# Patient Record
Sex: Female | Born: 1940 | Race: White | Hispanic: No | Marital: Married | Smoking: Former smoker
Health system: Southern US, Community
[De-identification: ages and names within clinical notes are randomized; demographics above are authoritative.]

## PROBLEM LIST (undated history)

## (undated) DIAGNOSIS — N390 Urinary tract infection, site not specified: Secondary | ICD-10-CM

## (undated) DIAGNOSIS — Z923 Personal history of irradiation: Secondary | ICD-10-CM

## (undated) DIAGNOSIS — B019 Varicella without complication: Secondary | ICD-10-CM

## (undated) DIAGNOSIS — K219 Gastro-esophageal reflux disease without esophagitis: Secondary | ICD-10-CM

## (undated) DIAGNOSIS — G4730 Sleep apnea, unspecified: Secondary | ICD-10-CM

## (undated) DIAGNOSIS — C801 Malignant (primary) neoplasm, unspecified: Secondary | ICD-10-CM

## (undated) DIAGNOSIS — M1990 Unspecified osteoarthritis, unspecified site: Secondary | ICD-10-CM

## (undated) DIAGNOSIS — B029 Zoster without complications: Secondary | ICD-10-CM

## (undated) HISTORY — PX: WISDOM TOOTH EXTRACTION: SHX21

## (undated) HISTORY — PX: HYSTEROSCOPY: SHX211

## (undated) HISTORY — DX: Urinary tract infection, site not specified: N39.0

## (undated) HISTORY — DX: Varicella without complication: B01.9

## (undated) HISTORY — DX: Zoster without complications: B02.9

## (undated) HISTORY — PX: COLONOSCOPY: SHX5424

## (undated) HISTORY — DX: Unspecified osteoarthritis, unspecified site: M19.90

---

## 2000-07-04 ENCOUNTER — Encounter: Admission: RE | Admit: 2000-07-04 | Discharge: 2000-07-04 | Payer: Self-pay | Admitting: Obstetrics and Gynecology

## 2000-07-04 ENCOUNTER — Encounter: Payer: Self-pay | Admitting: Obstetrics and Gynecology

## 2000-10-08 ENCOUNTER — Other Ambulatory Visit: Admission: RE | Admit: 2000-10-08 | Discharge: 2000-10-08 | Payer: Self-pay | Admitting: Obstetrics and Gynecology

## 2001-06-20 LAB — HM DEXA SCAN

## 2001-07-08 ENCOUNTER — Encounter: Admission: RE | Admit: 2001-07-08 | Discharge: 2001-07-08 | Payer: Self-pay | Admitting: Obstetrics and Gynecology

## 2001-07-08 ENCOUNTER — Encounter: Payer: Self-pay | Admitting: Obstetrics and Gynecology

## 2001-09-30 ENCOUNTER — Other Ambulatory Visit: Admission: RE | Admit: 2001-09-30 | Discharge: 2001-09-30 | Payer: Self-pay | Admitting: Internal Medicine

## 2002-07-15 ENCOUNTER — Encounter: Admission: RE | Admit: 2002-07-15 | Discharge: 2002-07-15 | Payer: Self-pay | Admitting: Gynecology

## 2002-07-15 ENCOUNTER — Encounter: Payer: Self-pay | Admitting: Gynecology

## 2002-07-21 DIAGNOSIS — B029 Zoster without complications: Secondary | ICD-10-CM

## 2002-07-21 HISTORY — DX: Zoster without complications: B02.9

## 2002-10-08 ENCOUNTER — Other Ambulatory Visit: Admission: RE | Admit: 2002-10-08 | Discharge: 2002-10-08 | Payer: Self-pay | Admitting: Gynecology

## 2003-04-07 ENCOUNTER — Ambulatory Visit (HOSPITAL_COMMUNITY): Admission: RE | Admit: 2003-04-07 | Discharge: 2003-04-07 | Payer: Self-pay | Admitting: Internal Medicine

## 2003-07-17 ENCOUNTER — Encounter: Admission: RE | Admit: 2003-07-17 | Discharge: 2003-07-17 | Payer: Self-pay | Admitting: Gynecology

## 2003-07-17 ENCOUNTER — Encounter: Payer: Self-pay | Admitting: Gynecology

## 2003-10-13 ENCOUNTER — Other Ambulatory Visit: Admission: RE | Admit: 2003-10-13 | Discharge: 2003-10-13 | Payer: Self-pay | Admitting: Gynecology

## 2004-07-19 ENCOUNTER — Ambulatory Visit (HOSPITAL_COMMUNITY): Admission: RE | Admit: 2004-07-19 | Discharge: 2004-07-19 | Payer: Self-pay | Admitting: Gynecology

## 2004-10-17 ENCOUNTER — Other Ambulatory Visit: Admission: RE | Admit: 2004-10-17 | Discharge: 2004-10-17 | Payer: Self-pay | Admitting: Gynecology

## 2005-08-01 ENCOUNTER — Ambulatory Visit (HOSPITAL_COMMUNITY): Admission: RE | Admit: 2005-08-01 | Discharge: 2005-08-01 | Payer: Self-pay | Admitting: Gynecology

## 2005-09-13 ENCOUNTER — Ambulatory Visit (HOSPITAL_COMMUNITY): Admission: RE | Admit: 2005-09-13 | Discharge: 2005-09-13 | Payer: Self-pay | Admitting: Gynecology

## 2005-10-25 ENCOUNTER — Other Ambulatory Visit: Admission: RE | Admit: 2005-10-25 | Discharge: 2005-10-25 | Payer: Self-pay | Admitting: Gynecology

## 2006-09-17 ENCOUNTER — Ambulatory Visit (HOSPITAL_COMMUNITY): Admission: RE | Admit: 2006-09-17 | Discharge: 2006-09-17 | Payer: Self-pay | Admitting: Gynecology

## 2006-10-30 ENCOUNTER — Other Ambulatory Visit: Admission: RE | Admit: 2006-10-30 | Discharge: 2006-10-30 | Payer: Self-pay | Admitting: Gynecology

## 2007-09-19 ENCOUNTER — Ambulatory Visit (HOSPITAL_COMMUNITY): Admission: RE | Admit: 2007-09-19 | Discharge: 2007-09-19 | Payer: Self-pay | Admitting: Gynecology

## 2007-11-05 ENCOUNTER — Other Ambulatory Visit: Admission: RE | Admit: 2007-11-05 | Discharge: 2007-11-05 | Payer: Self-pay | Admitting: Gynecology

## 2008-05-28 ENCOUNTER — Ambulatory Visit (HOSPITAL_BASED_OUTPATIENT_CLINIC_OR_DEPARTMENT_OTHER): Admission: RE | Admit: 2008-05-28 | Discharge: 2008-05-28 | Payer: Self-pay | Admitting: Gynecology

## 2008-05-28 ENCOUNTER — Encounter (INDEPENDENT_AMBULATORY_CARE_PROVIDER_SITE_OTHER): Payer: Self-pay | Admitting: Gynecology

## 2008-10-06 ENCOUNTER — Ambulatory Visit (HOSPITAL_COMMUNITY): Admission: RE | Admit: 2008-10-06 | Discharge: 2008-10-06 | Payer: Self-pay | Admitting: Gynecology

## 2009-10-12 ENCOUNTER — Ambulatory Visit (HOSPITAL_COMMUNITY): Admission: RE | Admit: 2009-10-12 | Discharge: 2009-10-12 | Payer: Self-pay | Admitting: Gynecology

## 2010-01-07 ENCOUNTER — Ambulatory Visit (HOSPITAL_COMMUNITY): Admission: RE | Admit: 2010-01-07 | Discharge: 2010-01-07 | Payer: Self-pay | Admitting: Orthopaedic Surgery

## 2010-10-20 ENCOUNTER — Ambulatory Visit (HOSPITAL_COMMUNITY)
Admission: RE | Admit: 2010-10-20 | Discharge: 2010-10-20 | Payer: Self-pay | Source: Home / Self Care | Admitting: Gynecology

## 2010-12-20 ENCOUNTER — Other Ambulatory Visit: Payer: Self-pay | Admitting: Gynecology

## 2011-04-04 NOTE — Op Note (Signed)
NAMERosalio [name], [name]              ACCOUNT NO.:  [id number]   MEDICAL RECORD NO.:  [id number]          PATIENT TYPE:  AMB   LOCATION:  [facility]                         FACILITY:  [facility]   PHYSICIAN:  Gretta Cool, M.D. DATE OF BIRTH:  [birth year]   DATE OF PROCEDURE:  05/28/2008  DATE OF DISCHARGE:                               OPERATIVE REPORT   PREOPERATIVE DIAGNOSIS:  Endometrial polyps, with abnormal uterine  bleeding.   POSTOPERATIVE DIAGNOSIS:  Endometrial polyps, with abnormal uterine  bleeding.   PROCEDURE:  Hysteroscopy, resection of endometrial polyps, total  endometrial resection per ablation plus VaporTrode ablation.   ANESTHESIA:  IV sedation, and paracervical block.   BRIEF HISTORY:  A 70 year old white female, postmenopausal, with  postmenopausal bleeding and endometrial thickening by ultrasound.  She  had office endometrial sampling that revealed no evidence of endometrial  cancer.  She now presents for hysteroscopy and resection of endometrial  polyps and total endometrial resection for ablation.   DESCRIPTION OF PROCEDURE:  Under excellent anesthesia as above, with the  patient prepped and draped in lithotomy position in [place] stirrups, her  bladder was drained with in-and-out catheter.  Her cervix was then  grasped with a single-tooth tenaculum and progressively dilated to  accommodate the 7 mm resectoscope.  The resectoscope was then introduced  and the cavity photographed.  There were two areas of polyp-like  formation, one on the anterior wall and one down near the cervical  opening.  Those areas were resected first and submitted.  The entire  endometrial cavity was then systematically resected.  There was evidence  of significant extension of endometrium out into the superficial  myometrium.  The resection was carried deeply, and then VaporTrode  applied to eliminate any further islands of endometrial tissue in the  superficial myometrium.  The entire  cavity was treated with the  VaporTrode, the cornual areas by touch technique.  At the end of the  procedure, the entire cavity had been treated completely.  There were no  complications.  The patient returned to the recovery room in excellent  condition.  No significant blood loss.  No complications.           ______________________________  Gretta Cool, M.D.     CWL/MEDQ  D:  05/28/2008  T:  05/28/2008  Job:  [id number]

## 2011-04-07 NOTE — Op Note (Signed)
   NAME:  Elizabeth [name]                        ACCOUNT NO.:  [id number]   MEDICAL RECORD NO.:  [id number]                   PATIENT TYPE:  AMB   LOCATION:  DAY                                  FACILITY:  [facility]   PHYSICIAN:  R. Roetta Sessions, M.[name].              DATE OF BIRTH:  [birth year]   DATE OF PROCEDURE:  04/07/2003  DATE OF DISCHARGE:                                 OPERATIVE REPORT   PROCEDURE:  Screening colonoscopy.   INDICATIONS FOR PROCEDURE:  The patient is a 70 year old lady devoid of any  lower GI tract symptoms, no prior colon imaging, no family history of  colorectal neoplasia who is referred for colorectal cancer screening.  Colonoscopy is now being done as a standard screening maneuver. This  approach has been discussed with the patient at length at the bedside. The  potential risks, benefits, and alternatives have been reviewed, questions  answered. Please see my handwritten H&P for more information.   MONITORING:  O2 saturation, blood pressure, pulse and respirations were  monitored throughout the entire procedure.   CONSCIOUS SEDATION:  Versed 3 mg IV, Demerol 75 mg IV in divided doses.   INSTRUMENT:  Olympus video chip pediatric colonoscope.   FINDINGS:  Digital rectal exam revealed no abnormalities.   ENDOSCOPIC FINDINGS:  The prep was excellent.   RECTUM:  Examination of the rectal mucosa including retroflexed view of the  anal verge revealed no abnormalities.   COLON:  The colonic mucosa was surveyed from the rectosigmoid junction  through the left transverse right colon to the area of the appendiceal  orifice, ileocecal valve and cecum. These structures were well seen and  photographed for the record. The colonic mucosa all the way to the cecum  appeared normal. From the level of the cecum and ileocecal valve, the scope  was slowly and cautiously withdrawn and all previously mentioned mucosal  surfaces were again seen and again no other abnormalities  were observed. The  patient tolerated the procedure well and was reacted in endoscopy.    IMPRESSION:  1. Normal rectum.  2. Normal colon.   RECOMMENDATIONS:  Repeat colonoscopy in 10 years.                                               Elizabeth [name], M.[name].    RMR/MEDQ  [name]:  04/07/2003  T:  04/07/2003  Job:  [id number]   cc:   Elizabeth [name], M.[name].  [address]  [place]  [place] [address]  Fax: [phone]

## 2011-10-10 ENCOUNTER — Other Ambulatory Visit: Payer: Self-pay | Admitting: Gynecology

## 2011-10-10 DIAGNOSIS — Z1231 Encounter for screening mammogram for malignant neoplasm of breast: Secondary | ICD-10-CM

## 2011-11-03 ENCOUNTER — Ambulatory Visit
Admission: RE | Admit: 2011-11-03 | Discharge: 2011-11-03 | Disposition: A | Discharge status: Home / Self Care | Payer: PRIVATE HEALTH INSURANCE | Source: Ambulatory Visit | Attending: Gynecology | Admitting: Gynecology

## 2011-11-03 DIAGNOSIS — Z1231 Encounter for screening mammogram for malignant neoplasm of breast: Secondary | ICD-10-CM

## 2012-01-09 ENCOUNTER — Other Ambulatory Visit: Payer: Self-pay | Admitting: Gynecology

## 2012-10-15 ENCOUNTER — Other Ambulatory Visit: Payer: Self-pay | Admitting: Gynecology

## 2012-10-15 DIAGNOSIS — Z1231 Encounter for screening mammogram for malignant neoplasm of breast: Secondary | ICD-10-CM

## 2012-11-20 LAB — HM MAMMOGRAPHY: HM Mammogram: NORMAL

## 2012-11-25 ENCOUNTER — Ambulatory Visit
Admission: RE | Admit: 2012-11-25 | Discharge: 2012-11-25 | Disposition: A | Discharge status: Home / Self Care | Payer: Medicare Other | Source: Ambulatory Visit | Attending: Gynecology | Admitting: Gynecology

## 2012-11-25 DIAGNOSIS — Z1231 Encounter for screening mammogram for malignant neoplasm of breast: Secondary | ICD-10-CM

## 2012-12-26 ENCOUNTER — Encounter: Payer: Self-pay | Admitting: Family Medicine

## 2012-12-26 ENCOUNTER — Ambulatory Visit (INDEPENDENT_AMBULATORY_CARE_PROVIDER_SITE_OTHER): Payer: Medicare Other | Admitting: Family Medicine

## 2012-12-26 VITALS — BP 128/80 | HR 72 | Temp 97.8°F | Ht 67.25 in | Wt 145.8 lb

## 2012-12-26 DIAGNOSIS — Z79890 Hormone replacement therapy (postmenopausal): Secondary | ICD-10-CM | POA: Insufficient documentation

## 2012-12-26 DIAGNOSIS — R739 Hyperglycemia, unspecified: Secondary | ICD-10-CM | POA: Insufficient documentation

## 2012-12-26 DIAGNOSIS — M549 Dorsalgia, unspecified: Secondary | ICD-10-CM

## 2012-12-26 DIAGNOSIS — G8929 Other chronic pain: Secondary | ICD-10-CM

## 2012-12-26 DIAGNOSIS — R7309 Other abnormal glucose: Secondary | ICD-10-CM

## 2012-12-26 NOTE — Patient Instructions (Addendum)
Schedule your complete physical for October Think of [place] as your home [place] Call with any questions or concerns Keep up the good work!  You look great! Welcome!  We're glad to have you!!!

## 2012-12-26 NOTE — Assessment & Plan Note (Signed)
New to provider.  Currently well controlled on diclofenac and w/ chiropractic and yoga.  Will follow.

## 2012-12-26 NOTE — Progress Notes (Signed)
  Subjective:    Patient ID: Elizabeth [name], female    DOB: 31-Aug-1941, 72 y.o.   MRN: [id number]  HPI New to establish.  Previous MD- Gerda Diss.  GYN- [place].  UTD on mammo and pap.  Colon cancer screening will be due later this year or next Sidney Ace, [place])  HRT- currently being prescribed by Dr Nicholas Lose, on Estrace Cream, Vivelle Dot, Prometrium.  Pt tried to wean and 'i did not have a good night sleep for 3 yrs'.  Elevated glucose- pt had 1 episode of elevated glucose on fasting labs.  Had glucose tolerance test w/ prior PCP- normal.  Will randomly check fasting sugars throughout the year, ranging 89-110.  + family hx of DM- mom.  Exercising regularly- 'at least 3 days/week'.  Back pain- chronic problem, controlled w/ Diclofenac and chiropractic.  Doing yoga twice weekly.  Pt has not had cholesterol or lab work done in 2 yrs- annual exams in Oct.   Review of Systems For ROS see HPI     Objective:   Physical Exam  Vitals reviewed. Constitutional: She is oriented to person, place, and time. She appears well-developed and well-nourished. No distress.  HENT:  Head: Normocephalic and atraumatic.  Eyes: Conjunctivae normal and EOM are normal. Pupils are equal, round, and reactive to light.  Neck: Normal range of motion. Neck supple. No thyromegaly present.  Cardiovascular: Normal rate, regular rhythm, normal heart sounds and intact distal pulses.   No murmur heard. Pulmonary/Chest: Effort normal and breath sounds normal. No respiratory distress.  Abdominal: Soft. She exhibits no distension. There is no tenderness.  Musculoskeletal: She exhibits no edema.  Lymphadenopathy:    She has no cervical adenopathy.  Neurological: She is alert and oriented to person, place, and time.  Skin: Skin is warm and dry.  Psychiatric: She has a normal mood and affect. Her behavior is normal.          Assessment & Plan:

## 2012-12-26 NOTE — Assessment & Plan Note (Signed)
New.  Pt being managed by GYN.  UTD on mammo.  Discussed potential of elevated lipids on HRT.  Will check at this year's CPE.

## 2012-12-26 NOTE — Assessment & Plan Note (Signed)
New to provider.  Pt has been monitoring CBGs randomly since labs first elevated.  According to pt's home readings, this is well controlled.  Check labs to assess.  Discussed importance of low carb diet and continued exercise.  Will follow.

## 2013-01-27 ENCOUNTER — Other Ambulatory Visit: Payer: Self-pay | Admitting: Gynecology

## 2013-04-03 ENCOUNTER — Telehealth: Payer: Self-pay

## 2013-04-03 NOTE — Telephone Encounter (Signed)
Pt called and said that she received a letter to call and schedule next colonoscopy. She said she has remarried and moved to [facility] and she will be following up there with GI. She said Thanks for all of the care here.

## 2013-04-03 NOTE — Telephone Encounter (Signed)
Noted  

## 2013-09-11 ENCOUNTER — Encounter: Payer: Medicare Other | Admitting: Family Medicine

## 2013-10-07 ENCOUNTER — Telehealth: Payer: Self-pay

## 2013-10-07 NOTE — Telephone Encounter (Addendum)
Left message for call back Non identifiable Medication and allergies: reviewed and updated  90 day supply/mail order: na [facility] pharmacy: [facility] [address]   Immunizations due: UTD    A/P:   No changes to FH or [facility]  Flu--08/2013 Shingles--07/2008 Pap--neg--01/2013 MMG--11/2012--neg Bone Density--06/2001  To Discuss with Provider: Due for CCS--referral please

## 2013-10-08 ENCOUNTER — Encounter: Payer: Self-pay | Admitting: Family Medicine

## 2013-10-08 ENCOUNTER — Ambulatory Visit (INDEPENDENT_AMBULATORY_CARE_PROVIDER_SITE_OTHER): Payer: Medicare Other | Admitting: Family Medicine

## 2013-10-08 ENCOUNTER — Other Ambulatory Visit: Payer: Self-pay | Admitting: General Practice

## 2013-10-08 VITALS — BP 126/80 | HR 70 | Temp 97.9°F | Resp 16 | Ht 67.0 in | Wt 142.4 lb

## 2013-10-08 DIAGNOSIS — Z79890 Hormone replacement therapy (postmenopausal): Secondary | ICD-10-CM

## 2013-10-08 DIAGNOSIS — Z0000 Encounter for general adult medical examination without abnormal findings: Secondary | ICD-10-CM

## 2013-10-08 DIAGNOSIS — Z23 Encounter for immunization: Secondary | ICD-10-CM

## 2013-10-08 DIAGNOSIS — Z1211 Encounter for screening for malignant neoplasm of colon: Secondary | ICD-10-CM

## 2013-10-08 DIAGNOSIS — R7309 Other abnormal glucose: Secondary | ICD-10-CM

## 2013-10-08 DIAGNOSIS — R739 Hyperglycemia, unspecified: Secondary | ICD-10-CM

## 2013-10-08 LAB — BASIC METABOLIC PANEL
BUN: 9 mg/dL (ref 6–23)
CO2: 26 mEq/L (ref 19–32)
Calcium: 9.7 mg/dL (ref 8.4–10.5)
Chloride: 103 mEq/L (ref 96–112)
Creatinine, Ser: 0.8 mg/dL (ref 0.4–1.2)
GFR: 77.42 mL/min (ref >60.00)
Glucose, Bld: 112 mg/dL — ABNORMAL HIGH (ref 70–99)
Potassium: 3.8 mEq/L (ref 3.5–5.1)
Sodium: 134 mEq/L — ABNORMAL LOW (ref 135–145)

## 2013-10-08 LAB — CBC WITH DIFFERENTIAL/PLATELET
Eosinophils Absolute: 0.1 10*3/uL (ref 0.0–0.7)
Hemoglobin: 12.8 g/dL (ref 12.0–15.0)
Lymphs Abs: 0.8 10*3/uL (ref 0.7–4.0)
MCHC: 34.3 g/dL (ref 30.0–36.0)
MCV: 95.3 fl (ref 78.0–100.0)
Monocytes Absolute: 0.4 10*3/uL (ref 0.1–1.0)
Monocytes Relative: 9 % (ref 3.0–12.0)
Neutro Abs: 3.2 10*3/uL (ref 1.4–7.7)
RBC: 3.93 Mil/uL (ref 3.87–5.11)
RDW: 12.9 % (ref 11.5–14.6)
WBC: 4.5 10*3/uL (ref 4.5–10.5)

## 2013-10-08 LAB — HEPATIC FUNCTION PANEL
ALT: 15 U/L (ref 0–35)
AST: 17 U/L (ref 0–37)
Alkaline Phosphatase: 50 U/L (ref 39–117)
Total Bilirubin: 0.6 mg/dL (ref 0.3–1.2)

## 2013-10-08 LAB — LIPID PANEL
Cholesterol: 190 mg/dL (ref 0–200)
HDL: 76.4 mg/dL (ref >39.00)
LDL Cholesterol: 97 mg/dL (ref 0–99)
Total CHOL/HDL Ratio: 2
Triglycerides: 83 mg/dL (ref 0.0–149.0)
VLDL: 16.6 mg/dL (ref 0.0–40.0)

## 2013-10-08 MED ORDER — DICLOFENAC SODIUM 75 MG PO TBEC: 75.0000 mg | DELAYED_RELEASE_TABLET | Freq: Every day | ORAL | Status: DC

## 2013-10-08 NOTE — Assessment & Plan Note (Signed)
Check labs.  Treat prn. 

## 2013-10-08 NOTE — Assessment & Plan Note (Signed)
Pt UTD on mammo.  Check labs as meds can elevate lipids and sugar.  Will continue to follow.

## 2013-10-08 NOTE — Addendum Note (Signed)
Addended by: Jackson Latino on: 10/08/2013 10:53 AM   Modules accepted: Orders

## 2013-10-08 NOTE — Progress Notes (Signed)
  Subjective:    Patient ID: Elizabeth [name], female    DOB: 03/24/1941, 71 y.o.   MRN: [id number]  HPI Pre visit review using our clinic review tool, if applicable. No additional management support is needed unless otherwise documented below in the visit note.   Here today for CPE.  Risk Factors: HRT- chronic for pt, initiated by GYN.  UTD on mammo.   Physical Activity: exercising regularly, does treadmill and elliptical 3x/week, yoga Fall Risk: low risk Depression: no current sxs Hearing: normal to conversational tones and whispered voice w/ hearing aides ADL's: independent Cognitive: normal linear thought process, memory and attention intact Home Safety: safe at home Height, Weight, BMI, Visual Acuity: see vitals, vision corrected to 20/20 w/ glasses Counseling: UTD on mammo [facility]), due for colonoscopy, no longer needs paps (had pap in March), due for DEXA. Labs Ordered: See A&P Care Plan: See A&P    Review of Systems Patient reports no vision/ hearing changes, adenopathy,fever, weight change,  persistant/recurrent hoarseness , swallowing issues, chest pain, palpitations, edema, persistant/recurrent cough, hemoptysis, dyspnea (rest/exertional/paroxysmal nocturnal), gastrointestinal bleeding (melena, rectal bleeding), abdominal pain, significant heartburn, bowel changes, GU symptoms (dysuria, hematuria, incontinence), Gyn symptoms (abnormal  bleeding, pain),  syncope, focal weakness, memory loss, numbness & tingling, skin/hair/nail changes, abnormal bruising or bleeding, anxiety, or depression.     Objective:   Physical Exam General Appearance:    Alert, cooperative, no distress, appears stated age  Head:    Normocephalic, without obvious abnormality, atraumatic  Eyes:    PERRL, conjunctiva/corneas clear, EOM's intact, fundi    benign, both eyes  Ears:    Normal TM's and external ear canals, both ears  Nose:   Nares normal, septum midline, mucosa normal, no  drainage    or sinus tenderness  Throat:   Lips, mucosa, and tongue normal; teeth and gums normal  Neck:   Supple, symmetrical, trachea midline, no adenopathy;    Thyroid: no enlargement/tenderness/nodules  Back:     Symmetric, no curvature, ROM normal, no CVA tenderness  Lungs:     Clear to auscultation bilaterally, respirations unlabored  Chest Wall:    No tenderness or deformity   Heart:    Regular rate and rhythm, S1 and S2 normal, no murmur, rub   or gallop  Breast Exam:    Deferred to mammo  Abdomen:     Soft, non-tender, bowel sounds active all four quadrants,    no masses, no organomegaly  Genitalia:    Deferred to GYN  Rectal:    Extremities:   Extremities normal, atraumatic, no cyanosis or edema  Pulses:   2+ and symmetric all extremities  Skin:   Skin color, texture, turgor normal, no rashes or lesions  Lymph nodes:   Cervical, supraclavicular, and axillary nodes normal  Neurologic:   CNII-XII intact, normal strength, sensation and reflexes    throughout          Assessment & Plan:

## 2013-10-08 NOTE — Assessment & Plan Note (Signed)
Pt's PE WNL.  UTD on mammo.  Due for DEXA and colonoscopy- referrals made.  Check labs.  EKG done- see document for interpretation.  Anticipatory guidance provided.

## 2013-10-08 NOTE — Patient Instructions (Addendum)
Follow up in 1 year or as needed We'll notify you of your lab results and make any changes if needed Keep up the good work!  You look great! We'll call you with your bone density and GI appts Call with any questions or concerns Happy Holidays!

## 2013-10-09 ENCOUNTER — Other Ambulatory Visit: Payer: Self-pay | Admitting: Family Medicine

## 2013-10-09 ENCOUNTER — Ambulatory Visit: Payer: Medicare Other

## 2013-10-09 DIAGNOSIS — R7309 Other abnormal glucose: Secondary | ICD-10-CM

## 2013-10-09 DIAGNOSIS — E2839 Other primary ovarian failure: Secondary | ICD-10-CM

## 2013-10-09 LAB — HEMOGLOBIN A1C
Hgb A1c MFr Bld: 4.1 % (ref <5.7)
Mean Plasma Glucose: 71 mg/dL (ref <117)

## 2013-10-13 ENCOUNTER — Other Ambulatory Visit: Payer: Self-pay | Admitting: Family Medicine

## 2013-10-13 DIAGNOSIS — Z1231 Encounter for screening mammogram for malignant neoplasm of breast: Secondary | ICD-10-CM

## 2013-11-06 ENCOUNTER — Encounter: Payer: Self-pay | Admitting: Internal Medicine

## 2013-11-24 ENCOUNTER — Other Ambulatory Visit: Payer: Self-pay | Admitting: Family Medicine

## 2013-11-24 ENCOUNTER — Other Ambulatory Visit: Payer: Self-pay

## 2013-11-24 DIAGNOSIS — E2839 Other primary ovarian failure: Secondary | ICD-10-CM

## 2013-11-26 ENCOUNTER — Ambulatory Visit
Admission: RE | Admit: 2013-11-26 | Discharge: 2013-11-26 | Disposition: A | Discharge status: Home / Self Care | Payer: Medicare Other | Source: Ambulatory Visit | Attending: Family Medicine | Admitting: Family Medicine

## 2013-11-26 ENCOUNTER — Encounter: Payer: Self-pay | Admitting: General Practice

## 2013-11-26 DIAGNOSIS — E2839 Other primary ovarian failure: Secondary | ICD-10-CM

## 2013-11-26 DIAGNOSIS — Z1231 Encounter for screening mammogram for malignant neoplasm of breast: Secondary | ICD-10-CM

## 2013-11-26 LAB — HM DEXA SCAN: HM DEXA SCAN: NORMAL

## 2013-12-02 ENCOUNTER — Other Ambulatory Visit: Payer: Self-pay | Admitting: Family Medicine

## 2013-12-02 DIAGNOSIS — R928 Other abnormal and inconclusive findings on diagnostic imaging of breast: Secondary | ICD-10-CM

## 2013-12-08 ENCOUNTER — Other Ambulatory Visit: Payer: Self-pay

## 2013-12-08 ENCOUNTER — Other Ambulatory Visit: Payer: Self-pay | Admitting: Family Medicine

## 2013-12-08 DIAGNOSIS — B351 Tinea unguium: Secondary | ICD-10-CM

## 2013-12-08 DIAGNOSIS — R928 Other abnormal and inconclusive findings on diagnostic imaging of breast: Secondary | ICD-10-CM

## 2013-12-09 ENCOUNTER — Ambulatory Visit
Admission: RE | Admit: 2013-12-09 | Discharge: 2013-12-09 | Disposition: A | Discharge status: Home / Self Care | Payer: Medicare Other | Source: Ambulatory Visit | Attending: Family Medicine | Admitting: Family Medicine

## 2013-12-09 DIAGNOSIS — R928 Other abnormal and inconclusive findings on diagnostic imaging of breast: Secondary | ICD-10-CM

## 2013-12-22 ENCOUNTER — Ambulatory Visit (AMBULATORY_SURGERY_CENTER): Payer: Self-pay

## 2013-12-22 VITALS — Ht 67.25 in | Wt 144.0 lb

## 2013-12-22 DIAGNOSIS — Z1211 Encounter for screening for malignant neoplasm of colon: Secondary | ICD-10-CM

## 2013-12-22 MED ORDER — MOVIPREP 100 G PO SOLR
1.0000 | Freq: Once | ORAL | Status: DC
Start: 2013-12-22 — End: 2014-01-05

## 2013-12-25 ENCOUNTER — Encounter: Payer: Self-pay | Admitting: Internal Medicine

## 2014-01-05 ENCOUNTER — Ambulatory Visit (AMBULATORY_SURGERY_CENTER): Payer: Medicare Other | Admitting: Internal Medicine

## 2014-01-05 ENCOUNTER — Encounter: Payer: Self-pay | Admitting: Internal Medicine

## 2014-01-05 VITALS — BP 122/84 | HR 66 | Temp 97.5°F | Resp 23 | Ht 67.0 in | Wt 144.0 lb

## 2014-01-05 DIAGNOSIS — Z1211 Encounter for screening for malignant neoplasm of colon: Secondary | ICD-10-CM

## 2014-01-05 MED ORDER — SODIUM CHLORIDE 0.9 % IV SOLN: 500.0000 mL | INTRAVENOUS | Status: DC

## 2014-01-05 NOTE — Progress Notes (Signed)
Procedure ends, to recovery, report given and VSS. 

## 2014-01-05 NOTE — Patient Instructions (Signed)
YOU HAD AN ENDOSCOPIC PROCEDURE TODAY AT [facility]: Refer to the procedure report that was given to you for any specific questions about what was found during the examination.  If the procedure report does not answer your questions, please call your gastroenterologist to clarify.  If you requested that your care partner not be given the details of your procedure findings, then the procedure report has been included in a sealed envelope for you to review at your convenience later.  YOU SHOULD EXPECT: Some feelings of bloating in the abdomen. Passage of more gas than usual.  Walking can help get rid of the air that was put into your GI tract during the procedure and reduce the bloating. If you had a lower endoscopy (such as a colonoscopy or flexible sigmoidoscopy) you may notice spotting of blood in your stool or on the toilet paper. If you underwent a bowel prep for your procedure, then you may not have a normal bowel movement for a few days.  DIET: Your first meal following the procedure should be a light meal and then it is ok to progress to your normal diet.  A half-sandwich or bowl of soup is an example of a good first meal.  Heavy or fried foods are harder to digest and may make you feel nauseous or bloated.  Likewise meals heavy in dairy and vegetables can cause extra gas to form and this can also increase the bloating.  Drink plenty of fluids but you should avoid alcoholic beverages for 24 hours.  ACTIVITY: Your care partner should take you home directly after the procedure.  You should plan to take it easy, moving slowly for the rest of the day.  You can resume normal activity the day after the procedure however you should NOT DRIVE or use heavy machinery for 24 hours (because of the sedation medicines used during the test).    SYMPTOMS TO REPORT IMMEDIATELY: A gastroenterologist can be reached at any hour.  During normal business hours, 8:30 AM to 5:00 PM Monday through Friday,  call [phone].  After hours and on weekends, please call the GI answering service at [phone]  Emergency number who will take a message and have the physician on call contact you.   Following lower endoscopy (colonoscopy or flexible sigmoidoscopy):  Excessive amounts of blood in the stool  Significant tenderness or worsening of abdominal pains  Swelling of the abdomen that is new, acute  Fever of 100F or higher  FOLLOW UP: If any biopsies were taken you will be contacted by phone or by letter within the next 1-3 weeks.  Call your gastroenterologist if you have not heard about the biopsies in 3 weeks.  Our staff will call the home number listed on your records the next business day following your procedure to check on you and address any questions or concerns that you may have at that time regarding the information given to you following your procedure. This is a courtesy call and so if there is no answer at the home number and we have not heard from you through the emergency physician on call, we will assume that you have returned to your regular daily activities without incident.  SIGNATURES/CONFIDENTIALITY: You and/or your care partner have signed paperwork which will be entered into your electronic medical record.  These signatures attest to the fact that that the information above on your After Visit Summary has been reviewed and is understood.  Full responsibility of the  confidentiality of this discharge information lies with you and/or your care-partner.  Handout on diverticulosis , high fiber diet repeat colon in 10 years

## 2014-01-05 NOTE — Op Note (Signed)
[place]  [address] [place], [id number]   COLONOSCOPY PROCEDURE REPORT  PATIENT: Elizabeth [name], Elizabeth [name]  MR#: [id number] BIRTHDATE: [birth year] , 73  yrs. old GENDER: Female ENDOSCOPIST: Eustace Quail, MD REFERRED TG:GYIRSWNIO Birdie Riddle, M.D. PROCEDURE DATE:  01/05/2014 PROCEDURE:   Colonoscopy, screening First Screening Colonoscopy - Avg.  risk and is 50 yrs.  old or older - No.  Prior Negative Screening - Now for repeat screening. 10 or more years since last screening  History of Adenoma - Now for follow-up colonoscopy & has been > or = to 3 yrs.  N/A  Polyps Removed Today? No.  Recommend repeat exam, <10 yrs? No. ASA CLASS:   Class I INDICATIONS:average risk screening.   Index exam elsewhere 11 years ago without polyps. MEDICATIONS: MAC sedation, administered by CRNA and propofol (Diprivan) 200mg  IV  DESCRIPTION OF PROCEDURE:   After the risks benefits and alternatives of the procedure were thoroughly explained, informed consent was obtained.  A digital rectal exam revealed no abnormalities of the rectum.   The LB [id number]  endoscope was introduced through the anus and advanced to the cecum, which was identified by both the appendix and ileocecal valve. No adverse events experienced.   The quality of the prep was excellent, using MoviPrep  The instrument was then slowly withdrawn as the colon was fully examined.      COLON FINDINGS: Moderate diverticulosis was noted The finding was in the left colon.   The colon was otherwise normal.  There was no inflammation, polyps or cancers .  Retroflexed views revealed internal hemorrhoids. The time to cecum=3 minutes 16 seconds. Withdrawal time=8 minutes 20 seconds.  The scope was withdrawn and the procedure completed.  COMPLICATIONS: There were no complications.  ENDOSCOPIC IMPRESSION: 1.   Moderate diverticulosis was noted in the left colon 2.   The colon was otherwise  normal  RECOMMENDATIONS: 1. Continue current colorectal screening recommendations for "routine risk" patients with a repeat colonoscopy in 10 years.   eSigned:  Eustace Quail, MD 01/05/2014 10:50 AM   cc: Midge Minium, MD and The Patient

## 2014-01-06 ENCOUNTER — Telehealth: Payer: Self-pay | Admitting: *Deleted

## 2014-01-06 NOTE — Telephone Encounter (Signed)
  Follow up Call-  Call back number 01/05/2014  Post procedure Call Back phone  # [phone]  Permission to leave phone message Yes     Patient questions:  Do you have a fever, pain , or abdominal swelling? no Pain Score  0 *  Have you tolerated food without any problems? yes  Have you been able to return to your normal activities? yes  Do you have any questions about your discharge instructions: Diet   no Medications  no Follow up visit  no  Do you have questions or concerns about your Care? no  Actions: * If pain score is 4 or above: No action needed, pain <4.

## 2014-01-13 ENCOUNTER — Encounter: Payer: Self-pay | Admitting: General Practice

## 2014-04-14 DIAGNOSIS — B351 Tinea unguium: Secondary | ICD-10-CM

## 2014-05-05 ENCOUNTER — Other Ambulatory Visit: Payer: Self-pay | Admitting: Family Medicine

## 2014-05-05 ENCOUNTER — Other Ambulatory Visit: Payer: Self-pay

## 2014-05-05 DIAGNOSIS — N6489 Other specified disorders of breast: Secondary | ICD-10-CM

## 2014-06-08 ENCOUNTER — Ambulatory Visit
Admission: RE | Admit: 2014-06-08 | Discharge: 2014-06-08 | Disposition: A | Discharge status: Home / Self Care | Payer: Medicare Other | Source: Ambulatory Visit | Attending: Family Medicine | Admitting: Family Medicine

## 2014-06-08 DIAGNOSIS — N6489 Other specified disorders of breast: Secondary | ICD-10-CM

## 2014-07-21 DIAGNOSIS — B351 Tinea unguium: Secondary | ICD-10-CM

## 2014-10-07 ENCOUNTER — Other Ambulatory Visit: Payer: Self-pay | Admitting: Family Medicine

## 2014-11-05 ENCOUNTER — Other Ambulatory Visit: Payer: Self-pay | Admitting: Family Medicine

## 2014-11-05 DIAGNOSIS — N6489 Other specified disorders of breast: Secondary | ICD-10-CM

## 2014-11-16 ENCOUNTER — Encounter: Payer: Medicare Other | Admitting: Family Medicine

## 2014-11-23 ENCOUNTER — Other Ambulatory Visit: Payer: Self-pay | Admitting: Family Medicine

## 2014-11-23 ENCOUNTER — Other Ambulatory Visit: Payer: Self-pay

## 2014-11-23 DIAGNOSIS — N6489 Other specified disorders of breast: Secondary | ICD-10-CM

## 2014-11-27 ENCOUNTER — Ambulatory Visit
Admission: RE | Admit: 2014-11-27 | Discharge: 2014-11-27 | Disposition: A | Discharge status: Home / Self Care | Payer: Medicare Other | Source: Ambulatory Visit | Attending: Family Medicine | Admitting: Family Medicine

## 2014-11-27 DIAGNOSIS — N6489 Other specified disorders of breast: Secondary | ICD-10-CM

## 2014-12-17 ENCOUNTER — Encounter: Payer: Self-pay | Admitting: Family Medicine

## 2014-12-17 ENCOUNTER — Ambulatory Visit (INDEPENDENT_AMBULATORY_CARE_PROVIDER_SITE_OTHER): Payer: Medicare Other | Admitting: Family Medicine

## 2014-12-17 VITALS — BP 130/78 | HR 74 | Temp 98.0°F | Resp 16 | Ht 67.0 in | Wt 150.1 lb

## 2014-12-17 DIAGNOSIS — Z0000 Encounter for general adult medical examination without abnormal findings: Secondary | ICD-10-CM

## 2014-12-17 DIAGNOSIS — Z23 Encounter for immunization: Secondary | ICD-10-CM

## 2014-12-17 NOTE — Patient Instructions (Signed)
Follow up in 1 year or as needed We'll notify you of your lab results and make any changes if needed Keep up the good work!  You look great! Call with any questions or concerns Happy Bel[employer] Birthday!!! 

## 2014-12-17 NOTE — Assessment & Plan Note (Signed)
Pt is remarkably healthy for her age.  PE WNL.  UTD on health maintenance.  Check labs.  Anticipatory guidance provided.

## 2014-12-17 NOTE — Progress Notes (Signed)
   Subjective:    Patient ID: Elizabeth [name], female    DOB: [birth year], 74 y.o.   MRN: [id number]  HPI Here today for CPE.  Risk Factors: none Physical Activity: exercising regularly Fall Risk: low risk Depression: denies current sxs Hearing: normal to conversational tones w/ hearing aides ADL's: independent Cognitive: normal linear thought process, memory and attention intact Home Safety: safe at home Height, Weight, BMI, Visual Acuity: see vitals, vision corrected to 20/20 w/ glasses Counseling: UTD on colonoscopy, mammo, DEXA.  No need for pap. Labs Ordered: See A&P Care Plan: See A&P    Review of Systems Patient reports no vision/ hearing changes, adenopathy,fever, weight change,  persistant/recurrent hoarseness , swallowing issues, chest pain, palpitations, edema, persistant/recurrent cough, hemoptysis, dyspnea (rest/exertional/paroxysmal nocturnal), gastrointestinal bleeding (melena, rectal bleeding), abdominal pain, significant heartburn, bowel changes, GU symptoms (dysuria, hematuria, incontinence), Gyn symptoms (abnormal  bleeding, pain),  syncope, focal weakness, memory loss, numbness & tingling, skin/hair/nail changes, abnormal bruising or bleeding, anxiety, or depression.     Objective:   Physical Exam General Appearance:    Alert, cooperative, no distress, appears stated age  Head:    Normocephalic, without obvious abnormality, atraumatic  Eyes:    PERRL, conjunctiva/corneas clear, EOM's intact, fundi    benign, both eyes  Ears:    Normal TM's and external ear canals, both ears  Nose:   Nares normal, septum midline, mucosa normal, no drainage    or sinus tenderness  Throat:   Lips, mucosa, and tongue normal; teeth and gums normal  Neck:   Supple, symmetrical, trachea midline, no adenopathy;    Thyroid: no enlargement/tenderness/nodules  Back:     Symmetric, no curvature, ROM normal, no CVA tenderness  Lungs:     Clear to auscultation bilaterally, respirations  unlabored  Chest Wall:    No tenderness or deformity   Heart:    Regular rate and rhythm, S1 and S2 normal, no murmur, rub   or gallop  Breast Exam:    Deferred to mammo  Abdomen:     Soft, non-tender, bowel sounds active all four quadrants,    no masses, no organomegaly  Genitalia:    Deferred  Rectal:    Extremities:   Extremities normal, atraumatic, no cyanosis or edema  Pulses:   2+ and symmetric all extremities  Skin:   Skin color, texture, turgor normal, no rashes or lesions  Lymph nodes:   Cervical, supraclavicular, and axillary nodes normal  Neurologic:   CNII-XII intact, normal strength, sensation and reflexes    throughout          Assessment & Plan:

## 2014-12-17 NOTE — Progress Notes (Signed)
Pre visit review using our clinic review tool, if applicable. No additional management support is needed unless otherwise documented below in the visit note. 

## 2014-12-18 ENCOUNTER — Encounter: Payer: Self-pay | Admitting: General Practice

## 2014-12-18 LAB — CBC WITH DIFFERENTIAL/PLATELET
BASOS PCT: 0.3 % (ref 0.0–3.0)
Basophils Absolute: 0 10*3/uL (ref 0.0–0.1)
EOS PCT: 0.8 % (ref 0.0–5.0)
Eosinophils Absolute: 0.1 10*3/uL (ref 0.0–0.7)
HCT: 36.5 % (ref 36.0–46.0)
Hemoglobin: 12.3 g/dL (ref 12.0–15.0)
Lymphocytes Relative: 16.4 % (ref 12.0–46.0)
Lymphs Abs: 1.1 10*3/uL (ref 0.7–4.0)
MCHC: 33.8 g/dL (ref 30.0–36.0)
MCV: 93.7 fl (ref 78.0–100.0)
MONOS PCT: 6.7 % (ref 3.0–12.0)
Monocytes Absolute: 0.5 10*3/uL (ref 0.1–1.0)
NEUTROS ABS: 5.1 10*3/uL (ref 1.4–7.7)
Neutrophils Relative %: 75.8 % (ref 43.0–77.0)
Platelets: 310 10*3/uL (ref 150.0–400.0)
RBC: 3.89 Mil/uL (ref 3.87–5.11)
RDW: 12.7 % (ref 11.5–15.5)
WBC: 6.8 10*3/uL (ref 4.0–10.5)

## 2014-12-18 LAB — LIPID PANEL
CHOLESTEROL: 208 mg/dL — AB (ref 0–200)
HDL: 81.5 mg/dL (ref >39.00)
LDL Cholesterol: 105 mg/dL — ABNORMAL HIGH (ref 0–99)
NonHDL: 126.5
TRIGLYCERIDES: 109 mg/dL (ref 0.0–149.0)
Total CHOL/HDL Ratio: 3
VLDL: 21.8 mg/dL (ref 0.0–40.0)

## 2014-12-18 LAB — BASIC METABOLIC PANEL
BUN: 18 mg/dL (ref 6–23)
CO2: 25 mEq/L (ref 19–32)
Calcium: 9.7 mg/dL (ref 8.4–10.5)
Chloride: 100 mEq/L (ref 96–112)
Creatinine, Ser: 0.93 mg/dL (ref 0.40–1.20)
GFR: 62.99 mL/min (ref >60.00)
Glucose, Bld: 85 mg/dL (ref 70–99)
Potassium: 4 mEq/L (ref 3.5–5.1)
Sodium: 133 mEq/L — ABNORMAL LOW (ref 135–145)

## 2014-12-18 LAB — HEPATIC FUNCTION PANEL
ALK PHOS: 59 U/L (ref 39–117)
ALT: 13 U/L (ref 0–35)
AST: 17 U/L (ref 0–37)
Albumin: 4.2 g/dL (ref 3.5–5.2)
BILIRUBIN TOTAL: 0.6 mg/dL (ref 0.2–1.2)
Bilirubin, Direct: 0.1 mg/dL (ref 0.0–0.3)
Total Protein: 7.4 g/dL (ref 6.0–8.3)

## 2014-12-18 LAB — TSH: TSH: 2.32 u[IU]/mL (ref 0.35–4.50)

## 2014-12-18 LAB — VITAMIN D 25 HYDROXY (VIT D DEFICIENCY, FRACTURES): VITD: 38.36 ng/mL (ref 30.00–100.00)

## 2014-12-24 DIAGNOSIS — B351 Tinea unguium: Secondary | ICD-10-CM

## 2015-02-04 ENCOUNTER — Encounter: Payer: Self-pay | Admitting: Podiatry

## 2015-02-04 ENCOUNTER — Ambulatory Visit (INDEPENDENT_AMBULATORY_CARE_PROVIDER_SITE_OTHER): Payer: Medicare Other | Admitting: Podiatry

## 2015-02-04 VITALS — BP 115/71 | HR 74 | Resp 16

## 2015-02-04 DIAGNOSIS — B351 Tinea unguium: Secondary | ICD-10-CM | POA: Diagnosis not present

## 2015-02-04 NOTE — Progress Notes (Signed)
   Subjective:    Patient ID: AMI MALLY, female    DOB: 26-Aug-1941, 74 y.o.   MRN: [id number]  HPI Pt presents with bilateral nail fungus, interested in laser therapy if recommended, has used formula 3 BID   Review of Systems  All other systems reviewed and are negative.      Objective:   Physical Exam        Assessment & Plan:

## 2015-02-05 NOTE — Progress Notes (Signed)
Subjective:     Patient ID: Elizabeth [name], female   DOB: Dec 30, 1940, 74 y.o.   MRN: [id number]  HPI patient has yellow discoloration of the hallux nails left over right that she has been utilizing topical but would like to pursue different approach   Review of Systems     Objective:   Physical Exam Her vascular status intact with no muscle strength loss and noted to have yellow discoloration of the distal one half nailbed left and distal one quarter nailbed right with no proximal extension noted    Assessment:     Mycotic nail infection bilateral hallux nails    Plan:     Gaited patient on problem discussed treatment solutions and at this point I recommended laser which she is scheduled for.

## 2015-02-22 ENCOUNTER — Ambulatory Visit: Payer: Medicare Other | Admitting: Podiatry

## 2015-02-23 LAB — HM PAP SMEAR: HM PAP: NORMAL

## 2015-02-24 ENCOUNTER — Telehealth: Payer: Self-pay | Admitting: *Deleted

## 2015-02-24 NOTE — Telephone Encounter (Signed)
Received results from portable home apnea sleep test from Dr. Augustina Mood, [facility]. Forwarded records to Dr. Birdie Riddle. JG//CMA

## 2015-02-25 LAB — HM PAP SMEAR: HM Pap smear: NORMAL

## 2015-02-26 ENCOUNTER — Other Ambulatory Visit: Payer: Self-pay | Admitting: Family Medicine

## 2015-02-26 NOTE — Telephone Encounter (Signed)
Med filled.  

## 2015-03-01 ENCOUNTER — Ambulatory Visit: Payer: Medicare Other | Admitting: Podiatry

## 2015-03-01 NOTE — Telephone Encounter (Signed)
Pt also stated is leaving town tomorrow and is wanting to have the letter signed by today if possible. Thank you.

## 2015-03-01 NOTE — Telephone Encounter (Signed)
Pt came in office asking for Dr. Birdie Riddle to sign letter made by Dr. Augustina Mood, pt states if Dr. Birdie Riddle ok with letter to please sign and return to her.  Pt Elizabeth [name] tel [phone].

## 2015-03-01 NOTE — Telephone Encounter (Signed)
Called and lm for pt that form is ready for pick up. Form placed up front, copy sent for scanning. Jg//CMA

## 2015-03-02 ENCOUNTER — Encounter: Payer: Self-pay | Admitting: Family Medicine

## 2015-03-05 ENCOUNTER — Telehealth: Payer: Self-pay | Admitting: *Deleted

## 2015-03-05 ENCOUNTER — Encounter: Payer: Self-pay | Admitting: General Practice

## 2015-03-05 ENCOUNTER — Telehealth: Payer: Self-pay | Admitting: Family Medicine

## 2015-03-05 NOTE — Telephone Encounter (Addendum)
Pt states she is scheduled on Wednesday for laser, and leaving for [place] on Friday and would like to know if she can polish her toenails to wear sandals.  Dr. Paulla Dolly states pt can polish toenails after the laser, but for best results remove polish as soon as possible.  I left Dr. Mellody Drown recommendation on pt's voicemail.

## 2015-03-05 NOTE — Telephone Encounter (Signed)
Caller name: Darrick Penna from [place] office Relation to pt: Call back number: [phone] Fax: [phone] Pharmacy:  Reason for call:   Insurance is requesting notes from Dr. Birdie Riddle visit regarding sleep test.

## 2015-03-05 NOTE — Telephone Encounter (Signed)
Spoke with pt who gave the OK to fax results to Dr. Corky Sing office. Note written on fax sheet to inform that we have no notes only results. We never ordered the sleep study.

## 2015-03-05 NOTE — Telephone Encounter (Signed)
Ok to send records in media from home sleep test and from August[facility] Mood [facility], IF pt gives permission to do so.

## 2015-03-05 NOTE — Telephone Encounter (Signed)
Ok to send. We have records in media, you never actually have a full OV on just this.

## 2015-03-09 NOTE — Telephone Encounter (Signed)
yes

## 2015-03-10 ENCOUNTER — Ambulatory Visit (INDEPENDENT_AMBULATORY_CARE_PROVIDER_SITE_OTHER): Payer: Medicare Other | Admitting: Podiatry

## 2015-03-10 DIAGNOSIS — B351 Tinea unguium: Secondary | ICD-10-CM

## 2015-03-10 MED ORDER — TERBINAFINE HCL 250 MG PO TABS: 250.0000 mg | ORAL_TABLET | Freq: Every day | ORAL | Status: DC

## 2015-03-10 NOTE — Progress Notes (Signed)
Subjective:     Patient ID: Elizabeth [name], female   DOB: [birth year], 74 y.o.   MRN: [id number]  HPI patient presents for laser treatment of the big toenails and the second and fourth nail the right foot   Review of Systems     Objective:   Physical Exam Neurovascular status intact with nail disease noted of these for nails that is localized in nature    Assessment:     Mycotic nail infection bilateral    Plan:     Laser accomplished approximate 2000 pulses and also placed on pulse Lamisil therapy she will take one week therapy per month for the next 4 months 250 mg Lamisil and will let us know if she cannot tolerate

## 2015-04-26 ENCOUNTER — Ambulatory Visit (INDEPENDENT_AMBULATORY_CARE_PROVIDER_SITE_OTHER): Payer: Medicare Other | Admitting: Podiatry

## 2015-04-26 DIAGNOSIS — B351 Tinea unguium: Secondary | ICD-10-CM

## 2015-04-28 NOTE — Progress Notes (Signed)
Subjective:     Patient ID: Elizabeth [name], female   DOB: 1941-07-02, 74 y.o.   MRN: [id number]  HPI patient feels her nails are improving   Review of Systems     Objective:   Physical Exam Neurovascular status intact with several nails are discolored they do appear to be improving    Assessment:     Several mycotic nails with improvement    Plan:     Laser applied tolerated well and will be seen back in 4 months

## 2015-06-05 ENCOUNTER — Other Ambulatory Visit: Payer: Self-pay | Admitting: Family Medicine

## 2015-06-07 NOTE — Telephone Encounter (Signed)
Med filled.  

## 2015-08-02 ENCOUNTER — Other Ambulatory Visit: Payer: Self-pay | Admitting: Family Medicine

## 2015-08-02 NOTE — Telephone Encounter (Signed)
Medication filled to pharmacy as requested.   

## 2015-08-23 ENCOUNTER — Ambulatory Visit: Payer: Medicare Other | Admitting: Podiatry

## 2015-08-25 ENCOUNTER — Ambulatory Visit: Payer: Medicare Other | Admitting: Podiatry

## 2015-09-15 ENCOUNTER — Encounter: Payer: Self-pay | Admitting: Podiatry

## 2015-09-15 ENCOUNTER — Ambulatory Visit (INDEPENDENT_AMBULATORY_CARE_PROVIDER_SITE_OTHER): Payer: Medicare Other | Admitting: Podiatry

## 2015-09-15 DIAGNOSIS — B351 Tinea unguium: Secondary | ICD-10-CM

## 2015-09-15 NOTE — Progress Notes (Signed)
Subjective:     Patient ID: Elizabeth [name], female   DOB: [birth year], 74 y.o.   MRN: [id number]  HPI patient presents for nail laser of fungus toenails   Review of Systems     Objective:   Physical Exam Thick toenails    Assessment:     Mycotic nail    Plan:     Laser accomplished today tolerated well by patient

## 2015-09-24 ENCOUNTER — Ambulatory Visit (INDEPENDENT_AMBULATORY_CARE_PROVIDER_SITE_OTHER): Payer: Medicare Other

## 2015-09-24 DIAGNOSIS — Z23 Encounter for immunization: Secondary | ICD-10-CM | POA: Diagnosis not present

## 2015-10-25 ENCOUNTER — Other Ambulatory Visit: Payer: Self-pay

## 2015-10-25 DIAGNOSIS — Z1231 Encounter for screening mammogram for malignant neoplasm of breast: Secondary | ICD-10-CM

## 2015-11-03 ENCOUNTER — Other Ambulatory Visit: Payer: Self-pay | Admitting: Family Medicine

## 2015-11-03 NOTE — Telephone Encounter (Signed)
Medication filled to pharmacy as requested.   

## 2015-11-22 ENCOUNTER — Encounter: Payer: Self-pay | Admitting: Family Medicine

## 2015-12-01 ENCOUNTER — Ambulatory Visit
Admission: RE | Admit: 2015-12-01 | Discharge: 2015-12-01 | Disposition: A | Discharge status: Home / Self Care | Payer: PPO | Source: Ambulatory Visit

## 2015-12-01 DIAGNOSIS — Z1231 Encounter for screening mammogram for malignant neoplasm of breast: Secondary | ICD-10-CM

## 2015-12-10 ENCOUNTER — Other Ambulatory Visit: Payer: Self-pay | Admitting: Family Medicine

## 2015-12-21 ENCOUNTER — Encounter: Payer: PPO | Admitting: Family Medicine

## 2015-12-23 ENCOUNTER — Encounter: Payer: Self-pay | Admitting: Family Medicine

## 2015-12-23 ENCOUNTER — Ambulatory Visit (INDEPENDENT_AMBULATORY_CARE_PROVIDER_SITE_OTHER): Payer: PPO | Admitting: Family Medicine

## 2015-12-23 VITALS — BP 132/86 | HR 74 | Temp 97.7°F | Ht 67.0 in | Wt 147.0 lb

## 2015-12-23 DIAGNOSIS — M545 Low back pain, unspecified: Secondary | ICD-10-CM

## 2015-12-23 DIAGNOSIS — E785 Hyperlipidemia, unspecified: Secondary | ICD-10-CM | POA: Diagnosis not present

## 2015-12-23 DIAGNOSIS — Z0000 Encounter for general adult medical examination without abnormal findings: Secondary | ICD-10-CM

## 2015-12-23 DIAGNOSIS — Z0001 Encounter for general adult medical examination with abnormal findings: Secondary | ICD-10-CM | POA: Diagnosis not present

## 2015-12-23 MED ORDER — TIZANIDINE HCL 4 MG PO CAPS
4.0000 mg | ORAL_CAPSULE | Freq: Every day | ORAL | Status: DC
Start: 2015-12-23 — End: 2016-02-20

## 2015-12-23 NOTE — Assessment & Plan Note (Signed)
Pt is controlling w/ healthy diet and regular exercise.  Not currently on meds.  Check labs.  Start meds prn.

## 2015-12-23 NOTE — Patient Instructions (Signed)
Follow up in 1 year or as needed We'll notify you of your lab results and make any changes if needed Keep up the good work on healthy diet and regular exercise- you look great!!! We'll call you with your Orthopedic appt for the back pain and spasm Start the Zanaflex nightly for pain and spasm- will cause drowsiness You are up to date on colonoscopy until 2025, mammo until 2018, and pap Call with any questions or concerns If you want to join [place] at the new [place] office, any scheduled appointments will automatically transfer and we will see you at [address] [place] [address] [place], [place], [place] [address] ([place]) [place]!!!!

## 2015-12-23 NOTE — Assessment & Plan Note (Signed)
Pt's PE WNL.  UTD on GYN- pap, mammo, DEXA.  UTD on colonoscopy.  UTD on immunizations.  Written screening schedule updated and given to pt.  Check labs.  Anticipatory guidance provided.

## 2015-12-23 NOTE — Assessment & Plan Note (Signed)
New to provider, ongoing for pt.  Pt has scoliosis and either extreme lumbar paraspinal spasm on R or other abnormality.  Debated getting imaging but ortho will want their own imaging so I deferred at this time.  Start muscle relaxer and referral to ortho placed.

## 2015-12-23 NOTE — Progress Notes (Signed)
Pre visit review using our clinic review tool, if applicable. No additional management support is needed unless otherwise documented below in the visit note. 

## 2015-12-23 NOTE — Progress Notes (Signed)
   Subjective:    Patient ID: Elizabeth [name], female    DOB: [birth year], 75 y.o.   MRN: [id number]  HPI Here today for CPE.  Risk Factors: Elevated cholesterol- pt is attempting to control w/ healthy diet and regular exercise Back pain- sxs started ~20 yrs ago, takes Voltaren prn.  Doing Yoga x5 yrs.  Pain is R sided.  Seeing chiropractor twice monthly, has massages regularly.  Has scoliosis on xray (not available for review).  Pt w/ R paraspinal spasm or other abnormality. Physical Activity: exercising regularly Fall Risk: low Depression: denies current sxs Hearing: normal to conversational tones w/ hearing aides ADL's: independent Cognitive: normal linear thought process, memory and attention intact Home Safety: safe at home Height, Weight, BMI, Visual Acuity: see vitals, vision corrected to 20/20 w/ glasses Counseling: UTD on colonoscopy (2025), pap, mammo due Jan 2018) Care team reviewed and updated w/ pt Labs Ordered: See A&P Care Plan: See A&P    Review of Systems Patient reports no vision/ hearing changes, adenopathy,fever, weight change,  persistant/recurrent hoarseness , swallowing issues, chest pain, palpitations, edema, persistant/recurrent cough, hemoptysis, dyspnea (rest/exertional/paroxysmal nocturnal), gastrointestinal bleeding (melena, rectal bleeding), abdominal pain, significant heartburn, bowel changes, GU symptoms (dysuria, hematuria, incontinence), Gyn symptoms (abnormal  bleeding, pain),  syncope, focal weakness, memory loss, numbness & tingling, skin/hair/nail changes, abnormal bruising or bleeding, anxiety, or depression.     Objective:   Physical Exam General Appearance:    Alert, cooperative, no distress, appears stated age  Head:    Normocephalic, without obvious abnormality, atraumatic  Eyes:    PERRL, conjunctiva/corneas clear, EOM's intact, fundi    benign, both eyes  Ears:    Normal TM's and external ear canals, both ears  Nose:   Nares normal,  septum midline, mucosa normal, no drainage    or sinus tenderness  Throat:   Lips, mucosa, and tongue normal; teeth and gums normal  Neck:   Supple, symmetrical, trachea midline, no adenopathy;    Thyroid: no enlargement/tenderness/nodules  Back:     + scoliosis w/ extreme R lumbar paraspinal spasm vs other abnormality  Lungs:     Clear to auscultation bilaterally, respirations unlabored  Chest Wall:    No tenderness or deformity   Heart:    Regular rate and rhythm, S1 and S2 normal, no murmur, rub   or gallop  Breast Exam:    Deferred to GYN  Abdomen:     Soft, non-tender, bowel sounds active all four quadrants,    no masses, no organomegaly  Genitalia:    Deferred to GYN  Rectal:    Extremities:   Extremities normal, atraumatic, no cyanosis or edema  Pulses:   2+ and symmetric all extremities  Skin:   Skin color, texture, turgor normal, no rashes or lesions  Lymph nodes:   Cervical, supraclavicular, and axillary nodes normal  Neurologic:   CNII-XII intact, normal strength, sensation and reflexes    throughout          Assessment & Plan:

## 2015-12-24 LAB — CBC WITH DIFFERENTIAL/PLATELET
BASOS ABS: 0 10*3/uL (ref 0.0–0.1)
Basophils Relative: 0.6 % (ref 0.0–3.0)
Eosinophils Absolute: 0 10*3/uL (ref 0.0–0.7)
Eosinophils Relative: 0.7 % (ref 0.0–5.0)
HEMATOCRIT: 39 % (ref 36.0–46.0)
Hemoglobin: 13 g/dL (ref 12.0–15.0)
LYMPHS PCT: 15.6 % (ref 12.0–46.0)
Lymphs Abs: 1 10*3/uL (ref 0.7–4.0)
MCHC: 33.5 g/dL (ref 30.0–36.0)
MCV: 95 fl (ref 78.0–100.0)
MONOS PCT: 5.5 % (ref 3.0–12.0)
Monocytes Absolute: 0.4 10*3/uL (ref 0.1–1.0)
NEUTROS ABS: 5.1 10*3/uL (ref 1.4–7.7)
Neutrophils Relative %: 77.6 % — ABNORMAL HIGH (ref 43.0–77.0)
Platelets: 328 10*3/uL (ref 150.0–400.0)
RBC: 4.1 Mil/uL (ref 3.87–5.11)
RDW: 12.7 % (ref 11.5–15.5)
WBC: 6.6 10*3/uL (ref 4.0–10.5)

## 2015-12-24 LAB — BASIC METABOLIC PANEL
BUN: 13 mg/dL (ref 6–23)
CALCIUM: 10.3 mg/dL (ref 8.4–10.5)
CO2: 27 mEq/L (ref 19–32)
CREATININE: 0.83 mg/dL (ref 0.40–1.20)
Chloride: 99 mEq/L (ref 96–112)
GFR: 71.62 mL/min (ref >60.00)
GLUCOSE: 95 mg/dL (ref 70–99)
Potassium: 4.5 mEq/L (ref 3.5–5.1)
SODIUM: 133 meq/L — AB (ref 135–145)

## 2015-12-24 LAB — LIPID PANEL
CHOLESTEROL: 210 mg/dL — AB (ref 0–200)
HDL: 76.5 mg/dL (ref >39.00)
LDL CALC: 118 mg/dL — AB (ref 0–99)
NONHDL: 133.65
Total CHOL/HDL Ratio: 3
Triglycerides: 76 mg/dL (ref 0.0–149.0)
VLDL: 15.2 mg/dL (ref 0.0–40.0)

## 2015-12-24 LAB — HEPATIC FUNCTION PANEL
ALBUMIN: 4.4 g/dL (ref 3.5–5.2)
ALT: 14 U/L (ref 0–35)
AST: 19 U/L (ref 0–37)
Alkaline Phosphatase: 60 U/L (ref 39–117)
Bilirubin, Direct: 0.1 mg/dL (ref 0.0–0.3)
TOTAL PROTEIN: 7.7 g/dL (ref 6.0–8.3)
Total Bilirubin: 0.6 mg/dL (ref 0.2–1.2)

## 2015-12-24 LAB — TSH: TSH: 2.08 u[IU]/mL (ref 0.35–4.50)

## 2016-01-06 ENCOUNTER — Other Ambulatory Visit: Payer: Self-pay | Admitting: Family Medicine

## 2016-01-06 NOTE — Telephone Encounter (Signed)
Medication filled to pharmacy as requested.   

## 2016-01-20 ENCOUNTER — Ambulatory Visit (INDEPENDENT_AMBULATORY_CARE_PROVIDER_SITE_OTHER): Payer: PPO

## 2016-01-20 DIAGNOSIS — B351 Tinea unguium: Secondary | ICD-10-CM

## 2016-01-20 NOTE — Progress Notes (Signed)
Subjective:     Patient ID: Elizabeth [name], female   DOB: 02-20-1941, 75 y.o.   MRN: [id number]  HPI patient presents for nail laser of fungus toenails   Review of Systems     Objective:   Physical Exam Thick toenails    Assessment:     Mycotic nail, 90% clearance    Plan:     Laser accomplished today tolerated well by patient, all safety precautions in place. Pt completed lamisil pulse pack, will be seen back on as needed basis

## 2016-02-20 ENCOUNTER — Other Ambulatory Visit: Payer: Self-pay | Admitting: Family Medicine

## 2016-02-21 NOTE — Telephone Encounter (Signed)
Medication filled to pharmacy as requested.   

## 2016-04-10 ENCOUNTER — Telehealth: Payer: Self-pay | Admitting: Family Medicine

## 2016-04-10 MED ORDER — TIZANIDINE HCL 4 MG PO TABS: ORAL_TABLET | ORAL | Status: DC

## 2016-04-10 NOTE — Telephone Encounter (Signed)
We can increase to 3x/day and increase the quantity to 90 but she is not able to drive while taking this medication as it can cause dizziness

## 2016-04-10 NOTE — Telephone Encounter (Signed)
Called pt and lmovm to inform of providers recommendations, asked for pt to return call to verify she wants me to fill meds for additional qty as this means that she will be unable to drive.

## 2016-04-10 NOTE — Telephone Encounter (Signed)
Spoke with patient and advised on the warning to not use the medication when she drives. Pt states that she has already tried this and did not experience any dizziness. Pt states that caffeine and benadryl do not affect her so she feels as though this will not either. Pt was advised that all we can do is give her the warning and hope that she can take precautions and be aware of this side affect.

## 2016-04-10 NOTE — Telephone Encounter (Signed)
Pt states that she is wanting to mange her pain on her own and asking Dr Birdie Riddle to increase her tizanidine from 1 x a day to 3 x day, pt uses [facility] on [address].

## 2016-06-28 ENCOUNTER — Other Ambulatory Visit: Payer: Self-pay | Admitting: General Practice

## 2016-06-28 MED ORDER — DICLOFENAC SODIUM 75 MG PO TBEC: DELAYED_RELEASE_TABLET | ORAL | 2 refills | Status: DC

## 2016-07-04 DIAGNOSIS — B351 Tinea unguium: Secondary | ICD-10-CM

## 2016-08-24 ENCOUNTER — Ambulatory Visit (INDEPENDENT_AMBULATORY_CARE_PROVIDER_SITE_OTHER): Payer: PPO

## 2016-08-24 DIAGNOSIS — Z23 Encounter for immunization: Secondary | ICD-10-CM | POA: Diagnosis not present

## 2016-09-14 ENCOUNTER — Encounter: Payer: Self-pay | Admitting: Podiatry

## 2016-09-14 ENCOUNTER — Ambulatory Visit (INDEPENDENT_AMBULATORY_CARE_PROVIDER_SITE_OTHER): Payer: PPO | Admitting: Podiatry

## 2016-09-14 ENCOUNTER — Ambulatory Visit (INDEPENDENT_AMBULATORY_CARE_PROVIDER_SITE_OTHER): Payer: PPO

## 2016-09-14 VITALS — BP 126/77 | HR 88 | Resp 16

## 2016-09-14 DIAGNOSIS — M79671 Pain in right foot: Secondary | ICD-10-CM

## 2016-09-14 DIAGNOSIS — M2041 Other hammer toe(s) (acquired), right foot: Secondary | ICD-10-CM

## 2016-09-14 DIAGNOSIS — M6740 Ganglion, unspecified site: Secondary | ICD-10-CM | POA: Diagnosis not present

## 2016-09-24 NOTE — Progress Notes (Signed)
Subjective:     Patient ID: Elizabeth [name], female   DOB: 05/26/1941, 75 y.o.   MRN: [id number]  HPI patient presents with a nodule on the dorsum of the right fourth toe that's been painful and has been moving and she's noticed it recently with no history of trauma   Review of Systems  All other systems reviewed and are negative.      Objective:   Physical Exam  Constitutional: She is oriented to person, place, and time.  Cardiovascular: Intact distal pulses.   Musculoskeletal: Normal range of motion.  Neurological: She is oriented to person, place, and time.  Skin: Skin is warm.  Nursing note and vitals reviewed.  neurovascular status found to be intact muscle strength was adequate with range of motion within normal limits. Patient was noted to have a small approximate 3 x 3 mm nodule on the dorsum of the right fourth toe that is painful when palpated with no proximal erythema noted. Patient has good digital perfusion and is well oriented 3     Assessment:     Possibility for nodule which may be ganglionic cyst or other unknown soft tissue lesion    Plan:     H&P condition reviewed and recommended trying to drainage. I explained procedure and the fact ultimately may require surgery or other type of treatment and she wants this to be done currently. I did a proximal nerve block I then went ahead and aspirated the area getting out a small amount of a congealed-type material. It appeared to be localized and did appear to reduce the size of the area and I then injected a very small amount of dexamethasone and went ahead and applied compression dressing. Reappoint if symptoms recur  X-rays indicate there is no signs of calcification or spur formation

## 2016-09-25 ENCOUNTER — Other Ambulatory Visit: Payer: Self-pay | Admitting: Physician Assistant

## 2016-09-25 DIAGNOSIS — M810 Age-related osteoporosis without current pathological fracture: Secondary | ICD-10-CM

## 2016-10-05 ENCOUNTER — Inpatient Hospital Stay: Admission: RE | Admit: 2016-10-05 | Payer: PPO | Source: Ambulatory Visit

## 2016-10-05 ENCOUNTER — Telehealth: Payer: Self-pay | Admitting: Family Medicine

## 2016-10-05 NOTE — Telephone Encounter (Signed)
Yes- ok to schedule Pneumovax

## 2016-10-05 NOTE — Telephone Encounter (Signed)
Pt asking to schedule a pneumonia shot, please advise ok to schedule.

## 2016-10-05 NOTE — Telephone Encounter (Signed)
Called pt and LMOVM to return call.  °

## 2016-10-05 NOTE — Telephone Encounter (Signed)
Please advise, it looks as though pt is due for the pneumovax

## 2016-10-06 ENCOUNTER — Ambulatory Visit (INDEPENDENT_AMBULATORY_CARE_PROVIDER_SITE_OTHER): Payer: PPO | Admitting: Emergency Medicine

## 2016-10-06 DIAGNOSIS — Z23 Encounter for immunization: Secondary | ICD-10-CM

## 2016-10-10 ENCOUNTER — Other Ambulatory Visit: Payer: PPO

## 2016-10-24 ENCOUNTER — Other Ambulatory Visit: Payer: Self-pay | Admitting: Obstetrics and Gynecology

## 2016-10-24 ENCOUNTER — Other Ambulatory Visit: Payer: Self-pay | Admitting: Physician Assistant

## 2016-10-24 DIAGNOSIS — E2839 Other primary ovarian failure: Secondary | ICD-10-CM

## 2016-10-24 DIAGNOSIS — Z1231 Encounter for screening mammogram for malignant neoplasm of breast: Secondary | ICD-10-CM

## 2016-10-26 ENCOUNTER — Ambulatory Visit
Admission: RE | Admit: 2016-10-26 | Discharge: 2016-10-26 | Disposition: A | Discharge status: Home / Self Care | Payer: PPO | Source: Ambulatory Visit | Attending: Physician Assistant | Admitting: Physician Assistant

## 2016-10-26 DIAGNOSIS — E2839 Other primary ovarian failure: Secondary | ICD-10-CM

## 2016-11-03 DIAGNOSIS — G4730 Sleep apnea, unspecified: Secondary | ICD-10-CM | POA: Insufficient documentation

## 2016-11-21 DIAGNOSIS — M5441 Lumbago with sciatica, right side: Secondary | ICD-10-CM | POA: Diagnosis not present

## 2016-11-21 DIAGNOSIS — M5136 Other intervertebral disc degeneration, lumbar region: Secondary | ICD-10-CM | POA: Diagnosis not present

## 2016-11-21 DIAGNOSIS — M47816 Spondylosis without myelopathy or radiculopathy, lumbar region: Secondary | ICD-10-CM | POA: Diagnosis not present

## 2016-11-21 DIAGNOSIS — M9903 Segmental and somatic dysfunction of lumbar region: Secondary | ICD-10-CM | POA: Diagnosis not present

## 2016-12-01 DIAGNOSIS — G4733 Obstructive sleep apnea (adult) (pediatric): Secondary | ICD-10-CM | POA: Diagnosis not present

## 2016-12-01 DIAGNOSIS — M4156 Other secondary scoliosis, lumbar region: Secondary | ICD-10-CM | POA: Diagnosis not present

## 2016-12-01 DIAGNOSIS — Z87891 Personal history of nicotine dependence: Secondary | ICD-10-CM | POA: Diagnosis not present

## 2016-12-01 DIAGNOSIS — Z981 Arthrodesis status: Secondary | ICD-10-CM | POA: Insufficient documentation

## 2016-12-01 DIAGNOSIS — M4187 Other forms of scoliosis, lumbosacral region: Secondary | ICD-10-CM | POA: Diagnosis not present

## 2016-12-01 DIAGNOSIS — M419 Scoliosis, unspecified: Secondary | ICD-10-CM | POA: Diagnosis not present

## 2016-12-01 DIAGNOSIS — I10 Essential (primary) hypertension: Secondary | ICD-10-CM | POA: Diagnosis not present

## 2016-12-01 DIAGNOSIS — D62 Acute posthemorrhagic anemia: Secondary | ICD-10-CM | POA: Diagnosis not present

## 2016-12-01 DIAGNOSIS — G8918 Other acute postprocedural pain: Secondary | ICD-10-CM | POA: Diagnosis not present

## 2016-12-01 DIAGNOSIS — G8929 Other chronic pain: Secondary | ICD-10-CM | POA: Diagnosis not present

## 2016-12-01 DIAGNOSIS — D649 Anemia, unspecified: Secondary | ICD-10-CM | POA: Diagnosis not present

## 2016-12-01 HISTORY — PX: SPINAL FUSION: SHX223

## 2016-12-06 DIAGNOSIS — M419 Scoliosis, unspecified: Secondary | ICD-10-CM | POA: Diagnosis not present

## 2016-12-12 DIAGNOSIS — Z4802 Encounter for removal of sutures: Secondary | ICD-10-CM | POA: Diagnosis not present

## 2016-12-19 ENCOUNTER — Other Ambulatory Visit: Payer: Self-pay | Admitting: Family Medicine

## 2016-12-25 ENCOUNTER — Ambulatory Visit: Payer: PPO | Admitting: Family Medicine

## 2017-01-15 DIAGNOSIS — G8929 Other chronic pain: Secondary | ICD-10-CM | POA: Diagnosis not present

## 2017-01-15 DIAGNOSIS — M549 Dorsalgia, unspecified: Secondary | ICD-10-CM | POA: Diagnosis not present

## 2017-01-15 DIAGNOSIS — Z981 Arthrodesis status: Secondary | ICD-10-CM | POA: Diagnosis not present

## 2017-01-15 DIAGNOSIS — M5441 Lumbago with sciatica, right side: Secondary | ICD-10-CM | POA: Diagnosis not present

## 2017-01-15 DIAGNOSIS — M4325 Fusion of spine, thoracolumbar region: Secondary | ICD-10-CM | POA: Diagnosis not present

## 2017-01-24 DIAGNOSIS — Z0100 Encounter for examination of eyes and vision without abnormal findings: Secondary | ICD-10-CM | POA: Diagnosis not present

## 2017-01-24 DIAGNOSIS — M5441 Lumbago with sciatica, right side: Secondary | ICD-10-CM | POA: Diagnosis not present

## 2017-01-31 DIAGNOSIS — M5441 Lumbago with sciatica, right side: Secondary | ICD-10-CM | POA: Diagnosis not present

## 2017-02-06 DIAGNOSIS — M5441 Lumbago with sciatica, right side: Secondary | ICD-10-CM | POA: Diagnosis not present

## 2017-02-08 DIAGNOSIS — M5441 Lumbago with sciatica, right side: Secondary | ICD-10-CM | POA: Diagnosis not present

## 2017-02-12 DIAGNOSIS — M5441 Lumbago with sciatica, right side: Secondary | ICD-10-CM | POA: Diagnosis not present

## 2017-02-14 DIAGNOSIS — M5441 Lumbago with sciatica, right side: Secondary | ICD-10-CM | POA: Diagnosis not present

## 2017-02-14 DIAGNOSIS — R69 Illness, unspecified: Secondary | ICD-10-CM | POA: Diagnosis not present

## 2017-02-19 DIAGNOSIS — M5441 Lumbago with sciatica, right side: Secondary | ICD-10-CM | POA: Diagnosis not present

## 2017-02-26 DIAGNOSIS — M5441 Lumbago with sciatica, right side: Secondary | ICD-10-CM | POA: Diagnosis not present

## 2017-02-27 ENCOUNTER — Ambulatory Visit
Admission: RE | Admit: 2017-02-27 | Discharge: 2017-02-27 | Disposition: A | Discharge status: Home / Self Care | Payer: Medicare HMO | Source: Ambulatory Visit | Attending: Obstetrics and Gynecology | Admitting: Obstetrics and Gynecology

## 2017-02-27 DIAGNOSIS — Z1231 Encounter for screening mammogram for malignant neoplasm of breast: Secondary | ICD-10-CM

## 2017-03-01 ENCOUNTER — Other Ambulatory Visit: Payer: Self-pay | Admitting: Obstetrics and Gynecology

## 2017-03-01 DIAGNOSIS — M5441 Lumbago with sciatica, right side: Secondary | ICD-10-CM | POA: Diagnosis not present

## 2017-03-01 DIAGNOSIS — R928 Other abnormal and inconclusive findings on diagnostic imaging of breast: Secondary | ICD-10-CM

## 2017-03-02 DIAGNOSIS — H903 Sensorineural hearing loss, bilateral: Secondary | ICD-10-CM | POA: Diagnosis not present

## 2017-03-05 DIAGNOSIS — Z981 Arthrodesis status: Secondary | ICD-10-CM | POA: Diagnosis not present

## 2017-03-05 DIAGNOSIS — Z4889 Encounter for other specified surgical aftercare: Secondary | ICD-10-CM | POA: Diagnosis not present

## 2017-03-05 DIAGNOSIS — M4325 Fusion of spine, thoracolumbar region: Secondary | ICD-10-CM | POA: Diagnosis not present

## 2017-03-06 DIAGNOSIS — M5441 Lumbago with sciatica, right side: Secondary | ICD-10-CM | POA: Diagnosis not present

## 2017-03-07 ENCOUNTER — Ambulatory Visit
Admission: RE | Admit: 2017-03-07 | Discharge: 2017-03-07 | Disposition: A | Discharge status: Home / Self Care | Payer: Medicare HMO | Source: Ambulatory Visit | Attending: Obstetrics and Gynecology | Admitting: Obstetrics and Gynecology

## 2017-03-07 ENCOUNTER — Other Ambulatory Visit: Payer: Self-pay | Admitting: Obstetrics and Gynecology

## 2017-03-07 DIAGNOSIS — R921 Mammographic calcification found on diagnostic imaging of breast: Secondary | ICD-10-CM

## 2017-03-07 DIAGNOSIS — R928 Other abnormal and inconclusive findings on diagnostic imaging of breast: Secondary | ICD-10-CM

## 2017-03-07 DIAGNOSIS — N6011 Diffuse cystic mastopathy of right breast: Secondary | ICD-10-CM | POA: Diagnosis not present

## 2017-03-07 HISTORY — PX: BREAST BIOPSY: SHX20

## 2017-03-09 ENCOUNTER — Other Ambulatory Visit: Payer: Self-pay | Admitting: Obstetrics and Gynecology

## 2017-03-09 DIAGNOSIS — M5441 Lumbago with sciatica, right side: Secondary | ICD-10-CM | POA: Diagnosis not present

## 2017-03-09 DIAGNOSIS — R921 Mammographic calcification found on diagnostic imaging of breast: Secondary | ICD-10-CM

## 2017-03-12 DIAGNOSIS — M5441 Lumbago with sciatica, right side: Secondary | ICD-10-CM | POA: Diagnosis not present

## 2017-03-14 DIAGNOSIS — Z01419 Encounter for gynecological examination (general) (routine) without abnormal findings: Secondary | ICD-10-CM | POA: Diagnosis not present

## 2017-03-14 DIAGNOSIS — Z1389 Encounter for screening for other disorder: Secondary | ICD-10-CM | POA: Diagnosis not present

## 2017-03-14 DIAGNOSIS — Z6821 Body mass index (BMI) 21.0-21.9, adult: Secondary | ICD-10-CM | POA: Diagnosis not present

## 2017-03-14 DIAGNOSIS — Z124 Encounter for screening for malignant neoplasm of cervix: Secondary | ICD-10-CM | POA: Diagnosis not present

## 2017-03-14 DIAGNOSIS — N952 Postmenopausal atrophic vaginitis: Secondary | ICD-10-CM | POA: Diagnosis not present

## 2017-03-15 ENCOUNTER — Ambulatory Visit (INDEPENDENT_AMBULATORY_CARE_PROVIDER_SITE_OTHER): Payer: Medicare HMO | Admitting: Family Medicine

## 2017-03-15 ENCOUNTER — Encounter: Payer: Self-pay | Admitting: Family Medicine

## 2017-03-15 VITALS — BP 124/76 | HR 77 | Temp 98.1°F | Resp 16 | Ht 67.0 in | Wt 139.2 lb

## 2017-03-15 DIAGNOSIS — R8290 Unspecified abnormal findings in urine: Secondary | ICD-10-CM

## 2017-03-15 DIAGNOSIS — M5441 Lumbago with sciatica, right side: Secondary | ICD-10-CM | POA: Diagnosis not present

## 2017-03-15 DIAGNOSIS — E785 Hyperlipidemia, unspecified: Secondary | ICD-10-CM

## 2017-03-15 DIAGNOSIS — Z0000 Encounter for general adult medical examination without abnormal findings: Secondary | ICD-10-CM | POA: Diagnosis not present

## 2017-03-15 LAB — LIPID PANEL
CHOL/HDL RATIO: 2
Cholesterol: 213 mg/dL — ABNORMAL HIGH (ref 0–200)
HDL: 86.7 mg/dL (ref >39.00)
LDL Cholesterol: 113 mg/dL — ABNORMAL HIGH (ref 0–99)
NonHDL: 126.06
TRIGLYCERIDES: 63 mg/dL (ref 0.0–149.0)
VLDL: 12.6 mg/dL (ref 0.0–40.0)

## 2017-03-15 LAB — POCT URINALYSIS DIPSTICK
BILIRUBIN UA: NEGATIVE
Glucose, UA: NEGATIVE
Ketones, UA: NEGATIVE
LEUKOCYTES UA: NEGATIVE
NITRITE UA: NEGATIVE
Protein, UA: NEGATIVE
RBC UA: NEGATIVE
Spec Grav, UA: <=1.005 — AB (ref 1.010–1.025)
Urobilinogen, UA: 0.2 E.U./dL
pH, UA: 6.5 (ref 5.0–8.0)

## 2017-03-15 LAB — BASIC METABOLIC PANEL
BUN: 11 mg/dL (ref 6–23)
CHLORIDE: 101 meq/L (ref 96–112)
CO2: 29 meq/L (ref 19–32)
Calcium: 10.2 mg/dL (ref 8.4–10.5)
Creatinine, Ser: 0.65 mg/dL (ref 0.40–1.20)
GFR: 94.64 mL/min (ref >60.00)
Glucose, Bld: 101 mg/dL — ABNORMAL HIGH (ref 70–99)
POTASSIUM: 4.7 meq/L (ref 3.5–5.1)
Sodium: 135 mEq/L (ref 135–145)

## 2017-03-15 LAB — CBC WITH DIFFERENTIAL/PLATELET
BASOS ABS: 0.1 10*3/uL (ref 0.0–0.1)
Basophils Relative: 1.2 % (ref 0.0–3.0)
EOS ABS: 0 10*3/uL (ref 0.0–0.7)
Eosinophils Relative: 0.7 % (ref 0.0–5.0)
HEMATOCRIT: 37.8 % (ref 36.0–46.0)
Hemoglobin: 12.7 g/dL (ref 12.0–15.0)
LYMPHS PCT: 20.5 % (ref 12.0–46.0)
Lymphs Abs: 0.9 10*3/uL (ref 0.7–4.0)
MCHC: 33.6 g/dL (ref 30.0–36.0)
MCV: 93.2 fl (ref 78.0–100.0)
MONOS PCT: 8.6 % (ref 3.0–12.0)
Monocytes Absolute: 0.4 10*3/uL (ref 0.1–1.0)
NEUTROS PCT: 69 % (ref 43.0–77.0)
Neutro Abs: 3.1 10*3/uL (ref 1.4–7.7)
Platelets: 332 10*3/uL (ref 150.0–400.0)
RBC: 4.05 Mil/uL (ref 3.87–5.11)
RDW: 14.2 % (ref 11.5–15.5)
WBC: 4.5 10*3/uL (ref 4.0–10.5)

## 2017-03-15 LAB — HEPATIC FUNCTION PANEL
ALBUMIN: 4.5 g/dL (ref 3.5–5.2)
ALT: 9 U/L (ref 0–35)
AST: 14 U/L (ref 0–37)
Alkaline Phosphatase: 67 U/L (ref 39–117)
BILIRUBIN TOTAL: 0.5 mg/dL (ref 0.2–1.2)
Bilirubin, Direct: 0.1 mg/dL (ref 0.0–0.3)
Total Protein: 7.6 g/dL (ref 6.0–8.3)

## 2017-03-15 LAB — TSH: TSH: 2.3 u[IU]/mL (ref 0.35–4.50)

## 2017-03-15 NOTE — Progress Notes (Signed)
Pre visit review using our clinic review tool, if applicable. No additional management support is needed unless otherwise documented below in the visit note. 

## 2017-03-15 NOTE — Assessment & Plan Note (Signed)
Pt's PE WNL.  UTD on colonoscopy, mammo, DEXA, pap, and immunizations.  Written screening schedule updated and given to pt.  Check labs.  Anticipatory guidance provided.

## 2017-03-15 NOTE — Patient Instructions (Addendum)
Follow up in 1 year or as needed We'll notify you of your lab results and make any changes if needed Continue to work on healthy diet and regular exercise- you look great!!! You are up to date on mammogram, colonoscopy, pap, bone density- you're amazing Call with any questions or concerns GOOD LUCK!!!  Bring a copy of your advance directives to your next office visit.  Continue doing brain stimulating activities (puzzles, reading, adult coloring books, staying active) to keep memory sharp.   Health Maintenance, Female Adopting a healthy lifestyle and getting preventive care can go a long way to promote health and wellness. Talk with your health care provider about what schedule of regular examinations is right for you. This is a good chance for you to check in with your provider about disease prevention and staying healthy. In between checkups, there are plenty of things you can do on your own. Experts have done a lot of research about which lifestyle changes and preventive measures are most likely to keep you healthy. Ask your health care provider for more information. Weight and diet Eat a healthy diet  Be sure to include plenty of vegetables, fruits, low-fat dairy products, and lean protein.  Do not eat a lot of foods high in solid fats, added sugars, or salt.  Get regular exercise. This is one of the most important things you can do for your health.  Most adults should exercise for at least 150 minutes each week. The exercise should increase your heart rate and make you sweat (moderate-intensity exercise).  Most adults should also do strengthening exercises at least twice a week. This is in addition to the moderate-intensity exercise. Maintain a healthy weight  Body mass index (BMI) is a measurement that can be used to identify possible weight problems. It estimates body fat based on height and weight. Your health care provider can help determine your BMI and help you achieve or  maintain a healthy weight.  For females 36 years of age and older:  A BMI below 18.5 is considered underweight.  A BMI of 18.5 to 24.9 is normal.  A BMI of 25 to 29.9 is considered overweight.  A BMI of 30 and above is considered obese. Watch levels of cholesterol and blood lipids  You should start having your blood tested for lipids and cholesterol at 74 years of age, then have this test every 5 years.  You may need to have your cholesterol levels checked more often if:  Your lipid or cholesterol levels are high.  You are older than 74 years of age.  You are at high risk for heart disease. Cancer screening Lung Cancer  Lung cancer screening is recommended for adults 29-49 years old who are at high risk for lung cancer because of a history of smoking.  A yearly low-dose CT scan of the lungs is recommended for people who:  Currently smoke.  Have quit within the past 15 years.  Have at least a 30-pack-year history of smoking. A pack year is smoking an average of one pack of cigarettes a day for 1 year.  Yearly screening should continue until it has been 15 years since you quit.  Yearly screening should stop if you develop a health problem that would prevent you from having lung cancer treatment. Breast Cancer  Practice breast self-awareness. This means understanding how your breasts normally appear and feel.  It also means doing regular breast self-exams. Let your health care provider know about any changes, no  matter how small.  If you are in your 20s or 30s, you should have a clinical breast exam (CBE) by a health care provider every 1-3 years as part of a regular health exam.  If you are 86 or older, have a CBE every year. Also consider having a breast X-ray (mammogram) every year.  If you have a family history of breast cancer, talk to your health care provider about genetic screening.  If you are at high risk for breast cancer, talk to your health care provider  about having an MRI and a mammogram every year.  Breast cancer gene (BRCA) assessment is recommended for women who have family members with BRCA-related cancers. BRCA-related cancers include:  Breast.  Ovarian.  Tubal.  Peritoneal cancers.  Results of the assessment will determine the need for genetic counseling and BRCA1 and BRCA2 testing. Cervical Cancer  Your health care provider may recommend that you be screened regularly for cancer of the pelvic organs (ovaries, uterus, and vagina). This screening involves a pelvic examination, including checking for microscopic changes to the surface of your cervix (Pap test). You may be encouraged to have this screening done every 3 years, beginning at age 86.  For women ages 1-65, health care providers may recommend pelvic exams and Pap testing every 3 years, or they may recommend the Pap and pelvic exam, combined with testing for human papilloma virus (HPV), every 5 years. Some types of HPV increase your risk of cervical cancer. Testing for HPV may also be done on women of any age with unclear Pap test results.  Other health care providers may not recommend any screening for nonpregnant women who are considered low risk for pelvic cancer and who do not have symptoms. Ask your health care provider if a screening pelvic exam is right for you.  If you have had past treatment for cervical cancer or a condition that could lead to cancer, you need Pap tests and screening for cancer for at least 20 years after your treatment. If Pap tests have been discontinued, your risk factors (such as having a new sexual partner) need to be reassessed to determine if screening should resume. Some women have medical problems that increase the chance of getting cervical cancer. In these cases, your health care provider may recommend more frequent screening and Pap tests. Colorectal Cancer  This type of cancer can be detected and often prevented.  Routine colorectal  cancer screening usually begins at 74 years of age and continues through 74 years of age.  Your health care provider may recommend screening at an earlier age if you have risk factors for colon cancer.  Your health care provider may also recommend using home test kits to check for hidden blood in the stool.  A small camera at the end of a tube can be used to examine your colon directly (sigmoidoscopy or colonoscopy). This is done to check for the earliest forms of colorectal cancer.  Routine screening usually begins at age 91.  Direct examination of the colon should be repeated every 5-10 years through 74 years of age. However, you may need to be screened more often if early forms of precancerous polyps or small growths are found. Skin Cancer  Check your skin from head to toe regularly.  Tell your health care provider about any new moles or changes in moles, especially if there is a change in a mole's shape or color.  Also tell your health care provider if you have a mole  that is larger than the size of a pencil eraser.  Always use sunscreen. Apply sunscreen liberally and repeatedly throughout the day.  Protect yourself by wearing long sleeves, pants, a wide-brimmed hat, and sunglasses whenever you are outside. Heart disease, diabetes, and high blood pressure  High blood pressure causes heart disease and increases the risk of stroke. High blood pressure is more likely to develop in:  People who have blood pressure in the high end of the normal range (130-139/85-89 mm Hg).  People who are overweight or obese.  People who are African American.  If you are 66-36 years of age, have your blood pressure checked every 3-5 years. If you are 29 years of age or older, have your blood pressure checked every year. You should have your blood pressure measured twice-once when you are at a hospital or clinic, and once when you are not at a hospital or clinic. Record the average of the two  measurements. To check your blood pressure when you are not at a hospital or clinic, you can use:  An automated blood pressure machine at a pharmacy.  A home blood pressure monitor.  If you are between 65 years and 45 years old, ask your health care provider if you should take aspirin to prevent strokes.  Have regular diabetes screenings. This involves taking a blood sample to check your fasting blood sugar level.  If you are at a normal weight and have a low risk for diabetes, have this test once every three years after 74 years of age.  If you are overweight and have a high risk for diabetes, consider being tested at a younger age or more often. Preventing infection Hepatitis B  If you have a higher risk for hepatitis B, you should be screened for this virus. You are considered at high risk for hepatitis B if:  You were born in a country where hepatitis B is common. Ask your health care provider which countries are considered high risk.  Your parents were born in a high-risk country, and you have not been immunized against hepatitis B (hepatitis B vaccine).  You have HIV or AIDS.  You use needles to inject street drugs.  You live with someone who has hepatitis B.  You have had sex with someone who has hepatitis B.  You get hemodialysis treatment.  You take certain medicines for conditions, including cancer, organ transplantation, and autoimmune conditions. Hepatitis C  Blood testing is recommended for:  Everyone born from [birth year] through 1963.  Anyone with known risk factors for hepatitis C. Sexually transmitted infections (STIs)  You should be screened for sexually transmitted infections (STIs) including gonorrhea and chlamydia if:  You are sexually active and are younger than 74 years of age.  You are older than 74 years of age and your health care provider tells you that you are at risk for this type of infection.  Your sexual activity has changed since you were last  screened and you are at an increased risk for chlamydia or gonorrhea. Ask your health care provider if you are at risk.  If you do not have HIV, but are at risk, it may be recommended that you take a prescription medicine daily to prevent HIV infection. This is called pre-exposure prophylaxis (PrEP). You are considered at risk if:  You are sexually active and do not regularly use condoms or know the HIV status of your partner(s).  You take drugs by injection.  You are sexually active with  a partner who has HIV. Talk with your health care provider about whether you are at high risk of being infected with HIV. If you choose to begin PrEP, you should first be tested for HIV. You should then be tested every 3 months for as long as you are taking PrEP. Pregnancy  If you are premenopausal and you may become pregnant, ask your health care provider about preconception counseling.  If you may become pregnant, take 400 to 800 micrograms (mcg) of folic acid every day.  If you want to prevent pregnancy, talk to your health care provider about birth control (contraception). Osteoporosis and menopause  Osteoporosis is a disease in which the bones lose minerals and strength with aging. This can result in serious bone fractures. Your risk for osteoporosis can be identified using a bone density scan.  If you are 48 years of age or older, or if you are at risk for osteoporosis and fractures, ask your health care provider if you should be screened.  Ask your health care provider whether you should take a calcium or vitamin D supplement to lower your risk for osteoporosis.  Menopause may have certain physical symptoms and risks.  Hormone replacement therapy may reduce some of these symptoms and risks. Talk to your health care provider about whether hormone replacement therapy is right for you. Follow these instructions at home:  Schedule regular health, dental, and eye exams.  Stay current with your  immunizations.  Do not use any tobacco products including cigarettes, chewing tobacco, or electronic cigarettes.  If you are pregnant, do not drink alcohol.  If you are breastfeeding, limit how much and how often you drink alcohol.  Limit alcohol intake to no more than 1 drink per day for nonpregnant women. One drink equals 12 ounces of beer, 5 ounces of wine, or 1 ounces of hard liquor.  Do not use street drugs.  Do not share needles.  Ask your health care provider for help if you need support or information about quitting drugs.  Tell your health care provider if you often feel depressed.  Tell your health care provider if you have ever been abused or do not feel safe at home. This information is not intended to replace advice given to you by your health care provider. Make sure you discuss any questions you have with your health care provider. Document Released: 05/22/2011 Document Revised: 04/13/2016 Document Reviewed: 08/10/2015 Elsevier Interactive Patient Education  2017 [employer].

## 2017-03-15 NOTE — Progress Notes (Addendum)
Subjective:   Elizabeth [name] is a 76 y.o. female who presents for Medicare Annual (Subsequent) preventive examination.  Review of Systems:  No ROS.  Medicare Wellness Visit.  Cardiac Risk Factors include: advanced age (>49men, >29 women);family history of premature cardiovascular disease   Sleep patterns:Sleeps 6-7 hours. Up to void x 1 (rarely). Home Safety/Smoke Alarms:  Smoke detectors and security in place.  Living environment; residence and Firearm Safety: Lives with husband in 2 story home, rails at steps. Feels safe in home.  Seat Belt Safety/Bike Helmet: Wears seat belt.   Counseling:   Eye Exam-Last exam 04/2016, yearly by Dr. Marica Otter  Dental-Last exam 01/2017, every 4 months by Dr. Luretha Rued  Female:   Pap-02/25/2015       Mammo-03/07/2017, suspicious. Breast biopsy, repeat bx 03/2017.       Dexa scan-10/26/2016, Osteoporosis       CCS-Colonoscopy 01/05/2014, Diverticulosis. Recall 10 years.       Objective:     Vitals: BP 124/76   Pulse 77   Temp 98.1 F (36.7 C) (Oral)   Resp 16   Ht 5\' 7"  (1.702 m)   Wt 139 lb 4 oz (63.2 kg)   SpO2 98%   BMI 21.81 kg/m   Body mass index is 21.81 kg/m.   Tobacco History  Smoking Status  . Former Smoker  . Types: Cigarettes  Smokeless Tobacco  . Never Used     Counseling given: Yes   Past Medical History:  Diagnosis Date  . Arthritis   . Chicken pox   . Shingles 07-2002  . Urinary tract infection    Past Surgical History:  Procedure Laterality Date  . HYSTEROSCOPY     Dr Ubaldo Glassing  . WISDOM TOOTH EXTRACTION     age 38's   Family History  Problem Relation Age of Onset  . Diabetes Mother   . Arthritis Father   . Cancer Maternal Aunt     liver  . Heart disease Paternal Grandfather   . Colon cancer Neg Hx   . Pancreatic cancer Neg Hx   . Stomach cancer Neg Hx    History  Sexual Activity  . Sexual activity: Not on file    Outpatient Encounter Prescriptions as of 03/15/2017  Medication Sig  .  Biotin 2.5 MG CAPS Take 1 capsule by mouth daily.  . Cholecalciferol (VITAMIN D) 2000 UNITS CAPS Take 1 capsule by mouth daily.  . diazepam (VALIUM) 5 MG tablet   . estradiol (ESTRACE) 0.1 MG/GM vaginal cream Place 1 g vaginally 2 (two) times a week.  . Glucosamine Sulfate 1500 MG PACK Take 2 capsules by mouth 2 (two) times daily. Plus herbs  . Methylcellulose, Laxative, (CITRUCEL PO) Take 2 tablets by mouth daily.  . Nutritional Supplements (JUICE PLUS FIBRE PO) Take by mouth.  . Probiotic Product (ADVANCED PROBIOTIC 10) CAPS Take 1 capsule by mouth daily.  . traMADol (ULTRAM) 50 MG tablet Take by mouth.  . [DISCONTINUED] GARCINIA CAMBOGIA-CHROMIUM PO Take 500 mg by mouth 3 (three) times daily as needed.  . [DISCONTINUED] NON FORMULARY Vinegar 2tbsp daily  . [DISCONTINUED] tiZANidine (ZANAFLEX) 4 MG tablet TAKE 1 TABLET BY MOUTH 3 TIMES A DAY   No facility-administered encounter medications on file as of 03/15/2017.     Activities of Daily Living In your present state of health, do you have any difficulty performing the following activities: 03/15/2017 03/15/2017  Hearing? N N  Vision? N N  Difficulty concentrating or making decisions?  N N  Walking or climbing stairs? N N  Dressing or bathing? N N  Doing errands, shopping? N N  Preparing Food and eating ? N -  Using the Toilet? N -  In the past six months, have you accidently leaked urine? N -  Do you have problems with loss of bowel control? N -  Managing your Medications? N -  Managing your Finances? N -  Housekeeping or managing your Housekeeping? N -  Some recent data might be hidden    Patient Care Team: Midge Minium, MD as PCP - General (Family Medicine) Paula Compton, MD as Consulting Physician (Obstetrics and Gynecology) Gloris Manchester, MD (Neurosurgery) Wallene Huh, DPM as Consulting Physician (Podiatry) Marica Otter, OD (Optometry)    Assessment:    Physical assessment deferred to PCP.  Exercise  Activities and Dietary recommendations Current Exercise Habits: Home exercise routine (recent back surgery, doing PT), Type of exercise: walking, Time (Minutes): 40, Frequency (Times/Week): 5, Weekly Exercise (Minutes/Week): 200, Intensity: Mild, Exercise limited by: orthopedic condition(s)   Diet (meal preparation, eat out, water intake, caffeinated beverages, dairy products, fruits and vegetables): Drinks decaf tea, decaf coffee and water.   Breakfast: greek yogurt, berries, eggs Lunch: salads, vegetables Dinner: lean protein, vegetables.   Encouraged to continue heart healthy diet and activity.   Goals      Patient Stated   . Maintain (pt-stated)          Maintain current health status by staying active, eating healthy and begin yoga again.       Fall Risk Fall Risk  03/15/2017 03/15/2017 12/23/2015 12/17/2014 10/08/2013  Falls in the past year? No No No No No   Depression Screen PHQ 2/9 Scores 03/15/2017 03/15/2017 03/15/2017 12/23/2015  PHQ - 2 Score 0 0 0 0  PHQ- 9 Score - 0 0 -  Exception Documentation - - - Patient refusal     Cognitive Function       Ad8 score reviewed for issues:  Issues making decisions: no  Less interest in hobbies / activities: no  Repeats questions, stories (family complaining): no  Trouble using ordinary gadgets (microwave, computer, phone): no  Forgets the month or year: no  Mismanaging finances: no  Remembering appts: no  Daily problems with thinking and/or memory:no Ad8 score is=0     Immunization History  Administered Date(s) Administered  . Influenza, High Dose Seasonal PF 09/12/2013, 09/24/2015, 08/24/2016  . Influenza,inj,Quad PF,36+ Mos 08/24/2014  . Influenza-Unspecified 09/12/2013  . Pneumococcal Conjugate-13 07/22/2008, 12/17/2014  . Pneumococcal Polysaccharide-23 10/06/2016  . Tetanus 10/08/2013  . Zoster 07/22/2008   Screening Tests Health Maintenance  Topic Date Due  . INFLUENZA VACCINE  06/20/2017  . MAMMOGRAM   03/08/2019  . TETANUS/TDAP  10/09/2023  . COLONOSCOPY  01/06/2024  . DEXA SCAN  Completed  . PNA vac Low Risk Adult  Completed      Plan:     Bring a copy of your advance directives to your next office visit.  Continue doing brain stimulating activities (puzzles, reading, adult coloring books, staying active) to keep memory sharp.    During the course of the visit the patient was educated and counseled about the following appropriate screening and preventive services:   Vaccines to include Pneumoccal, Influenza, Hepatitis B, Td, Zostavax, HCV  Cardiovascular Disease  Colorectal cancer screening  Bone density screening  Diabetes screening  Glaucoma screening  Mammography/PAP  Nutrition counseling   Patient Instructions (the written plan) was given to the  patient.   Gerilyn Nestle, RN  03/15/2017  Agree w/ documentation above.  Annye Asa, MD

## 2017-03-15 NOTE — Assessment & Plan Note (Signed)
Pt has hx of elevated lipids but is controlling w/ healthy diet and regular exercise.  Check labs.  Start meds prn.

## 2017-03-15 NOTE — Progress Notes (Signed)
   Subjective:    Patient ID: Elizabeth [name], female    DOB: 08-30-1941, 76 y.o.   MRN: [id number]  HPI CPE- UTD on pap, mammo, colonoscopy, DEXA, and immunizations.  Pt has lost 8 lbs since last visit.     Review of Systems Patient reports no vision/ hearing changes, adenopathy,fever, weight change,  persistant/recurrent hoarseness , swallowing issues, chest pain, palpitations, edema, persistant/recurrent cough, hemoptysis, dyspnea (rest/exertional/paroxysmal nocturnal), gastrointestinal bleeding (melena, rectal bleeding), abdominal pain, significant heartburn, bowel changes, GU symptoms (dysuria, hematuria, incontinence), Gyn symptoms (abnormal  bleeding, pain),  syncope, focal weakness, memory loss, numbness & tingling, skin/hair/nail changes, abnormal bruising or bleeding, anxiety, or depression.     Objective:   Physical Exam General Appearance:    Alert, cooperative, no distress, appears stated age  Head:    Normocephalic, without obvious abnormality, atraumatic  Eyes:    PERRL, conjunctiva/corneas clear, EOM's intact, fundi    benign, both eyes  Ears:    Normal TM's and external ear canals, both ears  Nose:   Nares normal, septum midline, mucosa normal, no drainage    or sinus tenderness  Throat:   Lips, mucosa, and tongue normal; teeth and gums normal  Neck:   Supple, symmetrical, trachea midline, no adenopathy;    Thyroid: no enlargement/tenderness/nodules  Back:     Symmetric, no curvature, ROM normal, no CVA tenderness  Lungs:     Clear to auscultation bilaterally, respirations unlabored  Chest Wall:    No tenderness or deformity   Heart:    Regular rate and rhythm, S1 and S2 normal, no murmur, rub   or gallop  Breast Exam:    Deferred to GYN  Abdomen:     Soft, non-tender, bowel sounds active all four quadrants,    no masses, no organomegaly  Genitalia:    Deferred to GYN  Rectal:    Extremities:   Extremities normal, atraumatic, no cyanosis or edema  Pulses:   2+ and  symmetric all extremities  Skin:   Skin color, texture, turgor normal, no rashes or lesions  Lymph nodes:   Cervical, supraclavicular, and axillary nodes normal  Neurologic:   CNII-XII intact, normal strength, sensation and reflexes    throughout          Assessment & Plan:

## 2017-03-26 ENCOUNTER — Ambulatory Visit
Admission: RE | Admit: 2017-03-26 | Discharge: 2017-03-26 | Disposition: A | Discharge status: Home / Self Care | Payer: Medicare HMO | Source: Ambulatory Visit | Attending: Obstetrics and Gynecology | Admitting: Obstetrics and Gynecology

## 2017-03-26 DIAGNOSIS — R921 Mammographic calcification found on diagnostic imaging of breast: Secondary | ICD-10-CM

## 2017-03-26 DIAGNOSIS — N6011 Diffuse cystic mastopathy of right breast: Secondary | ICD-10-CM | POA: Diagnosis not present

## 2017-03-26 DIAGNOSIS — D0512 Intraductal carcinoma in situ of left breast: Secondary | ICD-10-CM | POA: Diagnosis not present

## 2017-03-26 HISTORY — PX: BREAST BIOPSY: SHX20

## 2017-03-27 DIAGNOSIS — M5441 Lumbago with sciatica, right side: Secondary | ICD-10-CM | POA: Diagnosis not present

## 2017-03-29 ENCOUNTER — Other Ambulatory Visit: Payer: Self-pay | Admitting: Obstetrics and Gynecology

## 2017-03-29 ENCOUNTER — Telehealth: Payer: Self-pay | Admitting: *Deleted

## 2017-03-29 DIAGNOSIS — R921 Mammographic calcification found on diagnostic imaging of breast: Secondary | ICD-10-CM

## 2017-03-29 NOTE — Telephone Encounter (Signed)
Mailed [facility] packet to pt. 

## 2017-03-29 NOTE — Telephone Encounter (Signed)
Confirmed [facility] for 04/04/17 at 1215 .  Instructions and contact information given.

## 2017-03-29 NOTE — Addendum Note (Signed)
Encounter addended by: Evalee Mutton NWGNFA on: 03/29/2017  2:03 PM<BR>    Actions taken: Imaging Exam ended

## 2017-04-03 ENCOUNTER — Encounter: Payer: Self-pay | Admitting: *Deleted

## 2017-04-03 ENCOUNTER — Other Ambulatory Visit: Payer: Self-pay | Admitting: *Deleted

## 2017-04-03 DIAGNOSIS — Z170 Estrogen receptor positive status [ER+]: Secondary | ICD-10-CM | POA: Insufficient documentation

## 2017-04-03 DIAGNOSIS — C50512 Malignant neoplasm of lower-outer quadrant of left female breast: Secondary | ICD-10-CM

## 2017-04-03 DIAGNOSIS — M5441 Lumbago with sciatica, right side: Secondary | ICD-10-CM | POA: Diagnosis not present

## 2017-04-04 ENCOUNTER — Other Ambulatory Visit (HOSPITAL_BASED_OUTPATIENT_CLINIC_OR_DEPARTMENT_OTHER): Payer: Medicare HMO

## 2017-04-04 ENCOUNTER — Ambulatory Visit (HOSPITAL_BASED_OUTPATIENT_CLINIC_OR_DEPARTMENT_OTHER): Payer: Medicare HMO | Admitting: Hematology and Oncology

## 2017-04-04 ENCOUNTER — Ambulatory Visit: Payer: Self-pay | Admitting: General Surgery

## 2017-04-04 ENCOUNTER — Encounter: Payer: Self-pay | Admitting: Hematology and Oncology

## 2017-04-04 ENCOUNTER — Encounter (HOSPITAL_COMMUNITY): Payer: Self-pay

## 2017-04-04 ENCOUNTER — Ambulatory Visit
Admission: RE | Admit: 2017-04-04 | Discharge: 2017-04-04 | Disposition: A | Discharge status: Home / Self Care | Payer: Medicare HMO | Source: Ambulatory Visit | Attending: Radiation Oncology | Admitting: Radiation Oncology

## 2017-04-04 ENCOUNTER — Ambulatory Visit: Payer: Medicare HMO | Admitting: Physical Therapy

## 2017-04-04 DIAGNOSIS — Z170 Estrogen receptor positive status [ER+]: Principal | ICD-10-CM

## 2017-04-04 DIAGNOSIS — D0512 Intraductal carcinoma in situ of left breast: Secondary | ICD-10-CM

## 2017-04-04 DIAGNOSIS — Z79899 Other long term (current) drug therapy: Secondary | ICD-10-CM | POA: Insufficient documentation

## 2017-04-04 DIAGNOSIS — C50512 Malignant neoplasm of lower-outer quadrant of left female breast: Secondary | ICD-10-CM

## 2017-04-04 DIAGNOSIS — Z510 Encounter for antineoplastic radiation therapy: Secondary | ICD-10-CM | POA: Insufficient documentation

## 2017-04-04 DIAGNOSIS — Z87891 Personal history of nicotine dependence: Secondary | ICD-10-CM | POA: Insufficient documentation

## 2017-04-04 LAB — COMPREHENSIVE METABOLIC PANEL
ALT: 8 U/L (ref 0–55)
AST: 14 U/L (ref 5–34)
Albumin: 4.1 g/dL (ref 3.5–5.0)
Alkaline Phosphatase: 74 U/L (ref 40–150)
Anion Gap: 7 mEq/L (ref 3–11)
BUN: 11.1 mg/dL (ref 7.0–26.0)
CHLORIDE: 102 meq/L (ref 98–109)
CO2: 27 meq/L (ref 22–29)
CREATININE: 0.8 mg/dL (ref 0.6–1.1)
Calcium: 10.1 mg/dL (ref 8.4–10.4)
EGFR: 74 mL/min/{1.73_m2} — ABNORMAL LOW (ref >90)
Glucose: 106 mg/dl (ref 70–140)
Potassium: 4.2 mEq/L (ref 3.5–5.1)
SODIUM: 136 meq/L (ref 136–145)
Total Bilirubin: 0.47 mg/dL (ref 0.20–1.20)
Total Protein: 7.9 g/dL (ref 6.4–8.3)

## 2017-04-04 LAB — CBC WITH DIFFERENTIAL/PLATELET
BASO%: 0.4 % (ref 0.0–2.0)
BASOS ABS: 0 10*3/uL (ref 0.0–0.1)
EOS%: 0.4 % (ref 0.0–7.0)
Eosinophils Absolute: 0 10*3/uL (ref 0.0–0.5)
HCT: 37.8 % (ref 34.8–46.6)
HGB: 12.6 g/dL (ref 11.6–15.9)
LYMPH#: 1 10*3/uL (ref 0.9–3.3)
LYMPH%: 17.5 % (ref 14.0–49.7)
MCH: 31.3 pg (ref 25.1–34.0)
MCHC: 33.3 g/dL (ref 31.5–36.0)
MCV: 93.8 fL (ref 79.5–101.0)
MONO#: 0.5 10*3/uL (ref 0.1–0.9)
MONO%: 8.5 % (ref 0.0–14.0)
NEUT#: 4.2 10*3/uL (ref 1.5–6.5)
NEUT%: 73.2 % (ref 38.4–76.8)
Platelets: 255 10*3/uL (ref 145–400)
RBC: 4.03 10*6/uL (ref 3.70–5.45)
RDW: 13.2 % (ref 11.2–14.5)
WBC: 5.7 10*3/uL (ref 3.9–10.3)

## 2017-04-04 NOTE — Progress Notes (Signed)
Nutrition Assessment  Reason for Assessment:  Pt seen in Breast Clinic  ASSESSMENT:   76 year old female with new diagnosis of breast cancer.  Past medical history of spinal fusion.  Patient reports good appetite.    Medications:  reviewed  Labs: reviewed  Anthropometrics:   Height: 67 inches Weight: 141 lb 1.6 oz BMI: 22.1   NUTRITION DIAGNOSIS: Food and nutrition related knowledge deficit related to new diagnosis of breast cancer as evidenced by no prior need for nutrition related information.  INTERVENTION:   Discussed and provided packet of information regarding nutritional tips for breast cancer patients.  Questions answered.  Teachback method used.  Contact information provided and patient knows to contact me with questions/concerns.    MONITORING, EVALUATION, and GOAL: Pt will consume a healthy plant based diet to maintain lean body mass throughout treatment.   Tanique Matney B. Zenia Resides, [place], [place] Registered Dietitian [phone] (pager)

## 2017-04-04 NOTE — Progress Notes (Signed)
[place] Psychosocial Distress Screening Spiritual Care  Counselor met with pt and husband in [facility] to introduce [facility] team/resources, reviewing distress screen per protocol. The patient scored a 3 on the Psychosocial Distress Thermometer which indicates Mild distress. Pt stated that her anxiety/nervousness about tx decreased as she had her questions answered by the multidisciplinary team. Pt stated that while she has been concerned about how the diagnosis and tx will affect her daily life and plans, she has not experienced loss of sleep or decreased appetite. Also assessed for distress and other psychosocial needs.    Follow up needed: No. Counselor provided support center packet, including information on support groups, counseling, and art therapy activities and pt knows to call with any concerns.   Westly Pam, MS [facility] [phone]   [facility] DISTRESS SCREENING 04/04/2017  Screening Type Initial Screening  Distress experienced in past week (1-10) 3  Family Problem type Partner;Children  Emotional problem type Nervousness/Anxiety;Adjusting to illness  Information Concerns Type Lack of info about treatment;Lack of info about complementary therapy choices;Lack of info about maintaining fitness

## 2017-04-04 NOTE — Progress Notes (Signed)
Radiation Oncology         [phone] ________________________________  Initial outpatient Consultation  Name: Elizabeth [name] MRN: [id number]  Date: 04/04/2017  DOB: 03/06/1941  CH:YIFOYD, Aundra Millet, MD  Jovita Kussmaul, MD   REFERRING PHYSICIAN: Autumn Messing III, MD  DIAGNOSIS:    ICD-9-CM ICD-10-CM   1. 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 female who had a screening MAMMOGRAM on 02/28/2017. This revealed bilateral breast calcifications. Bilateral diagnostic breast mammogram on 03/07/2017 revealed in the 6:00 position of the left breast a 3 x 2 x 2 mm group of calcifications, a 3 x 3 x 3 mm group of calcifications in the UOQ right breast, and a 4 x 2 x 1 mm group of calcifications in the UIQ of the right breast.  The patient underwent biopsies on 03/26/2017 Biopsy of the UIQ right breast revealed calcifications associated with fibrocystic changes and atrophic lobules, no atypia, and no in situ or invasive malignancy identified. Biopsy of the LOQ left breast revealed calcifications associated with high grade DCIS and atrophic lobules. (ER 95% positive and PR 95% positive) The radiologist of the diagnostic mammogram recommended the second group of indeterminate calcifications in the right breast could be biopsied on a different day.  The patient presents today, with her husband, in  multidisciplinary breast clinic to discuss treatment options for the management of her disease. The patient has scoliosis for which she had spinal fusion surgery earlier this year. The patient has to sit with a pad between her back and the chair. She has hearing loss, uses hearing aids, has occasional nose bleeds, arthritis, and occasional hot flashes.  PREVIOUS RADIATION THERAPY: No  PAST MEDICAL HISTORY:  has a past medical history of Arthritis; Chicken pox; Shingles (07-2002); and Urinary tract infection.    PAST SURGICAL HISTORY: Past Surgical History:  Procedure Laterality Date  . HYSTEROSCOPY     Dr Ubaldo Glassing  . SPINAL FUSION    . WISDOM TOOTH EXTRACTION     age 72's    FAMILY HISTORY: family history includes Arthritis in her father; Cancer in her maternal aunt; Diabetes in her mother; Heart disease in her paternal grandfather.  SOCIAL HISTORY:  reports that she has quit smoking. Her smoking use included Cigarettes. She has never used smokeless tobacco. She reports that she drinks alcohol. She reports that she does not use drugs.   She was on hormone replacement for 12 - 15 years and stopped 4 years ago. She drinks 1-2 glasses of wine 3 - 4 times a week. She quit smoking in 1982.  ALLERGIES: Patient has no known allergies.  MEDICATIONS:  Current Outpatient Prescriptions  Medication Sig Dispense Refill  . Biotin 2.5 MG CAPS Take 1 capsule by mouth daily.    . Cholecalciferol (VITAMIN D) 2000 UNITS CAPS Take 1 capsule by mouth daily.    . diazepam (VALIUM) 5 MG tablet     .  estradiol (ESTRACE) 0.1 MG/GM vaginal cream Place 1 g vaginally 2 (two) times a week.    Marland Kitchen Fexofenadine HCl (ALLEGRA PO) Take by mouth.    . fluticasone (FLONASE) 50 MCG/ACT nasal spray Place 1 spray into both nostrils daily.    . Glucosamine Sulfate 1500 MG PACK Take 2 capsules by mouth 2 (two) times daily. Plus herbs    . lidocaine (LIDODERM) 5 % Place 1 patch onto the skin as needed. Remove & Discard patch  within 12 hours or as directed by MD    . Methylcellulose, Laxative, (CITRUCEL PO) Take 2 tablets by mouth daily.    . Nutritional Supplements (JUICE PLUS FIBRE PO) Take by mouth.    . Probiotic Product (ADVANCED PROBIOTIC 10) CAPS Take 1 capsule by mouth daily.    . traMADol (ULTRAM) 50 MG tablet Take by mouth.    . Turmeric 500 MG TABS Take 1,000 mg by mouth daily.     No current facility-administered medications for this encounter.     REVIEW OF SYSTEMS: A 10+ POINT REVIEW OF SYSTEMS WAS OBTAINED including neurology, dermatology, psychiatry, cardiac, respiratory, lymph, extremities, GI, GU, Musculoskeletal, constitutional, breasts, reproductive, HEENT.  All pertinent positives are noted in the HPI.  All others are negative.    PHYSICAL EXAM:  Vitals with BMI 04/04/2017  Height 5\' 7"   Weight 141 lbs 2 oz  BMI 81.4  Systolic 481  Diastolic 72  Pulse 79  Respirations 20   General: Alert and oriented, in no acute distress HEENT: Head is normocephalic. Extraocular movements are intact. Oropharynx is clear. + hearing aids Neck: Neck is supple, no palpable cervical or supraclavicular lymphadenopathy. Heart: Regular in rate and rhythm with no murmurs, rubs, or gallops. Chest: Clear to auscultation bilaterally, with no rhonchi, wheezes, or rales. Abdomen: Soft, nontender, nondistended, with no rigidity or guarding. Extremities: No cyanosis or edema. Lymphatics: see Neck Exam Skin: No concerning lesions. Musculoskeletal: symmetric strength and muscle tone throughout. Neurologic: Cranial nerves II through XII are grossly intact. No obvious focalities. Speech is fluent. Coordination is intact. Psychiatric: Judgment and insight are intact. Affect is appropriate. Breasts: Bilateral nipple inversion. Some biopsy change around 9:00 of the left breast, but no palpable tumor. No palpable masses appreciated in the breasts or axillae.  ECOG = 0  0 - Asymptomatic (Fully active, able to carry on  all predisease activities without restriction)  1 - Symptomatic but completely ambulatory (Restricted in physically strenuous activity but ambulatory and able to carry out work of a light or sedentary nature. For example, light housework, office work)  2 - Symptomatic, <50% in bed during the day (Ambulatory and capable of all self care but unable to carry out any work activities. Up and about more than 50% of waking hours)  3 - Symptomatic, >50% in bed, but not bedbound (Capable of only limited self-care, confined to bed or chair 50% or more of waking hours)  4 - Bedbound (Completely disabled. Cannot carry on any self-care. Totally confined to bed or chair)  5 - Death   Eustace Pen MM, Creech RH, Tormey DC, et al. [phone]). "Toxicity and response criteria of the [facility]". [place] Oncol. 5 (6): 649-55   LABORATORY DATA:  Lab Results  Component Value Date   WBC 5.7 04/04/2017   HGB 12.6 04/04/2017   HCT 37.8 04/04/2017   MCV 93.8 04/04/2017   PLT 255 04/04/2017   CMP     Component Value Date/Time   NA  136 04/04/2017 1223   K 4.2 04/04/2017 1223   CL 101 03/15/2017 1041   CO2 27 04/04/2017 1223   GLUCOSE 106 04/04/2017 1223   BUN 11.1 04/04/2017 1223   CREATININE 0.8 04/04/2017 1223   CALCIUM 10.1 04/04/2017 1223   PROT 7.9 04/04/2017 1223   ALBUMIN 4.1 04/04/2017 1223   AST 14 04/04/2017 1223   ALT 8 04/04/2017 1223   ALKPHOS 74 04/04/2017 1223   BILITOT 0.47 04/04/2017 1223         RADIOGRAPHY: Mm Digital Diagnostic Bilat  Result Date: 03/07/2017 CLINICAL DATA:  Calcifications in both breasts on a recent screening mammogram. EXAM: DIGITAL DIAGNOSTIC BILATERAL MAMMOGRAM WITH CAD COMPARISON:  Previous exam(s). ACR Breast Density Category c: The breast tissue is heterogeneously dense, which may obscure small masses. FINDINGS: True lateral and spot magnification views of the left breast demonstrate a 3 x 2 x 2 mm group of multiple tiny, punctate,  rounded calcifications in the 6 o'clock position of the breast. These were slightly laterally located on the screening craniocaudal view. True lateral and spot magnification views of the right breast demonstrate day 3 x 3 x 3 mm group of small, dense, rounded and oval calcifications in the posterior aspect of the upper-outer quadrant of the breast. There is also a 4 x 2 x 1 mm group of tiny, predominantly rounded and oval calcifications in the upper inner quadrant of the right breast posteriorly. These vary in density. Mammographic images were processed with CAD. IMPRESSION: One small group of indeterminate microcalcifications in the left breast and 2 small groups of indeterminate calcifications in the right breast. RECOMMENDATION: Stereotactic guided core needle biopsy of the indeterminate group of microcalcifications in the inferior left breast and stereotactic guided core needle biopsy of one of the groups of microcalcifications in the right breast. The second group of indeterminate calcifications in the right breast can be biopsied on a different day. I have discussed the findings and recommendations with the patient. Results were also provided in writing at the conclusion of the visit. If applicable, a reminder letter will be sent to the patient regarding the next appointment. BI-RADS CATEGORY  4: Suspicious. Electronically Signed   By: Claudie Revering M.D.   On: 03/07/2017 12:19   Mm Clip Placement Left  Result Date: 03/26/2017 CLINICAL DATA:  Patient status post stereotactic guided biopsy left breast calcifications. EXAM: DIAGNOSTIC LEFT MAMMOGRAM POST STEREOTACTIC BIOPSY COMPARISON:  Previous exam(s). FINDINGS: Mammographic images were obtained following stereotactic guided biopsy of left breast calcifications. X shaped marking clip in appropriate position. IMPRESSION: Appropriate position X shaped marking clip status post stereotactic guided biopsy left breast calcifications. Final Assessment: Post  Procedure Mammograms for Marker Placement Electronically Signed   By: Lovey Newcomer M.D.   On: 03/26/2017 13:24   Mm Clip Placement Left  Result Date: 03/07/2017 CLINICAL DATA:  Confirmation clip placement after stereotactic tomosynthesis core needle biopsy of indeterminate calcifications in lower inner quadrant of the left breast. EXAM: DIAGNOSTIC LEFT MAMMOGRAM POST STEREOTACTIC BIOPSY COMPARISON:  Previous exam(s). FINDINGS: Mammographic images were obtained following stereotactic tomosynthesis guided biopsy of calcifications in the lower and slightly inner left breast at middle depth. The coil shaped tissue marker clip is appropriately positioned at site of the calcifications. Expected post biopsy images present without evidence of hematoma. IMPRESSION: Appropriate positioning of the core shaped tissue marker clip at the site of the calcifications in the left breast. Final Assessment: Post Procedure Mammograms for Marker Placement Electronically Signed   By:  Evangeline Dakin M.D.   On: 03/07/2017 17:17   Mm Clip Placement Right  Result Date: 03/26/2017 CLINICAL DATA:  Patient status post stereotactic guided biopsy right breast calcifications. EXAM: DIAGNOSTIC RIGHT MAMMOGRAM POST STEREOTACTIC BIOPSY COMPARISON:  Previous exam(s). FINDINGS: Mammographic images were obtained following stereotactic guided biopsy of right breast calcifications. X shaped marking clip in appropriate position. IMPRESSION: Appropriate position X shaped marking clip status post stereotactic guided biopsy right breast calcifications. Final Assessment: Post Procedure Mammograms for Marker Placement Electronically Signed   By: Lovey Newcomer M.D.   On: 03/26/2017 13:22   Mm Clip Placement Right  Result Date: 03/07/2017 CLINICAL DATA:  Confirmation clip placement after stereotactic tomosynthesis core needle biopsy of indeterminate calcifications in the upper outer quadrant of the right breast at posterior depth. EXAM: DIAGNOSTIC RIGHT  MAMMOGRAM POST STEREOTACTIC BIOPSY COMPARISON:  Previous exam(s). FINDINGS: Mammographic images were obtained following stereotactic tomosynthesis guided biopsy of calcifications in the upper outer right breast at posterior depth. The coil shaped tissue marker clip is appropriately positioned at the site of the calcifications. All the calcifications were removed with the biopsy sample. (On the CC view, the biopsy clip is approximately 4.5 cm posterolateral to a round benign coarse calcification, the same location that the calcifications were identified on the diagnostic mammogram performed earlier today). Expected post biopsy changes are present without evidence of hematoma. IMPRESSION: Appropriate positioning of the coil shaped tissue marker clip at the site of the biopsied calcifications in the upper outer quadrant of the right breast at posterior depth. All of the calcifications were removed with the biopsy. Final Assessment: Post Procedure Mammograms for Marker Placement Electronically Signed   By: Evangeline Dakin M.D.   On: 03/07/2017 15:27   Mm Lt Breast Bx W Loc Dev 1st Lesion Image Bx Spec Stereo Guide  Addendum Date: 03/29/2017   ADDENDUM REPORT: 03/28/2017 08:48 ADDENDUM: Pathology revealed CALCIFICATIONS ASSOCIATED WITH FIBROCYSTIC CHANGES AND ATROPHIC LOBULES of the Right breast, upper inner quadrant. This was found to be concordant by Dr. Lovey Newcomer. Pathology revealed CALCIFICATIONS ASSOCIATED WITH HIGH GRADE DUCTAL CARCINOMA IN SITU AND ATROPHIC LOBULES, ADJACENT TO FLORID BIOPSY SITE REACTION of the Left breast, lower outer quadrant. This was found to be concordant by Dr. Lovey Newcomer, with excision recommended. Pathology results were discussed with the patient by telephone. The patient reported doing well after the biopsies with tenderness at the sites. Post biopsy instructions and care were reviewed and questions were answered. The patient was encouraged to call [facility] for any additional concerns. The patient was referred to [facility] on Apr 04, 2017. Pathology results reported by Terie Purser, RN on 03/28/2017. Electronically Signed   By: Lovey Newcomer M.D.   On: 03/28/2017 08:48   Result Date: 03/29/2017 CLINICAL DATA:  Patient for second biopsy of left breast calcifications as first sample did not demonstrate calcifications within the sample and no lesional tissue was identified. EXAM: LEFT BREAST STEREOTACTIC CORE NEEDLE BIOPSY COMPARISON:  Previous exams. FINDINGS: The patient and I discussed the procedure of stereotactic-guided biopsy including benefits and alternatives. We discussed the high likelihood of a successful procedure. We discussed the risks of the procedure including infection, bleeding, tissue injury, clip migration, and inadequate sampling. Informed written consent was given. The usual time out protocol was performed immediately prior to the procedure. Using sterile technique and 1% Lidocaine as local anesthetic, under stereotactic guidance, a 9 gauge  vacuum assisted device was used to perform core needle biopsy of calcifications within the lower outer left breast using a lateral approach. Specimen radiograph was performed showing calcifications. Specimens with calcifications are identified for pathology. Lesion quadrant: Lower outer quadrant At the conclusion of the procedure, a X shaped tissue marker clip was deployed into the biopsy cavity. Follow-up 2-view mammogram was performed and dictated separately. IMPRESSION: Stereotactic-guided biopsy of left breast calcifications. No apparent complications. Electronically Signed: By: Lovey Newcomer M.D. On: 03/26/2017 13:22   Mm Lt Breast Bx W Loc Dev 1st Lesion Image Bx Spec Stereo Guide  Addendum Date: 03/09/2017   ADDENDUM REPORT: 03/09/2017 14:27 ADDENDUM: Pathology revealed fibrocystic change with microcalcifications and  vascular calcifications in the right breast. The left breast biopsy showed adipose tissue with no calcifications seen. This was found to be concordant for the right breast and discordant for the left breast in the absence of calcifications by Dr. Peggye Fothergill. Pathology results were discussed with the patient by telephone. The patient reported doing well after the biopsies with tenderness at the biopsy sites. Post biopsy instructions and care were reviewed and questions were answered. The patient was encouraged to call [facility] for any additional concerns. The patient is scheduled for a right breast stereotactic biopsy for an additional area of calcifications, and a repeat left breast stereotactic biopsy for the left breast calcifications on Mar 26, 2017. For the repeat left breast stereotactic biopsy, Dr. Purcell Nails recommends using a lateral-medial approach, as this was the only approach that allowed access to the calcifications. Pathology results reported by Susa Raring RN, BSN on 03/09/2017. Electronically Signed   By: Evangeline Dakin M.D.   On: 03/09/2017 14:27   Result Date: 03/09/2017 CLINICAL DATA:  76 year old with screening detected calcifications in both breasts. Diagnostic evaluation earlier today demonstrated a group of fine heterogeneous calcifications in the lower left breast at middle depth. EXAM: LEFT BREAST STEREOTACTIC CORE NEEDLE BIOPSY COMPARISON:  Previous exams. FINDINGS: The patient and I discussed the procedure of stereotactic-guided biopsy including benefits and alternatives. We discussed the high likelihood of a successful procedure. We discussed the risks of the procedure including infection, bleeding, tissue injury, clip migration, and inadequate sampling. Informed written consent was given. The usual time out protocol was performed immediately prior to the procedure. Initially, attempts were made to perform the biopsy from the superior approach,  mediolateral approach, and inferior approach. On the superior and mediolateral approach is, the calcifications were too close to the imaging detector to be biopsied. On the inferior approach, the calcifications were too close to the skin to be biopsied. Therefore, the calcifications were localized using a grid, a BB was placed on the calcifications, and a spot tangential tomosynthesis image was obtained. The calcifications were not in the skin. Therefore, the lateral-medial approach was used. Using sterile technique with chlorhexidine as skin antisepsis, 1% lidocaine and 1% lidocaine with epinephrine as local anesthetic, under stereotactic guidance, a 9 gauge vacuum assisted device was used to perform core needle biopsy of calcifications in the lower inner left breast at middle depth using a lateral-medial approach. The biopsy needle malfunction during the procedure, and only a single sample was obtained, despite attempts at multiple samples. Specimen radiograph was performed showing a possible faint solitary calcification within the core sample. Lesion quadrant: Lower inner quadrant at middle depth. At the conclusion of the procedure, a coil shaped tissue marker clip was deployed into the biopsy cavity. Follow-up 2-view mammogram was performed  and dictated separately. IMPRESSION: Stereotactic-guided biopsy of indeterminate calcifications in the lower inner left breast at middle depth. No apparent complications. Please see above comments. Electronically Signed: By: Evangeline Dakin M.D. On: 03/07/2017 17:09   Mm Rt Breast Bx W Loc Dev 1st Lesion Image Bx Spec Stereo Guide  Result Date: 03/26/2017 CLINICAL DATA:  Indeterminate right breast calcifications. EXAM: RIGHT BREAST STEREOTACTIC CORE NEEDLE BIOPSY COMPARISON:  Previous exams. FINDINGS: The patient and I discussed the procedure of stereotactic-guided biopsy including benefits and alternatives. We discussed the high likelihood of a successful procedure. We  discussed the risks of the procedure including infection, bleeding, tissue injury, clip migration, and inadequate sampling. Informed written consent was given. The usual time out protocol was performed immediately prior to the procedure. Using sterile technique and 1% Lidocaine as local anesthetic, under stereotactic guidance, a 9 gauge vacuum assisted device was used to perform core needle biopsy of calcifications within the upper inner right breast using a cranial approach. Specimen radiograph was performed showing calcifications. Specimens with calcifications are identified for pathology. Lesion quadrant: Upper inner quadrant At the conclusion of the procedure, a X shaped tissue marker clip was deployed into the biopsy cavity. Follow-up 2-view mammogram was performed and dictated separately. IMPRESSION: Stereotactic-guided biopsy of right breast calcifications. No apparent complications. Electronically Signed   By: Lovey Newcomer M.D.   On: 03/26/2017 13:21   Mm Rt Breast Bx W Loc Dev 1st Lesion Image Bx Spec Stereo Guide  Addendum Date: 03/09/2017   ADDENDUM REPORT: 03/09/2017 14:27 ADDENDUM: Pathology revealed fibrocystic change with microcalcifications and vascular calcifications in the right breast. The left breast biopsy showed adipose tissue with no calcifications seen. This was found to be concordant for the right breast and discordant for the left breast in the absence of calcifications by Dr. Peggye Fothergill. Pathology results were discussed with the patient by telephone. The patient reported doing well after the biopsies with tenderness at the biopsy sites. Post biopsy instructions and care were reviewed and questions were answered. The patient was encouraged to call [facility] for any additional concerns. The patient is scheduled for a right breast stereotactic biopsy for an additional area of calcifications, and a repeat left breast stereotactic biopsy for the left breast  calcifications on Mar 26, 2017. For the repeat left breast stereotactic biopsy, Dr. Purcell Nails recommends using a lateral-medial approach, as this was the only approach that allowed access to the calcifications. Pathology results reported by Susa Raring RN, BSN on 03/09/2017. Electronically Signed   By: Evangeline Dakin M.D.   On: 03/09/2017 14:27   Result Date: 03/09/2017 CLINICAL DATA:  76 year old with screening detected calcifications in both breasts. Diagnostic evaluation earlier today demonstrated separate groups of coarse heterogeneous calcifications heterogeneous calcifications involving the upper outer quadrant and fine heterogeneous calcifications in the upper inner quadrant. Biopsy of the coarse heterogeneous calcifications in the upper outer quadrant is performed. EXAM: RIGHT BREAST STEREOTACTIC CORE NEEDLE BIOPSY COMPARISON:  Previous exams. FINDINGS: The patient and I discussed the procedure of stereotactic-guided biopsy including benefits and alternatives. We discussed the high likelihood of a successful procedure. We discussed the risks of the procedure including infection, bleeding, tissue injury, clip migration, and inadequate sampling. Informed written consent was given. The usual time out protocol was performed immediately prior to the procedure. Using sterile technique with chlorhexidine as skin antisepsis, 1% lidocaine and 1% lidocaine with epinephrine as local anesthetic, under stereotactic guidance, a 9 gauge vacuum assisted device was used  to perform core needle biopsy of calcifications in the upper outer quadrant of the right breast using a mediolateral approach. Specimen radiographs were performed using the [place] device showing calcifications in at least 2 of the core samples. Specimens with calcifications are identified for pathology. Lesion quadrant: Upper outer quadrant at posterior depth. At the conclusion of the procedure, a coil shaped tissue marker clip was deployed into the biopsy  cavity. Follow-up 2-view mammogram was performed and dictated separately. IMPRESSION: Stereotactic-guided biopsy of coarse heterogeneous calcifications involving the upper outer quadrant of the right breast. No apparent complications. Electronically Signed: By: Evangeline Dakin M.D. 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  The consensus is that she would be a good candidate for breast conservation. I talked to her about the option of a mastectomy and informed her that her expected overall survival would be equivalent between mastectomy and breast conservation, based upon randomized controlled data. She is enthusiastic about breast conservation.  It was a pleasure meeting the patient today. We discussed the risks, benefits, and side effects of radiotherapy. I recommend radiotherapy to the left breast to reduce her risk of locoregional recurrence by 1/2.  We discussed that radiation would take approximately 4 weeks to complete and that I would give the patient a few weeks to heal following surgery before starting treatment planning. We spoke about acute effects including skin irritation and fatigue as well as much less common late effects including internal organ injury or irritation. We spoke about the latest technology that is used to minimize the risk of late effects for patients undergoing radiotherapy to the breast or chest wall. No guarantees of treatment were given. The patient is enthusiastic about proceeding with treatment. I look forward to participating in the patient's care.  I will await her referral back to me for postoperative follow-up and eventual CT simulation/treatment planning.  The patient has a trip June 25 - July 11 to [place] and [place]. It is unclear if the surgery would be before or after her trip. Patient to discuss w/ Dr. Marlou Starks.    __________________________________________   Eppie Gibson, MD  This document serves as a record of services personally performed by Eppie Gibson, MD. It was created on her behalf by Darcus Austin, a trained medical scribe. The creation of this record is based on the scribe's personal observations and the provider's statements to them. This document has been checked and approved by the attending provider.

## 2017-04-04 NOTE — Assessment & Plan Note (Addendum)
03/26/2017: Screening detected bilateral calcifications. 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 It is considered a precancerous lesion. DCIS is classified as a 0. It is generally detected through mammograms as calcifications. We discussed the significance of grades and its impact on prognosis. We also discussed the importance of ER and PR receptors and their implications to adjuvant treatment options. Prognosis of DCIS dependence on grade, comedo necrosis. It is anticipated that if not treated, 20-30% of DCIS can develop into invasive breast cancer.  Recommendation: 1. Breast conserving surgery 2. Followed by adjuvant radiation therapy 3. Followed by antiestrogen therapy with tamoxifen 5 years  Tamoxifen counseling: We discussed the risks and benefits of tamoxifen. These include but not limited to insomnia, hot flashes, mood changes, vaginal dryness, and weight gain. Although rare, serious side effects including endometrial cancer, risk of blood clots were also discussed. We strongly believe that the benefits far outweigh the risks. Patient understands these risks and consented to starting treatment. Planned treatment duration is 5 years.

## 2017-04-04 NOTE — Progress Notes (Addendum)
[facility] NOTE  Patient Care Team: Midge Minium, MD as PCP - General (Family Medicine) Paula Compton, MD as Consulting Physician (Obstetrics and Gynecology) Gloris Manchester, MD (Neurosurgery) Regal, Tamala Fothergill, DPM as Consulting Physician (Podiatry) Marica Otter, OD (Optometry) Jovita Kussmaul, MD as Consulting Physician (General Surgery) Nicholas Lose, MD as Consulting Physician (Hematology and Oncology) Eppie Gibson, MD as Attending Physician (Radiation Oncology)  CHIEF COMPLAINTS/PURPOSE OF CONSULTATION:  Newly diagnosed left breast DCIS  HISTORY OF PRESENTING ILLNESS:  Elizabeth [name] 76 y.o. female is here because of recent diagnosis of left breast DCIS. Patient had a screening mammogram that detected calcifications. These by 3 mm at 6:00 position. Initial biopsy was discordant but a subsequent biopsy revealed high-grade DCIS that was ER/PR positive. She was presented this morning in the multidisciplinary tumor board and she is here today to discuss the treatment plan accompanied by her husband. Previously patient was on estrogen replacement therapy with a patch for severe hot flashes and she was able to come off of it four years ago. Recently she underwent a spinal fusion surgery.  I reviewed her records extensively and collaborated the history with the patient.  SUMMARY OF ONCOLOGIC HISTORY:   Malignant neoplasm of lower-outer quadrant of left breast of female, estrogen receptor positive ([place])   03/26/2017 Initial Diagnosis    Screening detected bilateral calcifications. Left breast spanning 3 mm at 6:00 position, Left breast biopsy lower outer quadrant: Calcifications with high-grade DCIS, ER/PR positive Tis N0 stage 0      MEDICAL HISTORY:  Past Medical History:  Diagnosis Date  . Arthritis   . Chicken pox   . Shingles 07-2002  . Urinary tract infection     SURGICAL HISTORY: Past Surgical History:  Procedure Laterality Date  .  HYSTEROSCOPY     Dr Ubaldo Glassing  . SPINAL FUSION    . WISDOM TOOTH EXTRACTION     age 21's    SOCIAL HISTORY: Social History   Social History  . Marital status: Married    Spouse name: N/A  . Number of children: N/A  . Years of education: N/A   Occupational History  . Not on file.   Social History Main Topics  . Smoking status: Former Smoker    Types: Cigarettes  . Smokeless tobacco: Never Used  . Alcohol use Yes     Comment: 1 beer or wine per day  . Drug use: No  . Sexual activity: Not on file   Other Topics Concern  . Not on file   Social History Narrative  . No narrative on file    FAMILY HISTORY: Family History  Problem Relation Age of Onset  . Diabetes Mother   . Arthritis Father   . Cancer Maternal Aunt        liver  . Heart disease Paternal Grandfather   . Colon cancer Neg Hx   . Pancreatic cancer Neg Hx   . Stomach cancer Neg Hx     ALLERGIES:  has No Known Allergies.  MEDICATIONS:  Current Outpatient Prescriptions  Medication Sig Dispense Refill  . Biotin 2.5 MG CAPS Take 1 capsule by mouth daily.    . Cholecalciferol (VITAMIN D) 2000 UNITS CAPS Take 1 capsule by mouth daily.    . diazepam (VALIUM) 5 MG tablet     . estradiol (ESTRACE) 0.1 MG/GM vaginal cream Place 1 g vaginally 2 (two) times a week.    Marland Kitchen Fexofenadine HCl (ALLEGRA PO) Take by mouth.    Marland Kitchen  fluticasone (FLONASE) 50 MCG/ACT nasal spray Place 1 spray into both nostrils daily.    . Glucosamine Sulfate 1500 MG PACK Take 2 capsules by mouth 2 (two) times daily. Plus herbs    . lidocaine (LIDODERM) 5 % Place 1 patch onto the skin as needed. Remove & Discard patch within 12 hours or as directed by MD    . Methylcellulose, Laxative, (CITRUCEL PO) Take 2 tablets by mouth daily.    . Nutritional Supplements (JUICE PLUS FIBRE PO) Take by mouth.    . Probiotic Product (ADVANCED PROBIOTIC 10) CAPS Take 1 capsule by mouth daily.    . traMADol (ULTRAM) 50 MG tablet Take by mouth.    . Turmeric 500  MG TABS Take 1,000 mg by mouth daily.     No current facility-administered medications for this visit.     REVIEW OF SYSTEMS:   Constitutional: Denies fevers, chills or abnormal night sweats Eyes: Denies blurriness of vision, double vision or watery eyes Ears, nose, mouth, throat, and face: Denies mucositis or sore throat Respiratory: Denies cough, dyspnea or wheezes Cardiovascular: Denies palpitation, chest discomfort or lower extremity swelling Gastrointestinal:  Denies nausea, heartburn or change in bowel habits Skin: Denies abnormal skin rashes Lymphatics: Denies new lymphadenopathy or easy bruising Neurological:Denies numbness, tingling or new weaknesses Behavioral/Psych: Mood is stable, no new changes  Breast:  Denies any palpable lumps or discharge All other systems were reviewed with the patient and are negative.  PHYSICAL EXAMINATION: ECOG PERFORMANCE STATUS: 1 - Symptomatic but completely ambulatory  Vitals:   04/04/17 1237  BP: (!) 152/72  Pulse: 79  Resp: 20  Temp: 97.9 F (36.6 C)   Filed Weights   04/04/17 1237  Weight: 141 lb 1.6 oz (64 kg)    GENERAL:alert, no distress and comfortable SKIN: skin color, texture, turgor are normal, no rashes or significant lesions EYES: normal, conjunctiva are pink and non-injected, sclera clear OROPHARYNX:no exudate, no erythema and lips, buccal mucosa, and tongue normal  NECK: supple, thyroid normal size, non-tender, without nodularity LYMPH:  no palpable lymphadenopathy in the cervical, axillary or inguinal LUNGS: clear to auscultation and percussion with normal breathing effort HEART: regular rate & rhythm and no murmurs and no lower extremity edema ABDOMEN:abdomen soft, non-tender and normal bowel sounds Musculoskeletal:no cyanosis of digits and no clubbing  PSYCH: alert & oriented x 3 with fluent speech NEURO: no focal motor/sensory deficits BREAST: No palpable nodules in breast. No palpable axillary or  supraclavicular lymphadenopathy (exam performed in the presence of a chaperone)   LABORATORY DATA:  I have reviewed the data as listed Lab Results  Component Value Date   WBC 5.7 04/04/2017   HGB 12.6 04/04/2017   HCT 37.8 04/04/2017   MCV 93.8 04/04/2017   PLT 255 04/04/2017   Lab Results  Component Value Date   NA 136 04/04/2017   K 4.2 04/04/2017   CL 101 03/15/2017   CO2 27 04/04/2017    RADIOGRAPHIC STUDIES: I have personally reviewed the radiological reports and agreed with the findings in the report.  ASSESSMENT AND PLAN:  Malignant neoplasm of lower-outer quadrant of left breast of female, estrogen receptor positive ([place]) 03/26/2017: Screening detected bilateral calcifications. 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 It is considered a precancerous lesion. DCIS is classified as  a 0. It is generally detected through mammograms as calcifications. We discussed the significance of grades and its impact on prognosis. We also discussed the importance of ER and PR receptors and their implications to adjuvant treatment options. Prognosis of DCIS dependence on grade, comedo necrosis. It is anticipated that if not treated, 20-30% of DCIS can develop into invasive breast cancer.  Recommendation: 1. Breast conserving surgery 2. Followed by adjuvant radiation therapy 3. Followed by antiestrogen therapy with tamoxifen 5 years  Tamoxifen counseling: We discussed the risks and benefits of tamoxifen. These include but not limited to insomnia, hot flashes, mood changes, vaginal dryness, and weight gain. Although rare, serious side effects including endometrial cancer, risk of blood clots were also discussed. We strongly believe that the benefits far outweigh the risks. Patient  understands these risks and consented to starting treatment. Planned treatment duration is 5 years.  Addendum: After lengthy discussion patient determined that she would not be interested in taking antiestrogen therapy. She is very much concerned about the risk of hot flashes and its effect on quality of life. She has had an extremely rough time during menopause. I will see her back after surgery to go over the final pathology and after that she could be followed by Dr. Marlou Starks for her surveillance.   All questions were answered. The patient knows to call the clinic with any problems, questions or concerns.    Rulon Eisenmenger, MD 04/04/17

## 2017-04-05 DIAGNOSIS — M5441 Lumbago with sciatica, right side: Secondary | ICD-10-CM | POA: Diagnosis not present

## 2017-04-10 ENCOUNTER — Telehealth: Payer: Self-pay | Admitting: *Deleted

## 2017-04-10 DIAGNOSIS — M5441 Lumbago with sciatica, right side: Secondary | ICD-10-CM | POA: Diagnosis not present

## 2017-04-10 NOTE — Telephone Encounter (Signed)
Spoke to pt concerning [place] from 5.16.18. Denies question or concerns regarding dx or treatment care plan. Encourage pt to call with needs. Received verbal understanding.

## 2017-04-13 ENCOUNTER — Telehealth: Payer: Self-pay | Admitting: Family Medicine

## 2017-04-13 NOTE — Telephone Encounter (Signed)
Please advise 

## 2017-04-13 NOTE — Telephone Encounter (Signed)
Ok to take Doxy twice daily w/ food but this will make her sun sensitive if at the beach.  If no improvement, will need OV when she gets home

## 2017-04-13 NOTE — Telephone Encounter (Signed)
[facility]  Patient Name: Elizabeth [name]  DOB: 06-24-1941    Initial Comment caller states she has chest congestion an sore throat an fever of 99.4. They are out of town . She had spinal fusion in January . She is taking tramadol for pain an tylenol . She is having breast surgery in June an needs to be well for that . Her husband had this an was prescribed doxycycline .Her question today is, she wants to know if she can take her husbands doxycycline since they are out of town .    Nurse Assessment  Nurse: Yolanda Bonine, RN, Erin Date/Time (Eastern Time): 04/13/2017 10:51:51 AM  Confirm and document reason for call. If symptomatic, describe symptoms. ---caller states has chest congestion and sore throat. Productive cough with light tan sputum. 100.5 oral temp. last night. 99.0 oral this morning. Recent DX of Breast cancer to have surgery June 1st.  Does the patient have any new or worsening symptoms? ---Yes  Will a triage be completed? ---Yes  Related visit to physician within the last 2 weeks? ---No  Does the PT have any chronic conditions? (i.e. diabetes, asthma, etc.) ---Yes  List chronic conditions. ---chronic pain back Breast Cancer Left breast  Is this a behavioral health or substance abuse call? ---No     Guidelines    Guideline Title Affirmed Question Affirmed Notes  Cough - Acute Productive Cough    Final Disposition Jagual, RN, Junie Panning    Comments  fever 100.5 last night

## 2017-04-13 NOTE — Telephone Encounter (Signed)
Patient notified of PCP recommendations and is agreement and expresses an understanding.  

## 2017-04-20 DIAGNOSIS — M5441 Lumbago with sciatica, right side: Secondary | ICD-10-CM | POA: Diagnosis not present

## 2017-04-23 DIAGNOSIS — M5441 Lumbago with sciatica, right side: Secondary | ICD-10-CM | POA: Diagnosis not present

## 2017-04-24 ENCOUNTER — Other Ambulatory Visit: Payer: Self-pay | Admitting: General Surgery

## 2017-04-24 DIAGNOSIS — D0512 Intraductal carcinoma in situ of left breast: Secondary | ICD-10-CM

## 2017-04-25 ENCOUNTER — Telehealth: Payer: Self-pay | Admitting: Hematology and Oncology

## 2017-04-25 NOTE — Telephone Encounter (Signed)
sw pt to confirm 7/17 appt at 2 pm. Pt will be out of town for 6/26 appt. sched per pt request

## 2017-04-26 ENCOUNTER — Encounter: Payer: Self-pay | Admitting: *Deleted

## 2017-04-30 ENCOUNTER — Encounter (HOSPITAL_COMMUNITY)
Admission: RE | Admit: 2017-04-30 | Discharge: 2017-04-30 | Disposition: A | Discharge status: Home / Self Care | Payer: Medicare HMO | Source: Ambulatory Visit | Attending: General Surgery | Admitting: General Surgery

## 2017-04-30 ENCOUNTER — Encounter (HOSPITAL_COMMUNITY): Payer: Self-pay

## 2017-04-30 DIAGNOSIS — D0512 Intraductal carcinoma in situ of left breast: Secondary | ICD-10-CM | POA: Diagnosis present

## 2017-04-30 DIAGNOSIS — Z87891 Personal history of nicotine dependence: Secondary | ICD-10-CM | POA: Diagnosis not present

## 2017-04-30 DIAGNOSIS — M1990 Unspecified osteoarthritis, unspecified site: Secondary | ICD-10-CM | POA: Diagnosis not present

## 2017-04-30 DIAGNOSIS — Z8261 Family history of arthritis: Secondary | ICD-10-CM | POA: Diagnosis not present

## 2017-04-30 DIAGNOSIS — Z79899 Other long term (current) drug therapy: Secondary | ICD-10-CM | POA: Diagnosis not present

## 2017-04-30 DIAGNOSIS — G4730 Sleep apnea, unspecified: Secondary | ICD-10-CM | POA: Diagnosis not present

## 2017-04-30 DIAGNOSIS — Z833 Family history of diabetes mellitus: Secondary | ICD-10-CM | POA: Diagnosis not present

## 2017-04-30 HISTORY — DX: Malignant (primary) neoplasm, unspecified: C80.1

## 2017-04-30 HISTORY — DX: Sleep apnea, unspecified: G47.30

## 2017-04-30 LAB — BASIC METABOLIC PANEL
Anion gap: 7 (ref 5–15)
BUN: 8 mg/dL (ref 6–20)
CHLORIDE: 102 mmol/L (ref 101–111)
CO2: 25 mmol/L (ref 22–32)
Calcium: 9.8 mg/dL (ref 8.9–10.3)
Creatinine, Ser: 0.67 mg/dL (ref 0.44–1.00)
GFR calc Af Amer: >60 mL/min (ref >60)
GFR calc non Af Amer: >60 mL/min (ref >60)
GLUCOSE: 102 mg/dL — AB (ref 65–99)
POTASSIUM: 4.3 mmol/L (ref 3.5–5.1)
Sodium: 134 mmol/L — ABNORMAL LOW (ref 135–145)

## 2017-04-30 LAB — CBC
HCT: 34.6 % — ABNORMAL LOW (ref 36.0–46.0)
HEMOGLOBIN: 11.5 g/dL — AB (ref 12.0–15.0)
MCH: 30.9 pg (ref 26.0–34.0)
MCHC: 33.2 g/dL (ref 30.0–36.0)
MCV: 93 fL (ref 78.0–100.0)
Platelets: 375 10*3/uL (ref 150–400)
RBC: 3.72 MIL/uL — AB (ref 3.87–5.11)
RDW: 12.8 % (ref 11.5–15.5)
WBC: 5.3 10*3/uL (ref 4.0–10.5)

## 2017-04-30 NOTE — Progress Notes (Addendum)
PCP is- Dr. Annye Asa  Cardiologist is-Carolyn Gala Romney states she had to see before back surgery done at [facility]- Denies Echo- Denies Stress test-Denies Instructed to drink boost by 1030 on the day of surgery.  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  Denies any fever, or chest pain, reports a dry cough at times.

## 2017-04-30 NOTE — Pre-Procedure Instructions (Signed)
Elizabeth [name]  04/30/2017      [id number] - [place], [place] - [address] AT [address] [place] [place] [address] Phone: [phone] Fax: [phone]    Your procedure is scheduled on June 14.  Report to [facility] Admitting at [phone]   Call this number if you have problems the morning of surgery:  [phone]   Remember:  Do not eat food or drink liquids after midnight.  Take these medicines the morning of surgery with A SIP OF WATER Tylenol if needed, diazepam (Valium) if needed, fexofenadine (Allegra), tramadol (Ultram) if needed   Stop taking Herbal medications, Fish Oil, Aleve, Ibuprofen, Advil, Motrin, Aspirin, BC's, Goody's Vitamins    Do not wear jewelry, make-up or nail polish.  Do not wear lotions, powders, or perfumes, or deoderant.  Do not shave 48 hours prior to surgery.  Men may shave face and neck.  Do not bring valuables to the hospital.  [facility] is not responsible for any belongings or valuables.  Contacts, dentures or bridgework may not be worn into surgery.  Leave your suitcase in the car.  After surgery it may be brought to your room.  For patients admitted to the hospital, discharge time will be determined by your treatment team.  Patients discharged the day of surgery will not be allowed to drive home.    Special instructions:  [facility] - Preparing for Surgery  Before surgery, you can play an important role.  Because skin is not sterile, your skin needs to be as free of germs as possible.  You can reduce the number of germs on you skin by washing with CHG (chlorahexidine gluconate) soap before surgery.  CHG is an antiseptic cleaner which kills germs and bonds with the skin to continue killing germs even after washing.  Please DO NOT use if you have an allergy to CHG or antibacterial soaps.  If your skin becomes reddened/irritated stop using the CHG and inform your nurse when  you arrive at Short Stay.  Do not shave (including legs and underarms) for at least 48 hours prior to the first CHG shower.  You may shave your face.  Please follow these instructions carefully:   1.  Shower with CHG Soap the night before surgery and the   morning of Surgery.  2.  If you choose to wash your hair, wash your hair first as usual with your  normal shampoo.  3.  After you shampoo, rinse your hair and body thoroughly to remove the  Shampoo.  4.  Use CHG as you would any other liquid soap.  You can apply chg directly to the skin and wash gently with scrungie or a clean washcloth.  5.  Apply the CHG Soap to your body ONLY FROM THE NECK DOWN.  Do not use on open wounds or open sores.  Avoid contact with your eyes, ears, mouth and genitals (private parts).  Wash genitals (private parts)   with your normal soap.  6.  Wash thoroughly, paying special attention to the area where your surgery  will be performed.  7.  Thoroughly rinse your body with warm water from the neck down.  8.  DO NOT shower/wash with your normal soap after using and rinsing off  the CHG Soap.  9.  Pat yourself dry with a clean towel.            10.  Wear clean pajamas.  11.  Place clean sheets on your bed the night of your first shower and do not  sleep with pets.  Day of Surgery  Do not apply any lotions/deoderants the morning of surgery.  Please wear clean clothes to the hospital/surgery center.    Please read over the following fact sheets that you were given. Pain Booklet, Coughing and Deep Breathing and Surgical Site Infection Prevention

## 2017-05-01 ENCOUNTER — Encounter: Payer: Self-pay | Admitting: Radiation Oncology

## 2017-05-02 ENCOUNTER — Ambulatory Visit
Admission: RE | Admit: 2017-05-02 | Discharge: 2017-05-02 | Disposition: A | Discharge status: Home / Self Care | Payer: Medicare HMO | Source: Ambulatory Visit | Attending: General Surgery | Admitting: General Surgery

## 2017-05-02 DIAGNOSIS — D0512 Intraductal carcinoma in situ of left breast: Secondary | ICD-10-CM | POA: Diagnosis not present

## 2017-05-03 ENCOUNTER — Ambulatory Visit (HOSPITAL_COMMUNITY): Payer: Medicare HMO | Admitting: Certified Registered Nurse Anesthetist

## 2017-05-03 ENCOUNTER — Encounter (HOSPITAL_COMMUNITY): Payer: Self-pay | Admitting: *Deleted

## 2017-05-03 ENCOUNTER — Encounter (HOSPITAL_COMMUNITY)
Admission: RE | Disposition: A | Discharge status: Home / Self Care | Payer: Self-pay | Source: Ambulatory Visit | Attending: General Surgery

## 2017-05-03 ENCOUNTER — Ambulatory Visit
Admission: RE | Admit: 2017-05-03 | Discharge: 2017-05-03 | Disposition: A | Discharge status: Home / Self Care | Payer: Medicare HMO | Source: Ambulatory Visit | Attending: General Surgery | Admitting: General Surgery

## 2017-05-03 ENCOUNTER — Ambulatory Visit (HOSPITAL_COMMUNITY)
Admission: RE | Admit: 2017-05-03 | Discharge: 2017-05-03 | Disposition: A | Discharge status: Home / Self Care | Payer: Medicare HMO | Source: Ambulatory Visit | Attending: General Surgery | Admitting: General Surgery

## 2017-05-03 DIAGNOSIS — Z833 Family history of diabetes mellitus: Secondary | ICD-10-CM | POA: Diagnosis not present

## 2017-05-03 DIAGNOSIS — G4730 Sleep apnea, unspecified: Secondary | ICD-10-CM | POA: Diagnosis not present

## 2017-05-03 DIAGNOSIS — D0512 Intraductal carcinoma in situ of left breast: Secondary | ICD-10-CM | POA: Diagnosis not present

## 2017-05-03 DIAGNOSIS — Z79899 Other long term (current) drug therapy: Secondary | ICD-10-CM | POA: Insufficient documentation

## 2017-05-03 DIAGNOSIS — Z87891 Personal history of nicotine dependence: Secondary | ICD-10-CM | POA: Diagnosis not present

## 2017-05-03 DIAGNOSIS — Z8261 Family history of arthritis: Secondary | ICD-10-CM | POA: Diagnosis not present

## 2017-05-03 DIAGNOSIS — E785 Hyperlipidemia, unspecified: Secondary | ICD-10-CM | POA: Diagnosis not present

## 2017-05-03 DIAGNOSIS — M1990 Unspecified osteoarthritis, unspecified site: Secondary | ICD-10-CM | POA: Diagnosis not present

## 2017-05-03 DIAGNOSIS — C50512 Malignant neoplasm of lower-outer quadrant of left female breast: Secondary | ICD-10-CM | POA: Diagnosis not present

## 2017-05-03 DIAGNOSIS — M545 Low back pain: Secondary | ICD-10-CM | POA: Diagnosis not present

## 2017-05-03 HISTORY — PX: BREAST LUMPECTOMY WITH RADIOACTIVE SEED LOCALIZATION: SHX6424

## 2017-05-03 HISTORY — PX: BREAST LUMPECTOMY: SHX2

## 2017-05-03 SURGERY — BREAST LUMPECTOMY WITH RADIOACTIVE SEED LOCALIZATION
Anesthesia: General | Site: Breast | Laterality: Left

## 2017-05-03 MED ORDER — ROCURONIUM BROMIDE 10 MG/ML (PF) SYRINGE
PREFILLED_SYRINGE | INTRAVENOUS | Status: AC
  Filled 2017-05-03: qty 5

## 2017-05-03 MED ORDER — FENTANYL CITRATE (PF) 250 MCG/5ML IJ SOLN
INTRAMUSCULAR | Status: AC
  Filled 2017-05-03: qty 5

## 2017-05-03 MED ORDER — CEFAZOLIN SODIUM-DEXTROSE 2-4 GM/100ML-% IV SOLN
2.0000 g | INTRAVENOUS | Status: AC
  Administered 2017-05-03: 2 g via INTRAVENOUS

## 2017-05-03 MED ORDER — LIDOCAINE HCL (CARDIAC) 20 MG/ML IV SOLN
INTRAVENOUS | Status: DC | PRN
  Administered 2017-05-03: 100 mg via INTRAVENOUS

## 2017-05-03 MED ORDER — PROPOFOL 10 MG/ML IV BOLUS
INTRAVENOUS | Status: DC | PRN
  Administered 2017-05-03: 150 mg via INTRAVENOUS

## 2017-05-03 MED ORDER — DIPHENHYDRAMINE HCL 50 MG/ML IJ SOLN
INTRAMUSCULAR | Status: DC | PRN
  Administered 2017-05-03: 12.5 mg via INTRAVENOUS

## 2017-05-03 MED ORDER — HYDROCODONE-ACETAMINOPHEN 5-325 MG PO TABS: 1.0000 | ORAL_TABLET | ORAL | 0 refills | Status: DC | PRN

## 2017-05-03 MED ORDER — CEFAZOLIN SODIUM-DEXTROSE 2-4 GM/100ML-% IV SOLN
INTRAVENOUS | Status: AC
  Filled 2017-05-03: qty 100

## 2017-05-03 MED ORDER — CELECOXIB 200 MG PO CAPS
ORAL_CAPSULE | ORAL | Status: AC
  Filled 2017-05-03: qty 2

## 2017-05-03 MED ORDER — GABAPENTIN 300 MG PO CAPS
ORAL_CAPSULE | ORAL | Status: AC
  Filled 2017-05-03: qty 1

## 2017-05-03 MED ORDER — ONDANSETRON HCL 4 MG/2ML IJ SOLN: 4.0000 mg | Freq: Once | INTRAMUSCULAR | Status: DC | PRN

## 2017-05-03 MED ORDER — MIDAZOLAM HCL 5 MG/5ML IJ SOLN
INTRAMUSCULAR | Status: DC | PRN
  Administered 2017-05-03: 2 mg via INTRAVENOUS

## 2017-05-03 MED ORDER — DEXAMETHASONE SODIUM PHOSPHATE 10 MG/ML IJ SOLN
INTRAMUSCULAR | Status: DC | PRN
  Administered 2017-05-03: 10 mg via INTRAVENOUS

## 2017-05-03 MED ORDER — FENTANYL CITRATE (PF) 100 MCG/2ML IJ SOLN
INTRAMUSCULAR | Status: DC | PRN
  Administered 2017-05-03: 50 ug via INTRAVENOUS

## 2017-05-03 MED ORDER — BUPIVACAINE-EPINEPHRINE 0.25% -1:200000 IJ SOLN
INTRAMUSCULAR | Status: DC | PRN
  Administered 2017-05-03: 30 mL

## 2017-05-03 MED ORDER — ACETAMINOPHEN 500 MG PO TABS
ORAL_TABLET | ORAL | Status: AC
  Filled 2017-05-03: qty 2

## 2017-05-03 MED ORDER — CELECOXIB 200 MG PO CAPS
400.0000 mg | ORAL_CAPSULE | ORAL | Status: AC
  Administered 2017-05-03: 400 mg via ORAL

## 2017-05-03 MED ORDER — HYDROMORPHONE HCL 1 MG/ML IJ SOLN: 0.2500 mg | INTRAMUSCULAR | Status: DC | PRN

## 2017-05-03 MED ORDER — EPHEDRINE 5 MG/ML INJ
INTRAVENOUS | Status: AC
  Filled 2017-05-03: qty 10

## 2017-05-03 MED ORDER — CHLORHEXIDINE GLUCONATE CLOTH 2 % EX PADS
6.0000 | MEDICATED_PAD | Freq: Once | CUTANEOUS | Status: DC
Start: 2017-05-03 — End: 2017-05-03

## 2017-05-03 MED ORDER — GABAPENTIN 300 MG PO CAPS
300.0000 mg | ORAL_CAPSULE | ORAL | Status: AC
  Administered 2017-05-03: 300 mg via ORAL

## 2017-05-03 MED ORDER — MEPERIDINE HCL 25 MG/ML IJ SOLN: 6.2500 mg | INTRAMUSCULAR | Status: DC | PRN

## 2017-05-03 MED ORDER — EPHEDRINE SULFATE 50 MG/ML IJ SOLN
INTRAMUSCULAR | Status: DC | PRN
  Administered 2017-05-03 (×3): 5 mg via INTRAVENOUS

## 2017-05-03 MED ORDER — DIPHENHYDRAMINE HCL 50 MG/ML IJ SOLN
INTRAMUSCULAR | Status: AC
  Filled 2017-05-03: qty 1

## 2017-05-03 MED ORDER — ONDANSETRON HCL 4 MG/2ML IJ SOLN
INTRAMUSCULAR | Status: DC | PRN
  Administered 2017-05-03: 4 mg via INTRAVENOUS

## 2017-05-03 MED ORDER — ONDANSETRON HCL 4 MG/2ML IJ SOLN
INTRAMUSCULAR | Status: AC
  Filled 2017-05-03: qty 2

## 2017-05-03 MED ORDER — ACETAMINOPHEN 500 MG PO TABS
1000.0000 mg | ORAL_TABLET | ORAL | Status: AC
  Administered 2017-05-03: 1000 mg via ORAL

## 2017-05-03 MED ORDER — LACTATED RINGERS IV SOLN
INTRAVENOUS | Status: DC
  Administered 2017-05-03 (×2): via INTRAVENOUS

## 2017-05-03 MED ORDER — BUPIVACAINE-EPINEPHRINE (PF) 0.25% -1:200000 IJ SOLN
INTRAMUSCULAR | Status: AC
  Filled 2017-05-03: qty 30

## 2017-05-03 MED ORDER — 0.9 % SODIUM CHLORIDE (POUR BTL) OPTIME
TOPICAL | Status: DC | PRN
Start: 2017-05-03 — End: 2017-05-03
  Administered 2017-05-03: 1000 mL

## 2017-05-03 MED ORDER — SUGAMMADEX SODIUM 200 MG/2ML IV SOLN
INTRAVENOUS | Status: AC
  Filled 2017-05-03: qty 2

## 2017-05-03 MED ORDER — MIDAZOLAM HCL 2 MG/2ML IJ SOLN
INTRAMUSCULAR | Status: AC
  Filled 2017-05-03: qty 2

## 2017-05-03 MED ORDER — PROPOFOL 10 MG/ML IV BOLUS
INTRAVENOUS | Status: AC
  Filled 2017-05-03: qty 40

## 2017-05-03 MED ORDER — LIDOCAINE 2% (20 MG/ML) 5 ML SYRINGE
INTRAMUSCULAR | Status: AC
  Filled 2017-05-03: qty 5

## 2017-05-03 MED ORDER — CHLORHEXIDINE GLUCONATE CLOTH 2 % EX PADS: 6.0000 | MEDICATED_PAD | Freq: Once | CUTANEOUS | Status: DC

## 2017-05-03 MED ORDER — DEXAMETHASONE SODIUM PHOSPHATE 10 MG/ML IJ SOLN
INTRAMUSCULAR | Status: AC
  Filled 2017-05-03: qty 1

## 2017-05-03 SURGICAL SUPPLY — 38 items
ADH SKN CLS APL DERMABOND .7 (GAUZE/BANDAGES/DRESSINGS) ×1
APPLIER CLIP 9.375 MED OPEN (MISCELLANEOUS)
APR CLP MED 9.3 20 MLT OPN (MISCELLANEOUS)
BLADE SURG 15 STRL LF DISP TIS (BLADE) ×1 IMPLANT
BLADE SURG 15 STRL SS (BLADE) ×2
CANISTER SUCT 3000ML PPV (MISCELLANEOUS) ×2 IMPLANT
CHLORAPREP W/TINT 26ML (MISCELLANEOUS) ×2 IMPLANT
CLIP APPLIE 9.375 MED OPEN (MISCELLANEOUS) IMPLANT
COVER PROBE W GEL 5X96 (DRAPES) ×2 IMPLANT
COVER SURGICAL LIGHT HANDLE (MISCELLANEOUS) ×2 IMPLANT
DERMABOND ADVANCED (GAUZE/BANDAGES/DRESSINGS) ×1
DERMABOND ADVANCED .7 DNX12 (GAUZE/BANDAGES/DRESSINGS) ×1 IMPLANT
DEVICE DUBIN SPECIMEN MAMMOGRA (MISCELLANEOUS) ×2 IMPLANT
DRAPE CHEST BREAST 15X10 FENES (DRAPES) ×2 IMPLANT
DRAPE UTILITY XL STRL (DRAPES) ×2 IMPLANT
ELECT COATED BLADE 2.86 ST (ELECTRODE) ×2 IMPLANT
ELECT REM PT RETURN 9FT ADLT (ELECTROSURGICAL) ×2
ELECTRODE REM PT RTRN 9FT ADLT (ELECTROSURGICAL) ×1 IMPLANT
GLOVE BIO SURGEON STRL SZ7.5 (GLOVE) ×4 IMPLANT
GOWN STRL REUS W/ TWL LRG LVL3 (GOWN DISPOSABLE) ×2 IMPLANT
GOWN STRL REUS W/TWL LRG LVL3 (GOWN DISPOSABLE) ×4
KIT BASIN OR (CUSTOM PROCEDURE TRAY) ×2 IMPLANT
KIT MARKER MARGIN INK (KITS) ×2 IMPLANT
LIGHT WAVEGUIDE WIDE FLAT (MISCELLANEOUS) IMPLANT
NEEDLE HYPO 25X1 1.5 SAFETY (NEEDLE) ×2 IMPLANT
NS IRRIG 1000ML POUR BTL (IV SOLUTION) ×2 IMPLANT
PACK SURGICAL SETUP 50X90 (CUSTOM PROCEDURE TRAY) ×2 IMPLANT
PENCIL BUTTON HOLSTER BLD 10FT (ELECTRODE) ×2 IMPLANT
SPONGE LAP 18X18 X RAY DECT (DISPOSABLE) ×2 IMPLANT
SUT MNCRL AB 4-0 PS2 18 (SUTURE) ×4 IMPLANT
SUT SILK 2 0 SH (SUTURE) IMPLANT
SUT VIC AB 3-0 SH 18 (SUTURE) ×2 IMPLANT
SYR BULB 3OZ (MISCELLANEOUS) ×2 IMPLANT
SYR CONTROL 10ML LL (SYRINGE) ×2 IMPLANT
TOWEL OR 17X24 6PK STRL BLUE (TOWEL DISPOSABLE) ×2 IMPLANT
TOWEL OR 17X26 10 PK STRL BLUE (TOWEL DISPOSABLE) ×2 IMPLANT
TUBE CONNECTING 12X1/4 (SUCTIONS) ×2 IMPLANT
YANKAUER SUCT BULB TIP NO VENT (SUCTIONS) ×2 IMPLANT

## 2017-05-03 NOTE — Anesthesia Procedure Notes (Signed)
Procedure Name: LMA Insertion Date/Time: 05/03/2017 3:43 PM Performed by: Valda Favia Pre-anesthesia Checklist: Patient identified, Emergency Drugs available, Suction available, Patient being monitored and Timeout performed Patient Re-evaluated:Patient Re-evaluated prior to inductionOxygen Delivery Method: Circle system utilized Preoxygenation: Pre-oxygenation with 100% oxygen Intubation Type: IV induction LMA: LMA inserted LMA Size: 4.0 Number of attempts: 1 Placement Confirmation: positive ETCO2 and breath sounds checked- equal and bilateral Tube secured with: Tape Dental Injury: Teeth and Oropharynx as per pre-operative assessment

## 2017-05-03 NOTE — H&P (Signed)
Elizabeth [name]  Location: [facility] Patient #: [id number] DOB: 1940-11-22 Undefined / Language: Cleophus Molt / Race: White Female   History of Present Illness The patient is a 76 year old female who presents with breast cancer. We are asked to see the patient in consultation by Dr. Isidore Moos to evaluate her for a new left breast cancer. The patient is a 76 year old white female who presents after a screening mammogram found a 52mm area of calcification in the lower left breast. This was biopsied and came back as ductal carcinoma in situ. It was High grade and ER and PR +. She also had some calcification in the right breast which was benign. She does not smoke and uses only an estrogen cream. She has no family history of breast cancer.   Past Surgical History Breast Biopsy  Bilateral.  Diagnostic Studies History  Colonoscopy  1-5 years ago Mammogram  within last year Pap Smear  1-5 years ago  Medication History Medications Reconciled  Social History  Alcohol use  Moderate alcohol use. Caffeine use  Tea. No drug use  Tobacco use  Former smoker.  Family History Arthritis  Father. Diabetes Mellitus  Mother.  Pregnancy / Birth History  Age at menarche  80 years. Age of menopause  51-55 Contraceptive History  Intrauterine device, Oral contraceptives. Gravida  2 Maternal age  96-25 Para  2  Other Problems  Arthritis  Back Pain  Hemorrhoids  Lump In Breast  Sleep Apnea  Transfusion history     Review of Systems  General Not Present- Appetite Loss, Chills, Fatigue, Fever, Night Sweats, Weight Gain and Weight Loss. Skin Not Present- Change in Wart/Mole, Dryness, Hives, Jaundice, New Lesions, Non-Healing Wounds, Rash and Ulcer. HEENT Present- Hearing Loss, Seasonal Allergies and Wears glasses/contact lenses. Not Present- Earache, Hoarseness, Nose Bleed, Oral Ulcers, Ringing in the Ears, Sinus Pain, Sore Throat, Visual Disturbances and Yellow  Eyes. Respiratory Not Present- Bloody sputum, Chronic Cough, Difficulty Breathing, Snoring and Wheezing. Breast Present- Breast Mass. Not Present- Breast Pain, Nipple Discharge and Skin Changes. Cardiovascular Not Present- Chest Pain, Difficulty Breathing Lying Down, Leg Cramps, Palpitations, Rapid Heart Rate, Shortness of Breath and Swelling of Extremities. Gastrointestinal Not Present- Abdominal Pain, Bloating, Bloody Stool, Change in Bowel Habits, Chronic diarrhea, Constipation, Difficulty Swallowing, Excessive gas, Gets full quickly at meals, Hemorrhoids, Indigestion, Nausea, Rectal Pain and Vomiting. Female Genitourinary Not Present- Frequency, Nocturia, Painful Urination, Pelvic Pain and Urgency. Musculoskeletal Present- Back Pain. Not Present- Joint Pain, Joint Stiffness, Muscle Pain, Muscle Weakness and Swelling of Extremities. Neurological Present- Numbness and Tingling. Not Present- Decreased Memory, Fainting, Headaches, Seizures, Tremor, Trouble walking and Weakness. Psychiatric Not Present- Anxiety, Bipolar, Change in Sleep Pattern, Depression, Fearful and Frequent crying. Endocrine Not Present- Cold Intolerance, Excessive Hunger, Hair Changes, Heat Intolerance, Hot flashes and New Diabetes. Hematology Not Present- Blood Thinners, Easy Bruising, Excessive bleeding, Gland problems, HIV and Persistent Infections.   Physical Exam General Mental Status-Alert. General Appearance-Consistent with stated age. Hydration-Well hydrated. Voice-Normal.  Head and Neck Head-normocephalic, atraumatic with no lesions or palpable masses. Trachea-midline. Thyroid Gland Characteristics - normal size and consistency.  Eye Eyeball - Bilateral-Extraocular movements intact. Sclera/Conjunctiva - Bilateral-No scleral icterus.  Chest and Lung Exam Chest and lung exam reveals -quiet, even and easy respiratory effort with no use of accessory muscles and on auscultation, normal breath  sounds, no adventitious sounds and normal vocal resonance. Inspection Chest Wall - Normal. Back - normal.  Breast Note: There is no palpable mass in  either breast. There is a small mobile palpable lymph node in the left axilla. No other palpable lymphadenopathy   Cardiovascular Cardiovascular examination reveals -normal heart sounds, regular rate and rhythm with no murmurs and normal pedal pulses bilaterally.  Abdomen Inspection Inspection of the abdomen reveals - No Hernias. Skin - Scar - no surgical scars. Palpation/Percussion Palpation and Percussion of the abdomen reveal - Soft, Non Tender, No Rebound tenderness, No Rigidity (guarding) and No hepatosplenomegaly. Auscultation Auscultation of the abdomen reveals - Bowel sounds normal.  Neurologic Neurologic evaluation reveals -alert and oriented x 3 with no impairment of recent or remote memory. Mental Status-Normal.  Musculoskeletal Normal Exam - Left-Upper Extremity Strength Normal and Lower Extremity Strength Normal. Normal Exam - Right-Upper Extremity Strength Normal and Lower Extremity Strength Normal.  Lymphatic Head & Neck  General Head & Neck Lymphatics: Bilateral - Description - Normal. Axillary  General Axillary Region: Bilateral - Description - Normal. Tenderness - Non Tender. Femoral & Inguinal  Generalized Femoral & Inguinal Lymphatics: Bilateral - Description - Normal. Tenderness - Non Tender.    Assessment & Plan  DUCTAL CARCINOMA IN SITU (DCIS) OF LEFT BREAST (D05.12) Impression: The patient appears to have a small area of dcis in the lower left breast. I have talked to her in detail about the different options for treatment and at this point she favors breast conservation. She wound not need a sentinel node mapping. I have discussed with her the risks and benefits of the surgery as well as some of the technical aspects and she understands and wishes to proceed. I will plan for a left breast  radioactive seed localized lumpectomy Current Plans Pt Education - Breast cancer: discussed with patient and provided information.

## 2017-05-03 NOTE — Op Note (Signed)
05/03/2017  4:24 PM  PATIENT:  Elizabeth [name]  76 y.o. female  PRE-OPERATIVE DIAGNOSIS:  LEFT BREAST DCIS  POST-OPERATIVE DIAGNOSIS:  LEFT BREAST DCIS  PROCEDURE:  Procedure(s): LEFT BREAST LUMPECTOMY WITH RADIOACTIVE SEED LOCALIZATION (Left)  SURGEON:  Surgeon(s) and Role:    Jovita Kussmaul, MD - Primary  PHYSICIAN ASSISTANT:   ASSISTANTS: none   ANESTHESIA:   local and general  EBL:  Total I/O In: 1000 [I.V.:1000] Out: -   BLOOD ADMINISTERED:none  DRAINS: none   LOCAL MEDICATIONS USED:  MARCAINE     SPECIMEN:  Source of Specimen:  left breast tissue with additional medial and lateral margins  DISPOSITION OF SPECIMEN:  PATHOLOGY  COUNTS:  YES  TOURNIQUET:  * No tourniquets in log *  DICTATION: .Dragon Dictation   After informed consent was obtained the patient was brought to the operating room and placed in the supine position on the operating room table. After adequate induction of general anesthesia the patient's left breast was prepped with ChloraPrep, allowed to dry, and draped in usual sterile manner. An appropriate timeout was performed. Previously an I-125 seed was placed in the lower aspect of the left breast marked a small area of ductal carcinoma in situ. The neoprobe was set to I-125 in the area of radioactivity was readily identified. A vertically oriented elliptical incision was made overlying the area of radioactivity with a 15 blade knife. The incision was carried through the skin and subcutaneous tissue sharply with electrocautery. The dissection was carried down to the inframammary fold. The dissection was carried around the area of radioactivity all the way down to the chest wall. Once the specimen was removed it was oriented with the appropriate paint colors. A specimen radiograph was obtained that showed the clip and seed to be near the center of the specimen. The specimen was then sent to pathology for further evaluation. Additional medial and  lateral margins were taken sharply with the electrocautery and marked appropriately. The wound was then irrigated with copious amounts of saline. The wound was infiltrated with more quarter percent Marcaine. The breast tissue was mobilized off the chest wall circumferentially around the incision. The deep layer of the wound was then closed with layers of interrupted 3-0 Vicryl stitches. The skin was then closed with interrupted 4-0 Monocryl subcuticular stitches. Dermabond dressings were applied. The patient tolerated the procedure well. At the end of the case all needle sponge and instrument counts were correct. The patient was then awakened and taken to recovery in stable condition.  PLAN OF CARE: Discharge to home after PACU  PATIENT DISPOSITION:  PACU - hemodynamically stable.   Delay start of Pharmacological VTE agent (>24hrs) due to surgical blood loss or risk of bleeding: not applicable

## 2017-05-03 NOTE — Anesthesia Preprocedure Evaluation (Signed)
Anesthesia Evaluation  Patient identified by MRN, date of birth, ID band Patient awake    Reviewed: Allergy & Precautions, NPO status , Patient's Chart, lab work & pertinent test results  Airway Mallampati: I  TM Distance: >3 FB Neck ROM: Full    Dental   Pulmonary sleep apnea , former smoker,    Pulmonary exam normal        Cardiovascular Normal cardiovascular exam     Neuro/Psych    GI/Hepatic   Endo/Other    Renal/GU      Musculoskeletal   Abdominal   Peds  Hematology   Anesthesia Other Findings   Reproductive/Obstetrics                             Anesthesia Physical Anesthesia Plan  ASA: II  Anesthesia Plan: General   Post-op Pain Management:    Induction: Intravenous  PONV Risk Score and Plan: 3 and Ondansetron, Dexamethasone, Propofol and Midazolam  Airway Management Planned: LMA  Additional Equipment:   Intra-op Plan:   Post-operative Plan: Extubation in OR  Informed Consent: I have reviewed the patients History and Physical, chart, labs and discussed the procedure including the risks, benefits and alternatives for the proposed anesthesia with the patient or authorized representative who has indicated his/her understanding and acceptance.     Plan Discussed with: CRNA and Surgeon  Anesthesia Plan Comments:         Anesthesia Quick Evaluation

## 2017-05-03 NOTE — Transfer of Care (Signed)
Immediate Anesthesia Transfer of Care Note  Patient: Elizabeth [name]  Procedure(s) Performed: Procedure(s): LEFT BREAST LUMPECTOMY WITH RADIOACTIVE SEED LOCALIZATION (Left)  Patient Location: PACU  Anesthesia Type:General  Level of Consciousness: awake, alert  and oriented  Airway & Oxygen Therapy: Patient Spontanous Breathing and Patient connected to nasal cannula oxygen  Post-op Assessment: Report given to RN and Post -op Vital signs reviewed and stable  Post vital signs: Reviewed and stable  Last Vitals:  Vitals:   05/03/17 1255 05/03/17 1637  BP: 119/72 127/84  Pulse: 84 81  Resp: 20 15  Temp: 36.6 C 36.1 C    Last Pain:  Vitals:   05/03/17 1637  TempSrc:   PainSc: 0-No pain         Complications: No apparent anesthesia complications

## 2017-05-03 NOTE — Interval H&P Note (Signed)
History and Physical Interval Note:  05/03/2017 3:02 PM  Elizabeth [name]  has presented today for surgery, with the diagnosis of LEFT BREAST DCIS  The various methods of treatment have been discussed with the patient and family. After consideration of risks, benefits and other options for treatment, the patient has consented to  Procedure(s): LEFT BREAST LUMPECTOMY WITH RADIOACTIVE SEED LOCALIZATION (Left) as a surgical intervention .  The patient's history has been reviewed, patient examined, no change in status, stable for surgery.  I have reviewed the patient's chart and labs.  Questions were answered to the patient's satisfaction.     TOTH III,PAUL S

## 2017-05-04 ENCOUNTER — Encounter (HOSPITAL_COMMUNITY): Payer: Self-pay | Admitting: General Surgery

## 2017-05-04 NOTE — Anesthesia Postprocedure Evaluation (Signed)
Anesthesia Post Note  Patient: ALBERT DEVAUL  Procedure(s) Performed: Procedure(s) (LRB): LEFT BREAST LUMPECTOMY WITH RADIOACTIVE SEED LOCALIZATION (Left)     Patient location during evaluation: PACU Anesthesia Type: General Level of consciousness: awake and alert Pain management: pain level controlled Vital Signs Assessment: post-procedure vital signs reviewed and stable Respiratory status: spontaneous breathing, nonlabored ventilation, respiratory function stable and patient connected to nasal cannula oxygen Cardiovascular status: blood pressure returned to baseline and stable Postop Assessment: no signs of nausea or vomiting Anesthetic complications: no    Last Vitals:  Vitals:   05/03/17 1651 05/03/17 1705  BP: 134/70 136/60  Pulse: 84 78  Resp: 16 14  Temp:  36.5 C    Last Pain:  Vitals:   05/03/17 1705  TempSrc:   PainSc: 0-No pain                 Harmonii Karle DAVID

## 2017-05-08 NOTE — Progress Notes (Signed)
Location of Breast Cancer: Left Breast  Histology per Pathology Report:  03/07/17 Diagnosis 1. Breast, left, needle core biopsy - ADIPOSE TISSUE - NO CARCINOMA IDENTIFIED - SEE COMMENT 2. Breast, right, needle core biopsy, upper outer quadrant - FIBROCYSTIC CHANGE WITH MICROCALCIFICATIONS - VASCULAR CALCIFICATIONS - NO CARCINOMA IDENTIFIED  03/26/17 Diagnosis 1. Breast, right, needle core biopsy, upper inner quad - CALCIFICATIONS ASSOCIATED WITH FIBROCYSTIC CHANGES AND ATROPHIC LOBULES. - NO ATYPIA, IN SITU OR INVASIVE MALIGNANCY IDENTIFIED. - SEE COMMENT. 2. Breast, left, needle core biopsy, lower outer quad - CALCIFICATIONS ASSOCIATED WITH HIGH GRADE DUCTAL CARCINOMA IN SITU AND ATROPHIC LOBULES, ADJACENT TO FLORID BIOPSY SITE REACTION. - NO INVASIVE CARCINOMA IDENTIFIED. - ER AND PR WILL BE ORDERED OF BLOCK 2A AND SEPARATELY REPORTED. - SEE COMMENT  Receptor Status: ER(95%), PR (95%)  05/03/17 Diagnosis 1. Breast, lumpectomy - DUCTAL CARCINOMA IN SITU, INTERMEDIATE NUCLEAR GRADE - PREVIOUS BIOPSY SITE CHANGES - SEE COMMENT AND ONCOLOGY TABLE BELOW 2. Breast, excision, Left additional lateral margin - RESIDUAL DUCTAL CARCINOMA IN SITU, INTERMEDIATE NUCLEAR GRADE - CARCINOMA LESS THAN 0.1 CM FROM INKED RESECTION MARGIN 3. Breast, excision, Left additional medial margin - BENIGN BREAST TISSUE - NO CARCINOMA IDENTIFIED  Did patient present with symptoms or was this found on screening mammography?: It was found on a screening mammogram.   Past/Anticipated interventions by surgeon, if any: 05/03/17 PROCEDURE:  Procedure(s): LEFT BREAST LUMPECTOMY WITH RADIOACTIVE SEED LOCALIZATION (Left) SURGEON:  Surgeon(s) and Role:    Jovita Kussmaul, MD - Primary  She has a post op appointment with Dr. Marlou Starks 7/13  Past/Anticipated interventions by medical oncology, if any:  04/04/17 Dr. Lindi Adie in the [facility].  Recommendation: 1. Breast conserving surgery (completed  05/03/17) 2. Followed by adjuvant radiation therapy 3. Followed by antiestrogen therapy with tamoxifen 5 years  Lymphedema issues, if any:  She denies. She has good arm mobility.   Pain issues, if any:  She has back spasms from spinal surgery in January.   SAFETY ISSUES:  Prior radiation? No  Pacemaker/ICD? No  Possible current pregnancy? No  Is the patient on methotrexate? No  Current Complaints / other details:   She is leaving for [facility] 05/14/17 and returning July 10th.  She also has a trip 06/09/17- 06/16/17. She would like to start radiation after 06/16/17.   BP 129/80   Pulse 82   Temp 98 F (36.7 C)   Ht 5' 6.5" (1.689 m)   Wt 141 lb (64 kg)   SpO2 100% Comment: room air  BMI 22.42 kg/m    Wt Readings from Last 3 Encounters:  05/11/17 141 lb (64 kg)  05/03/17 139 lb (63 kg)  04/30/17 139 lb 14.4 oz (63.5 kg)      Dalonte Hardage, Stephani Police, RN 05/08/2017,3:44 PM

## 2017-05-09 DIAGNOSIS — M5441 Lumbago with sciatica, right side: Secondary | ICD-10-CM | POA: Diagnosis not present

## 2017-05-11 ENCOUNTER — Ambulatory Visit
Admission: RE | Admit: 2017-05-11 | Discharge: 2017-05-11 | Disposition: A | Discharge status: Home / Self Care | Payer: Medicare HMO | Source: Ambulatory Visit | Attending: Radiation Oncology | Admitting: Radiation Oncology

## 2017-05-11 ENCOUNTER — Encounter: Payer: Self-pay | Admitting: Radiation Oncology

## 2017-05-11 DIAGNOSIS — Z170 Estrogen receptor positive status [ER+]: Secondary | ICD-10-CM | POA: Diagnosis not present

## 2017-05-11 DIAGNOSIS — D0512 Intraductal carcinoma in situ of left breast: Secondary | ICD-10-CM | POA: Diagnosis not present

## 2017-05-11 DIAGNOSIS — Z87891 Personal history of nicotine dependence: Secondary | ICD-10-CM | POA: Diagnosis not present

## 2017-05-11 DIAGNOSIS — Z510 Encounter for antineoplastic radiation therapy: Secondary | ICD-10-CM | POA: Diagnosis not present

## 2017-05-11 DIAGNOSIS — C50512 Malignant neoplasm of lower-outer quadrant of left female breast: Secondary | ICD-10-CM | POA: Diagnosis not present

## 2017-05-11 DIAGNOSIS — Z79899 Other long term (current) drug therapy: Secondary | ICD-10-CM | POA: Diagnosis not present

## 2017-05-11 DIAGNOSIS — Z9889 Other specified postprocedural states: Secondary | ICD-10-CM | POA: Diagnosis not present

## 2017-05-11 NOTE — Progress Notes (Signed)
Radiation Oncology         [phone] ________________________________  Name: Elizabeth [name] MRN: [id number]  Date: 05/11/2017  DOB: 21-May-1941  Follow-Up Visit  Note  Outpatient  CC: Midge Minium, MD  Nicholas Lose, MD  Diagnosis:      ICD-10-CM   1. Malignant neoplasm of lower-outer quadrant of left breast of female, estrogen receptor positive ([place]) C50.512    Z17.0    Stage 0 pTis pNx Left Breast DCIS, ER/PR Positive, Grade 2  CHIEF COMPLAINT: Here to discuss management of Left breast cancer  Narrative:  The patient returns today for follow-up.  She was initially seen in consultation during the multidisciplinary breast clinic on 04/04/17.   Since consultation, she underwent left breast lumpectomy with Dr. Marlou Starks on 05/03/17. Surgical pathology confirmed DCIS, intermediate nuclear grade, with carcinoma less than 0.1 cm from inked resection margin.  When the patient saw Dr. Lindi Adie in consult in May she determined that she did not want to take antiestrogen therapy.  The patient presents to the clinic today to discuss the role radiation therapy may play in the treatment of her disease.  On review of systems, the patient denies lymphedema concerns. She reports back spasms from a spinal surgery in January. She has concerns about using estrogen cream in light of her diagnosis, though she expresses occasional vaginal discomfort without this.  She has a post operative follow up with Dr. Marlou Starks scheduled for 06/01/17.  The patient is leaving for [place] on 6/25 and will return 7/10. She also has a trip scheduled from 7/21 - 7/28. She would like to start radiation after this.            ALLERGIES:  is allergic to no known allergies.  Meds: Current Outpatient Prescriptions  Medication Sig Dispense Refill  . acetaminophen (TYLENOL) 500 MG tablet Take 1,000 mg by mouth 3 (three) times daily as needed for moderate pain.    . Biotin 2.5 MG CAPS Take 2.5 mg by mouth daily.     .  Carboxymethylcellul-Glycerin (LUBRICATING EYE DROPS OP) Apply 1 drop to eye daily as needed (dry eyes).    . Cholecalciferol (VITAMIN D) 2000 UNITS CAPS Take 2,000 Units by mouth daily.     . diazepam (VALIUM) 5 MG tablet Take 5 mg by mouth 2 (two) times daily as needed for muscle spasms.     Marland Kitchen estradiol (ESTRACE) 0.1 MG/GM vaginal cream Place 0.5 g vaginally 2 (two) times a week.     . fexofenadine (ALLEGRA) 180 MG tablet Take 180 mg by mouth daily.    . fluticasone (FLONASE) 50 MCG/ACT nasal spray Place 1 spray into both nostrils at bedtime.     Marland Kitchen GLUCOSAMINE-CHONDROITIN PO Take 3 tablets by mouth daily.    . Lidocaine 4 % PTCH Apply 2 patches topically daily as needed (pain).    . Methylcellulose, Laxative, (CITRUCEL PO) Take 2 tablets by mouth daily.    . Nutritional Supplements (JUICE PLUS FIBRE PO) Take by mouth. Juice plus fruit 4 tablets daily Juice plus veggie 4 tablets daily    . Probiotic Product (ADVANCED PROBIOTIC 10) CAPS Take 1 capsule by mouth daily.    . traMADol (ULTRAM) 50 MG tablet Take 50 mg by mouth 2 (two) times daily as needed for moderate pain.     . Turmeric 500 MG TABS Take 1,000 mg by mouth daily.    Marland Kitchen HYDROcodone-acetaminophen (NORCO/VICODIN) 5-325 MG tablet Take 1-2 tablets by mouth every 4 (four) hours  as needed for moderate pain or severe pain. (Patient not taking: Reported on 05/11/2017) 15 tablet 0   No current facility-administered medications for this encounter.    REVIEW OF SYSTEMS: As above  Physical Findings:  height is 5' 6.5" (1.689 m) and weight is 141 lb (64 kg). Her temperature is 98 F (36.7 C). Her blood pressure is 129/80 and her pulse is 82. Her oxygen saturation is 100%.  General: Alert and oriented, in no acute distress. HEENT: Head is normocephalic. Oropharynx and oral cavity are clear. Neck: Neck is supple, no palpable cervical or supraclavicular lymphadenopathy or masses. Heart: Regular in rate and rhythm with no murmurs. Chest: Clear to  auscultation bilaterally. Extremities: No edema. Lymphatics: see Neck Exam Neurologic: No obvious focalities. Speech is fluent.  Psychiatric: Judgment and insight are intact. Affect is appropriate. Breast exam reveals a vertical incision at the 6 o'clock region of the left breast which is healing very well. No palpable masses in bilateral breasts or axillae.  Lab Findings: Lab Results  Component Value Date   WBC 5.3 04/30/2017   HGB 11.5 (L) 04/30/2017   HCT 34.6 (L) 04/30/2017   MCV 93.0 04/30/2017   PLT 375 04/30/2017   Radiographic Findings: Mm Breast Surgical Specimen  Result Date: 05/03/2017 CLINICAL DATA:  Evaluate surgical specimen with radioactive seed following left lumpectomy for DCIS. EXAM: SPECIMEN RADIOGRAPH OF THE LEFT BREAST COMPARISON:  Previous exam(s). FINDINGS: Status post excision of the left breast. The radioactive seed and biopsy marker clip are present, completely intact, and were marked for pathology. IMPRESSION: Specimen radiograph of the left breast. Electronically Signed   By: Margarette Canada M.D.   On: 05/03/2017 16:06   Mm Lt Radioactive Seed Loc Mammo Guide  Result Date: 05/02/2017 CLINICAL DATA:  76 year old female with recently diagnosed left breast DCIS. EXAM: MAMMOGRAPHIC GUIDED RADIOACTIVE SEED LOCALIZATION OF THE LEFT BREAST COMPARISON:  Previous exam(s). FINDINGS: Patient presents for radioactive seed localization prior to left breast lumpectomy. I met with the patient and we discussed the procedure of seed localization including benefits and alternatives. We discussed the high likelihood of a successful procedure. We discussed the risks of the procedure including infection, bleeding, tissue injury and further surgery. We discussed the low dose of radioactivity involved in the procedure. Informed, written consent was given. The usual time-out protocol was performed immediately prior to the procedure. Using mammographic guidance, sterile technique, 1% lidocaine  and an I-125 radioactive seed, the X shaped biopsy marking clip with adjacent calcifications were localized using a lateral to medial approach. The follow-up mammogram images confirm the seed in the expected location and were marked for Dr. Marlou Starks. Follow-up survey of the patient confirms presence of the radioactive seed. Order number of I-125 seed:  [id number]. Total activity:  2.703 millicuries  Reference Date: 04/26/2017 The patient tolerated the procedure well and was released from the [facility]. She was given instructions regarding seed removal. IMPRESSION: Radioactive seed localization left breast. No apparent complications. Electronically Signed   By: Everlean Alstrom M.D.   On: 05/02/2017 15:14    Impression/Plan: We discussed adjuvant radiotherapy today. I recommend radiation the to left breast in order to reduce the risk of locoregional recurrence by 1/2. The risks, benefits and side effects of this treatment were discussed in detail.  She understands that radiotherapy is associated with skin irritation and fatigue in the acute setting. Late effects can include cosmetic changes and rare injury to internal organs. She is enthusiastic about proceeding with treatment. A  consent form has been discussed, signed, and placed in her chart.  We will plan to simulate her upon her return from her summer vacations on Monday 7/30 at 1pm, with plans to begin treatment soon thereafter.  I will confirm with Radiology the potential benefit of post operative mammogram in July before beginning radiation therapy.  I will refer the patient to PT for pelvic floor therapy in the setting of postmenopausal symptoms that affect sexual function. Instructed her to stop estrogen cream in light of her diagnosis. She may also discuss this with her gynecologist for further recommendations.  A total of 3 medically necessary complex treatment devices will be fabricated and supervised by me: 2 fields with MLCs for custom blocks  to protect heart, and lungs;  and, a Vac-lok. MORE COMPLEX DEVICES MAY BE MADE IN DOSIMETRY FOR FIELD IN FIELD BEAMS FOR DOSE HOMOGENEITY.  I have requested : 3D Simulation which is medically necessary to give adequate dose to at risk tissues while sparing lungs and heart.  I have requested a DVH of the following structures: lungs, heart, left lumpectomy cavity.    The patient will receive 40.05 Gy in 15 fractions to the left breast with 2 fields.  This will be followed by a boost.  I spent 25 minutes face to face with the patient and more than 50% of that time was spent in counseling and/or coordination of care. _____________________________________   Eppie Gibson, MD  This document serves as a record of services personally performed by Eppie Gibson, MD. It was created on her behalf by Maryla Morrow, a trained medical scribe. The creation of this record is based on the scribe's personal observations and the provider's statements to them. This document has been checked and approved by the attending provider.

## 2017-05-13 ENCOUNTER — Other Ambulatory Visit: Payer: Self-pay | Admitting: Radiation Oncology

## 2017-05-13 DIAGNOSIS — Z170 Estrogen receptor positive status [ER+]: Principal | ICD-10-CM

## 2017-05-13 DIAGNOSIS — C50512 Malignant neoplasm of lower-outer quadrant of left female breast: Secondary | ICD-10-CM

## 2017-05-15 ENCOUNTER — Ambulatory Visit: Payer: Medicare HMO | Admitting: Hematology and Oncology

## 2017-05-29 ENCOUNTER — Other Ambulatory Visit: Payer: Self-pay | Admitting: Radiation Oncology

## 2017-05-29 ENCOUNTER — Telehealth: Payer: Self-pay | Admitting: *Deleted

## 2017-05-29 DIAGNOSIS — Z170 Estrogen receptor positive status [ER+]: Principal | ICD-10-CM

## 2017-05-29 DIAGNOSIS — C50512 Malignant neoplasm of lower-outer quadrant of left female breast: Secondary | ICD-10-CM

## 2017-05-29 NOTE — Progress Notes (Signed)
Malignant neoplasm of lower-outer quadrant of left breast of female, estrogen receptor positive ([place]) - Plan: MM Digital Diagnostic Unilat L   Mammogram ordered after my discussion w/ Dr Lovey Newcomer re: this patient. This study will serve to rule out residual calcifications after lumpectomy for DCIS. I asked scheduling to arrange this on July 16, 17, or 18 given upcoming vacation for patient.  -----------------------------------  Eppie Gibson, MD

## 2017-05-29 NOTE — Telephone Encounter (Signed)
CALLED PATIENT TO INFORM OF MAMMOGRAM FOR 06-04-17 - ARRIVAL TIME - 12:40 PM @ [facility], LVM FOR A RETURN CALL

## 2017-06-04 DIAGNOSIS — M549 Dorsalgia, unspecified: Secondary | ICD-10-CM | POA: Diagnosis not present

## 2017-06-04 DIAGNOSIS — M4325 Fusion of spine, thoracolumbar region: Secondary | ICD-10-CM | POA: Diagnosis not present

## 2017-06-05 ENCOUNTER — Ambulatory Visit: Payer: Medicare HMO | Admitting: Hematology and Oncology

## 2017-06-05 DIAGNOSIS — H52223 Regular astigmatism, bilateral: Secondary | ICD-10-CM | POA: Diagnosis not present

## 2017-06-05 DIAGNOSIS — H5213 Myopia, bilateral: Secondary | ICD-10-CM | POA: Diagnosis not present

## 2017-06-06 ENCOUNTER — Ambulatory Visit
Admission: RE | Admit: 2017-06-06 | Discharge: 2017-06-06 | Disposition: A | Discharge status: Home / Self Care | Payer: Medicare HMO | Source: Ambulatory Visit | Attending: Radiation Oncology | Admitting: Radiation Oncology

## 2017-06-06 ENCOUNTER — Other Ambulatory Visit: Payer: Self-pay | Admitting: Radiation Oncology

## 2017-06-06 DIAGNOSIS — Z170 Estrogen receptor positive status [ER+]: Principal | ICD-10-CM

## 2017-06-06 DIAGNOSIS — C50512 Malignant neoplasm of lower-outer quadrant of left female breast: Secondary | ICD-10-CM

## 2017-06-06 DIAGNOSIS — R922 Inconclusive mammogram: Secondary | ICD-10-CM | POA: Diagnosis not present

## 2017-06-18 ENCOUNTER — Ambulatory Visit
Admission: RE | Admit: 2017-06-18 | Discharge: 2017-06-18 | Disposition: A | Discharge status: Home / Self Care | Payer: Medicare HMO | Source: Ambulatory Visit | Attending: Radiation Oncology | Admitting: Radiation Oncology

## 2017-06-18 DIAGNOSIS — Z170 Estrogen receptor positive status [ER+]: Principal | ICD-10-CM

## 2017-06-18 DIAGNOSIS — C50512 Malignant neoplasm of lower-outer quadrant of left female breast: Secondary | ICD-10-CM

## 2017-06-18 DIAGNOSIS — Z510 Encounter for antineoplastic radiation therapy: Secondary | ICD-10-CM | POA: Diagnosis not present

## 2017-06-18 DIAGNOSIS — Z79899 Other long term (current) drug therapy: Secondary | ICD-10-CM | POA: Diagnosis not present

## 2017-06-18 DIAGNOSIS — Z87891 Personal history of nicotine dependence: Secondary | ICD-10-CM | POA: Diagnosis not present

## 2017-06-19 NOTE — Progress Notes (Signed)
Radiation Oncology         [phone] ________________________________  Name: Elizabeth [name] MRN: [id number]  Date: 06/18/2017  DOB: 09/14/1941  SIMULATION AND TREATMENT PLANNING NOTE / Special treatment procedure Outpatient  DIAGNOSIS:     ICD-10-CM   1. Malignant neoplasm of lower-outer quadrant of left breast of female, estrogen receptor positive ([place]) C50.512    Z17.0     NARRATIVE:  The patient was brought to the [facility].  Identity was confirmed.  All relevant records and images related to the planned course of therapy were reviewed including mammography report from this month.  The patient freely provided informed written consent to proceed with treatment after reviewing the details related to the planned course of therapy. The consent form was witnessed and verified by the simulation staff.    Then, the patient was set-up in a stable reproducible supine position for radiation therapy with her ipsilateral arm over her head, and her upper body secured in a custom-made Vac-lok device.  CT images were obtained.  Surface markings were placed.  The CT images were loaded into the planning software.    Special treatment procedure:  Special treatment procedure was performed today due to the extra time and effort required by myself to plan and prepare this patient for deep inspiration breath hold technique.  I have determined cardiac sparing to be of benefit to this patient to prevent long term cardiac damage due to radiation of the heart.  Bellows were placed on the patient's abdomen. To facilitate cardiac sparing, the patient was coached by the radiation therapists on breath hold techniques and breathing practice was performed. Practice waveforms were obtained. The patient was then scanned while maintaining breath hold in the treatment position.  This image was then transferred over to the imaging specialist. The imaging specialist then created a fusion of the free  breathing and breath hold scans using the chest wall as the stable structure. I personally reviewed the fusion in axial, coronal and sagittal image planes.  Excellent cardiac sparing was obtained.  I felt the patient is an appropriate candidate for breath hold and the patient will be treated as such.  The image fusion was then reviewed with the patient to reinforce the necessity of reproducible breath hold.  TREATMENT PLANNING NOTE: Treatment planning then occurred.  The radiation prescription was entered and confirmed.     A total of 3 medically necessary complex treatment devices were fabricated and supervised by me: 2 fields with MLCs for custom blocks to protect heart, and lungs;  and, a Vac-lok. MORE COMPLEX DEVICES MAY BE MADE IN DOSIMETRY FOR FIELD IN FIELD BEAMS FOR DOSE HOMOGENEITY.  I have requested : 3D Simulation which is medically necessary to give adequate dose to at risk tissues while sparing lungs and heart.  I have requested a DVH of the following structures: lungs, heart, lumpectomy cavity  (left).    The patient will receive 40.05 Gy in 15 fractions to the left breast with 2 tangential fields.  This will be followed by a boost.  Optical Surface Tracking Plan:  Since intensity modulated radiotherapy (IMRT) and 3D conformal radiation treatment methods are predicated on accurate and precise positioning for treatment, intrafraction motion monitoring is medically necessary to ensure accurate and safe treatment delivery. The ability to quantify intrafraction motion without excessive ionizing radiation dose can only be performed with optical surface tracking. Accordingly, surface imaging offers the opportunity to obtain 3D measurements of patient position throughout IMRT  and 3D treatments without excessive radiation exposure. I am ordering optical surface tracking for this patient's upcoming course of radiotherapy.  ________________________________   Reference:  Ursula Alert, J, et al. Surface imaging-based analysis of intrafraction motion for breast radiotherapy patients.Journal of [place], n. 6, nov. 2014. ISSN [id number].  Available at: <http://www.jacmp.org/index.php/jacmp/article/view/4957>.    -----------------------------------  Eppie Gibson, MD

## 2017-06-20 DIAGNOSIS — Z87891 Personal history of nicotine dependence: Secondary | ICD-10-CM | POA: Diagnosis not present

## 2017-06-20 DIAGNOSIS — Z510 Encounter for antineoplastic radiation therapy: Secondary | ICD-10-CM | POA: Diagnosis not present

## 2017-06-20 DIAGNOSIS — Z170 Estrogen receptor positive status [ER+]: Secondary | ICD-10-CM | POA: Diagnosis not present

## 2017-06-20 DIAGNOSIS — C50512 Malignant neoplasm of lower-outer quadrant of left female breast: Secondary | ICD-10-CM | POA: Diagnosis not present

## 2017-06-20 DIAGNOSIS — Z79899 Other long term (current) drug therapy: Secondary | ICD-10-CM | POA: Diagnosis not present

## 2017-06-21 ENCOUNTER — Telehealth: Payer: Self-pay | Admitting: Hematology and Oncology

## 2017-06-21 NOTE — Telephone Encounter (Signed)
sw pt to confirm 8/30 appt at 0915 per sch msg

## 2017-06-24 ENCOUNTER — Encounter (HOSPITAL_COMMUNITY): Payer: Self-pay | Admitting: Emergency Medicine

## 2017-06-24 ENCOUNTER — Ambulatory Visit (HOSPITAL_COMMUNITY)
Admission: EM | Admit: 2017-06-24 | Discharge: 2017-06-24 | Disposition: A | Discharge status: Home / Self Care | Payer: Medicare HMO | Attending: Internal Medicine | Admitting: Internal Medicine

## 2017-06-24 DIAGNOSIS — Z1629 Resistance to other single specified antibiotic: Secondary | ICD-10-CM | POA: Diagnosis not present

## 2017-06-24 DIAGNOSIS — B9620 Unspecified Escherichia coli [E. coli] as the cause of diseases classified elsewhere: Secondary | ICD-10-CM | POA: Insufficient documentation

## 2017-06-24 DIAGNOSIS — N3090 Cystitis, unspecified without hematuria: Secondary | ICD-10-CM | POA: Diagnosis not present

## 2017-06-24 DIAGNOSIS — R300 Dysuria: Secondary | ICD-10-CM | POA: Diagnosis present

## 2017-06-24 DIAGNOSIS — Z1611 Resistance to penicillins: Secondary | ICD-10-CM | POA: Diagnosis not present

## 2017-06-24 DIAGNOSIS — R350 Frequency of micturition: Secondary | ICD-10-CM | POA: Diagnosis present

## 2017-06-24 LAB — POCT URINALYSIS DIP (DEVICE)
Bilirubin Urine: NEGATIVE
GLUCOSE, UA: NEGATIVE mg/dL
Ketones, ur: NEGATIVE mg/dL
Nitrite: NEGATIVE
Protein, ur: NEGATIVE mg/dL
UROBILINOGEN UA: 0.2 mg/dL (ref 0.0–1.0)
pH: 6 (ref 5.0–8.0)

## 2017-06-24 MED ORDER — SULFAMETHOXAZOLE-TRIMETHOPRIM 800-160 MG PO TABS: 1.0000 | ORAL_TABLET | Freq: Two times a day (BID) | ORAL | 0 refills | Status: DC

## 2017-06-24 MED ORDER — FLUCONAZOLE 150 MG PO TABS
150.0000 mg | ORAL_TABLET | Freq: Every day | ORAL | 0 refills | Status: DC
Start: 2017-06-24 — End: 2017-08-24

## 2017-06-24 NOTE — Discharge Instructions (Signed)
Take Bactrim as directed for UTI. Diflucan for yeast infection should you need it. Contact your oncologist and update her on current diagnosis. Monitor for worsening of symptoms, abdominal pain, nausea/vomiting, blood in stool to go to the ED for further evaluation.

## 2017-06-24 NOTE — ED Provider Notes (Signed)
CSN: [id number]     Arrival date & time 06/24/17  1205 History   None    Chief Complaint  Patient presents with  . Urinary Frequency  . Dysuria   (Consider location/radiation/quality/duration/timing/severity/associated sxs/prior Treatment) [age] year old female with history of arthritis, breast cancer, shingles, comes in for 1 day history of urinary urgency and frequency. She states she also noticed cloudy urine, no blood. Denies abdominal pain, nausea, vomiting. Has had a few episodes of loose stools. Denies fever, chills, night sweats. Denies weakness, dizziness, syncope. She starts radiation therapy for breast cancer tomorrow. She has back pain, from recent spinal fusion, which she takes Tylenol, Valium, tramadol.      Past Medical History:  Diagnosis Date  . Arthritis   . Cancer ([place])    pre cancer   . Chicken pox   . Shingles 07-2002  . Sleep apnea    mild uses oral devise  . Urinary tract infection    Past Surgical History:  Procedure Laterality Date  . BREAST LUMPECTOMY WITH RADIOACTIVE SEED LOCALIZATION Left 05/03/2017   Procedure: LEFT BREAST LUMPECTOMY WITH RADIOACTIVE SEED LOCALIZATION;  Surgeon: Jovita Kussmaul, MD;  Location: [facility];  Service: General;  Laterality: Left;  . COLONOSCOPY    . HYSTEROSCOPY     Dr Ubaldo Glassing  . SPINAL FUSION  12/01/2016   T10 to Pelvis  . WISDOM TOOTH EXTRACTION     age 30's   Family History  Problem Relation Age of Onset  . Diabetes Mother   . Arthritis Father   . Cancer Maternal Aunt        liver  . Heart disease Paternal Grandfather   . Colon cancer Neg Hx   . Pancreatic cancer Neg Hx   . Stomach cancer Neg Hx    Social History  Substance Use Topics  . Smoking status: Former Smoker    Types: Cigarettes    Quit date: 02/18/1981  . Smokeless tobacco: Never Used  . Alcohol use Yes     Comment: 1 beer or wine per day   OB History    No data available     Review of Systems  Reason unable to perform ROS: See HPI as above.     Allergies  No known allergies  Home Medications   Prior to Admission medications   Medication Sig Start Date End Date Taking? Authorizing Provider  acetaminophen (TYLENOL) 500 MG tablet Take 1,000 mg by mouth 3 (three) times daily as needed for moderate pain.    [provider]  Biotin 2.5 MG CAPS Take 2.5 mg by mouth daily.     [provider]  Carboxymethylcellul-Glycerin (LUBRICATING EYE DROPS OP) Apply 1 drop to eye daily as needed (dry eyes).    [provider]  Cholecalciferol (VITAMIN D) 2000 UNITS CAPS Take 2,000 Units by mouth daily.     [provider]  diazepam (VALIUM) 5 MG tablet Take 5 mg by mouth 2 (two) times daily as needed for muscle spasms.  03/05/17   [provider]  estradiol (ESTRACE) 0.1 MG/GM vaginal cream Place 0.5 g vaginally 2 (two) times a week.     [provider]  fexofenadine (ALLEGRA) 180 MG tablet Take 180 mg by mouth daily.    [provider]  fluconazole (DIFLUCAN) 150 MG tablet Take 1 tablet (150 mg total) by mouth daily. Can take second pill 72 hours later if symptoms do not resolve 06/24/17   Tasia Catchings, Amy V, PA-C  fluticasone (FLONASE) 50  MCG/ACT nasal spray Place 1 spray into both nostrils at bedtime.     [provider]  GLUCOSAMINE-CHONDROITIN PO Take 3 tablets by mouth daily.    [provider]  Lidocaine 4 % PTCH Apply 2 patches topically daily as needed (pain).    [provider]  Methylcellulose, Laxative, (CITRUCEL PO) Take 2 tablets by mouth daily.    [provider]  Nutritional Supplements (JUICE PLUS FIBRE PO) Take by mouth. Juice plus fruit 4 tablets daily Juice plus veggie 4 tablets daily    [provider]  Probiotic Product (ADVANCED PROBIOTIC 10) CAPS Take 1 capsule by mouth daily.    [provider]  sulfamethoxazole-trimethoprim (BACTRIM DS,SEPTRA DS) 800-160 MG tablet Take 1 tablet by mouth 2 (two) times daily. 06/24/17  06/27/17  Ok Edwards, PA-C  traMADol (ULTRAM) 50 MG tablet Take 50 mg by mouth 2 (two) times daily as needed for moderate pain.  08/24/16   [provider]  Turmeric 500 MG TABS Take 1,000 mg by mouth daily.    [provider]   Meds Ordered and Administered this Visit  Medications - No data to display  BP (!) 158/88 (BP Location: Left Arm) Comment: reported elevated BP to Amy Yu  Pulse 86   Temp 98.7 F (37.1 C) (Oral)   Resp 16   SpO2 98%  No data found.   Physical Exam  Constitutional: She is oriented to person, place, and time. She appears well-developed and well-nourished. No distress.  HENT:  Head: Normocephalic and atraumatic.  Eyes: Pupils are equal, round, and reactive to light. Conjunctivae are normal.  Cardiovascular: Normal rate, regular rhythm and normal heart sounds.  Exam reveals no gallop and no friction rub.   No murmur heard. Pulmonary/Chest: Effort normal and breath sounds normal. She has no wheezes. She has no rales.  Abdominal: Soft. Bowel sounds are normal. She exhibits no mass. There is no tenderness. There is no rebound, no guarding and no CVA tenderness.  Neurological: She is alert and oriented to person, place, and time.  Skin: Skin is warm and dry.  Psychiatric: She has a normal mood and affect. Her behavior is normal. Judgment normal.    Urgent Care Course     Procedures (including critical care time)  Labs Review Labs Reviewed  POCT URINALYSIS DIP (DEVICE) - Abnormal; Notable for the following:       Result Value   Hgb urine dipstick MODERATE (*)    Leukocytes, UA SMALL (*)    All other components within normal limits    Imaging Review No results found.     MDM   1. Cystitis    Discussed lab results with patient, a urine dipstick consistent with UTI. Start Bactrim as directed. Patient with history of yeast infection on antibiotic, Diflucan prescription given. Patient to update oncologist, so oncologist is aware of  current treatments prior to radiation therapy. Patient to monitor for worsening of symptoms, abdominal pain, blood in stool, nausea, vomiting, to go to the ED for further evaluation.   Ok Edwards, PA-C 06/24/17 1331

## 2017-06-24 NOTE — ED Triage Notes (Signed)
The patient presented to the [facility] with a complaint of urinary frequency and dysuria that started this morning.

## 2017-06-25 ENCOUNTER — Ambulatory Visit
Admission: RE | Admit: 2017-06-25 | Discharge: 2017-06-25 | Disposition: A | Discharge status: Home / Self Care | Payer: Medicare HMO | Source: Ambulatory Visit | Attending: Radiation Oncology | Admitting: Radiation Oncology

## 2017-06-25 DIAGNOSIS — Z170 Estrogen receptor positive status [ER+]: Secondary | ICD-10-CM | POA: Diagnosis not present

## 2017-06-25 DIAGNOSIS — C50512 Malignant neoplasm of lower-outer quadrant of left female breast: Secondary | ICD-10-CM | POA: Diagnosis not present

## 2017-06-25 DIAGNOSIS — Z510 Encounter for antineoplastic radiation therapy: Secondary | ICD-10-CM | POA: Diagnosis not present

## 2017-06-25 DIAGNOSIS — Z79899 Other long term (current) drug therapy: Secondary | ICD-10-CM | POA: Diagnosis not present

## 2017-06-25 DIAGNOSIS — Z87891 Personal history of nicotine dependence: Secondary | ICD-10-CM | POA: Diagnosis not present

## 2017-06-25 DIAGNOSIS — R69 Illness, unspecified: Secondary | ICD-10-CM | POA: Diagnosis not present

## 2017-06-26 ENCOUNTER — Telehealth (HOSPITAL_COMMUNITY): Payer: Self-pay | Admitting: Internal Medicine

## 2017-06-26 ENCOUNTER — Ambulatory Visit
Admission: RE | Admit: 2017-06-26 | Discharge: 2017-06-26 | Disposition: A | Discharge status: Home / Self Care | Payer: Medicare HMO | Source: Ambulatory Visit | Attending: Radiation Oncology | Admitting: Radiation Oncology

## 2017-06-26 DIAGNOSIS — C50512 Malignant neoplasm of lower-outer quadrant of left female breast: Secondary | ICD-10-CM | POA: Diagnosis not present

## 2017-06-26 DIAGNOSIS — Z170 Estrogen receptor positive status [ER+]: Secondary | ICD-10-CM | POA: Diagnosis not present

## 2017-06-26 DIAGNOSIS — Z79899 Other long term (current) drug therapy: Secondary | ICD-10-CM | POA: Diagnosis not present

## 2017-06-26 DIAGNOSIS — Z87891 Personal history of nicotine dependence: Secondary | ICD-10-CM | POA: Diagnosis not present

## 2017-06-26 DIAGNOSIS — Z510 Encounter for antineoplastic radiation therapy: Secondary | ICD-10-CM | POA: Diagnosis not present

## 2017-06-26 LAB — URINE CULTURE

## 2017-06-26 MED ORDER — CEPHALEXIN 500 MG PO CAPS: 500.0000 mg | ORAL_CAPSULE | Freq: Two times a day (BID) | ORAL | 0 refills | Status: AC

## 2017-06-26 NOTE — Telephone Encounter (Signed)
Clinical staff, please let patient know that urine culture is positive for E coli germ, resistant to trimethoprim/sulfa prescription given at the urgent care visit 8/5 but sensitive to cephalexin.  Stop trimethoprim/sulfa.  Prescription for cephalexin sent to the pharmacy of record, [facility] [address] at [facility] [place].  Recheck or followup with primary care provider for further evaluation if symptoms are not improving.  Jodi Mourning MD

## 2017-06-27 ENCOUNTER — Ambulatory Visit
Admission: RE | Admit: 2017-06-27 | Discharge: 2017-06-27 | Disposition: A | Discharge status: Home / Self Care | Payer: Medicare HMO | Source: Ambulatory Visit | Attending: Radiation Oncology | Admitting: Radiation Oncology

## 2017-06-27 ENCOUNTER — Telehealth: Payer: Self-pay | Admitting: Family Medicine

## 2017-06-27 DIAGNOSIS — Z170 Estrogen receptor positive status [ER+]: Secondary | ICD-10-CM | POA: Diagnosis not present

## 2017-06-27 DIAGNOSIS — Z87891 Personal history of nicotine dependence: Secondary | ICD-10-CM | POA: Diagnosis not present

## 2017-06-27 DIAGNOSIS — Z79899 Other long term (current) drug therapy: Secondary | ICD-10-CM | POA: Diagnosis not present

## 2017-06-27 DIAGNOSIS — C50512 Malignant neoplasm of lower-outer quadrant of left female breast: Secondary | ICD-10-CM | POA: Diagnosis not present

## 2017-06-27 DIAGNOSIS — Z510 Encounter for antineoplastic radiation therapy: Secondary | ICD-10-CM | POA: Diagnosis not present

## 2017-06-27 NOTE — Telephone Encounter (Signed)
Patient lvm stating that she isn't feeling better and that she needs something else to be called in or she was wondering if she needs to come in and give a urine sample. [facility] uses [facility] [address] at [employer]. Please advise.

## 2017-06-27 NOTE — Telephone Encounter (Signed)
Called pt and advised that per PCp, she will not feel results of new abx (sent in at 5:06 last night by Dr. Valere Dross office) for at least 3 days. Pt expressed an understanding.

## 2017-06-28 ENCOUNTER — Ambulatory Visit
Admission: RE | Admit: 2017-06-28 | Discharge: 2017-06-28 | Disposition: A | Discharge status: Home / Self Care | Payer: Medicare HMO | Source: Ambulatory Visit | Attending: Radiation Oncology | Admitting: Radiation Oncology

## 2017-06-28 DIAGNOSIS — Z170 Estrogen receptor positive status [ER+]: Secondary | ICD-10-CM | POA: Diagnosis not present

## 2017-06-28 DIAGNOSIS — Z79899 Other long term (current) drug therapy: Secondary | ICD-10-CM | POA: Diagnosis not present

## 2017-06-28 DIAGNOSIS — Z510 Encounter for antineoplastic radiation therapy: Secondary | ICD-10-CM | POA: Diagnosis not present

## 2017-06-28 DIAGNOSIS — Z87891 Personal history of nicotine dependence: Secondary | ICD-10-CM | POA: Diagnosis not present

## 2017-06-28 DIAGNOSIS — C50512 Malignant neoplasm of lower-outer quadrant of left female breast: Secondary | ICD-10-CM

## 2017-06-28 MED ORDER — RADIAPLEXRX EX GEL
Freq: Once | CUTANEOUS | Status: AC
  Administered 2017-06-28: 14:00:00 via TOPICAL

## 2017-06-28 MED ORDER — ALRA NON-METALLIC DEODORANT (RAD-ONC)
1.0000 "application " | Freq: Once | TOPICAL | Status: AC
  Administered 2017-06-28: 1 via TOPICAL

## 2017-06-28 NOTE — Progress Notes (Signed)
Pt here for patient teaching.  Pt given Radiation and You booklet, skin care instructions, Alra deodorant and Radiaplex gel.  Reviewed areas of pertinence such as fatigue, hair loss, skin changes, breast tenderness and breast swelling . Pt able to give teach back of to pat skin, use unscented/gentle soap and drink plenty of water,apply Radiaplex bid, avoid applying anything to skin within 4 hours of treatment, avoid wearing an under wire bra and to use an electric razor if they must shave. Pt verbalizes understanding of information given and will contact nursing with any questions or concerns.     Http://rtanswers.org/treatmentinformation/whattoexpect/index      

## 2017-06-29 ENCOUNTER — Ambulatory Visit
Admission: RE | Admit: 2017-06-29 | Discharge: 2017-06-29 | Disposition: A | Discharge status: Home / Self Care | Payer: Medicare HMO | Source: Ambulatory Visit | Attending: Radiation Oncology | Admitting: Radiation Oncology

## 2017-06-29 DIAGNOSIS — Z79899 Other long term (current) drug therapy: Secondary | ICD-10-CM | POA: Diagnosis not present

## 2017-06-29 DIAGNOSIS — C50512 Malignant neoplasm of lower-outer quadrant of left female breast: Secondary | ICD-10-CM | POA: Diagnosis not present

## 2017-06-29 DIAGNOSIS — Z170 Estrogen receptor positive status [ER+]: Secondary | ICD-10-CM | POA: Diagnosis not present

## 2017-06-29 DIAGNOSIS — Z87891 Personal history of nicotine dependence: Secondary | ICD-10-CM | POA: Diagnosis not present

## 2017-06-29 DIAGNOSIS — Z510 Encounter for antineoplastic radiation therapy: Secondary | ICD-10-CM | POA: Diagnosis not present

## 2017-07-02 ENCOUNTER — Ambulatory Visit
Admission: RE | Admit: 2017-07-02 | Discharge: 2017-07-02 | Disposition: A | Discharge status: Home / Self Care | Payer: Medicare HMO | Source: Ambulatory Visit | Attending: Radiation Oncology | Admitting: Radiation Oncology

## 2017-07-02 DIAGNOSIS — C50512 Malignant neoplasm of lower-outer quadrant of left female breast: Secondary | ICD-10-CM | POA: Diagnosis not present

## 2017-07-02 DIAGNOSIS — Z170 Estrogen receptor positive status [ER+]: Secondary | ICD-10-CM | POA: Diagnosis not present

## 2017-07-02 DIAGNOSIS — Z79899 Other long term (current) drug therapy: Secondary | ICD-10-CM | POA: Diagnosis not present

## 2017-07-02 DIAGNOSIS — Z87891 Personal history of nicotine dependence: Secondary | ICD-10-CM | POA: Diagnosis not present

## 2017-07-02 DIAGNOSIS — Z510 Encounter for antineoplastic radiation therapy: Secondary | ICD-10-CM | POA: Diagnosis not present

## 2017-07-03 ENCOUNTER — Ambulatory Visit
Admission: RE | Admit: 2017-07-03 | Discharge: 2017-07-03 | Disposition: A | Discharge status: Home / Self Care | Payer: Medicare HMO | Source: Ambulatory Visit | Attending: Radiation Oncology | Admitting: Radiation Oncology

## 2017-07-03 DIAGNOSIS — Z510 Encounter for antineoplastic radiation therapy: Secondary | ICD-10-CM | POA: Diagnosis present

## 2017-07-03 DIAGNOSIS — Z170 Estrogen receptor positive status [ER+]: Secondary | ICD-10-CM | POA: Diagnosis not present

## 2017-07-03 DIAGNOSIS — C50512 Malignant neoplasm of lower-outer quadrant of left female breast: Secondary | ICD-10-CM | POA: Insufficient documentation

## 2017-07-04 ENCOUNTER — Ambulatory Visit
Admission: RE | Admit: 2017-07-04 | Discharge: 2017-07-04 | Disposition: A | Discharge status: Home / Self Care | Payer: Medicare HMO | Source: Ambulatory Visit | Attending: Radiation Oncology | Admitting: Radiation Oncology

## 2017-07-04 DIAGNOSIS — Z510 Encounter for antineoplastic radiation therapy: Secondary | ICD-10-CM | POA: Diagnosis not present

## 2017-07-04 DIAGNOSIS — C50512 Malignant neoplasm of lower-outer quadrant of left female breast: Secondary | ICD-10-CM | POA: Diagnosis not present

## 2017-07-04 DIAGNOSIS — Z170 Estrogen receptor positive status [ER+]: Secondary | ICD-10-CM | POA: Diagnosis not present

## 2017-07-05 ENCOUNTER — Ambulatory Visit
Admission: RE | Admit: 2017-07-05 | Discharge: 2017-07-05 | Disposition: A | Discharge status: Home / Self Care | Payer: Medicare HMO | Source: Ambulatory Visit | Attending: Radiation Oncology | Admitting: Radiation Oncology

## 2017-07-05 DIAGNOSIS — Z510 Encounter for antineoplastic radiation therapy: Secondary | ICD-10-CM | POA: Diagnosis not present

## 2017-07-05 DIAGNOSIS — Z170 Estrogen receptor positive status [ER+]: Secondary | ICD-10-CM | POA: Diagnosis not present

## 2017-07-05 DIAGNOSIS — C50512 Malignant neoplasm of lower-outer quadrant of left female breast: Secondary | ICD-10-CM | POA: Diagnosis not present

## 2017-07-06 ENCOUNTER — Ambulatory Visit
Admission: RE | Admit: 2017-07-06 | Discharge: 2017-07-06 | Disposition: A | Discharge status: Home / Self Care | Payer: Medicare HMO | Source: Ambulatory Visit | Attending: Radiation Oncology | Admitting: Radiation Oncology

## 2017-07-06 DIAGNOSIS — Z510 Encounter for antineoplastic radiation therapy: Secondary | ICD-10-CM | POA: Diagnosis not present

## 2017-07-06 DIAGNOSIS — C50512 Malignant neoplasm of lower-outer quadrant of left female breast: Secondary | ICD-10-CM | POA: Diagnosis not present

## 2017-07-06 DIAGNOSIS — Z170 Estrogen receptor positive status [ER+]: Secondary | ICD-10-CM | POA: Diagnosis not present

## 2017-07-09 ENCOUNTER — Ambulatory Visit: Payer: Medicare HMO | Admitting: Radiation Oncology

## 2017-07-09 ENCOUNTER — Ambulatory Visit
Admission: RE | Admit: 2017-07-09 | Discharge: 2017-07-09 | Disposition: A | Discharge status: Home / Self Care | Payer: Medicare HMO | Source: Ambulatory Visit | Attending: Radiation Oncology | Admitting: Radiation Oncology

## 2017-07-09 DIAGNOSIS — C50512 Malignant neoplasm of lower-outer quadrant of left female breast: Secondary | ICD-10-CM | POA: Diagnosis not present

## 2017-07-09 DIAGNOSIS — Z510 Encounter for antineoplastic radiation therapy: Secondary | ICD-10-CM | POA: Diagnosis not present

## 2017-07-09 DIAGNOSIS — Z170 Estrogen receptor positive status [ER+]: Secondary | ICD-10-CM | POA: Diagnosis not present

## 2017-07-10 ENCOUNTER — Ambulatory Visit
Admission: RE | Admit: 2017-07-10 | Discharge: 2017-07-10 | Disposition: A | Discharge status: Home / Self Care | Payer: Medicare HMO | Source: Ambulatory Visit | Attending: Radiation Oncology | Admitting: Radiation Oncology

## 2017-07-10 DIAGNOSIS — Z510 Encounter for antineoplastic radiation therapy: Secondary | ICD-10-CM | POA: Diagnosis not present

## 2017-07-10 DIAGNOSIS — C50512 Malignant neoplasm of lower-outer quadrant of left female breast: Secondary | ICD-10-CM | POA: Diagnosis not present

## 2017-07-10 DIAGNOSIS — Z170 Estrogen receptor positive status [ER+]: Secondary | ICD-10-CM | POA: Diagnosis not present

## 2017-07-11 ENCOUNTER — Ambulatory Visit
Admission: RE | Admit: 2017-07-11 | Discharge: 2017-07-11 | Disposition: A | Discharge status: Home / Self Care | Payer: Medicare HMO | Source: Ambulatory Visit | Attending: Radiation Oncology | Admitting: Radiation Oncology

## 2017-07-11 DIAGNOSIS — Z510 Encounter for antineoplastic radiation therapy: Secondary | ICD-10-CM | POA: Diagnosis not present

## 2017-07-11 DIAGNOSIS — Z170 Estrogen receptor positive status [ER+]: Secondary | ICD-10-CM | POA: Diagnosis not present

## 2017-07-11 DIAGNOSIS — C50512 Malignant neoplasm of lower-outer quadrant of left female breast: Secondary | ICD-10-CM | POA: Diagnosis not present

## 2017-07-12 ENCOUNTER — Ambulatory Visit
Admission: RE | Admit: 2017-07-12 | Discharge: 2017-07-12 | Disposition: A | Discharge status: Home / Self Care | Payer: Medicare HMO | Source: Ambulatory Visit | Attending: Radiation Oncology | Admitting: Radiation Oncology

## 2017-07-12 DIAGNOSIS — Z170 Estrogen receptor positive status [ER+]: Secondary | ICD-10-CM | POA: Diagnosis not present

## 2017-07-12 DIAGNOSIS — Z510 Encounter for antineoplastic radiation therapy: Secondary | ICD-10-CM | POA: Diagnosis not present

## 2017-07-12 DIAGNOSIS — C50512 Malignant neoplasm of lower-outer quadrant of left female breast: Secondary | ICD-10-CM | POA: Diagnosis not present

## 2017-07-13 ENCOUNTER — Ambulatory Visit
Admission: RE | Admit: 2017-07-13 | Discharge: 2017-07-13 | Disposition: A | Discharge status: Home / Self Care | Payer: Medicare HMO | Source: Ambulatory Visit | Attending: Radiation Oncology | Admitting: Radiation Oncology

## 2017-07-13 DIAGNOSIS — Z510 Encounter for antineoplastic radiation therapy: Secondary | ICD-10-CM | POA: Diagnosis not present

## 2017-07-13 DIAGNOSIS — C50512 Malignant neoplasm of lower-outer quadrant of left female breast: Secondary | ICD-10-CM | POA: Diagnosis not present

## 2017-07-13 DIAGNOSIS — Z170 Estrogen receptor positive status [ER+]: Secondary | ICD-10-CM | POA: Diagnosis not present

## 2017-07-16 ENCOUNTER — Ambulatory Visit
Admission: RE | Admit: 2017-07-16 | Discharge: 2017-07-16 | Disposition: A | Discharge status: Home / Self Care | Payer: Medicare HMO | Source: Ambulatory Visit | Attending: Radiation Oncology | Admitting: Radiation Oncology

## 2017-07-16 DIAGNOSIS — Z170 Estrogen receptor positive status [ER+]: Secondary | ICD-10-CM | POA: Diagnosis not present

## 2017-07-16 DIAGNOSIS — Z510 Encounter for antineoplastic radiation therapy: Secondary | ICD-10-CM | POA: Diagnosis not present

## 2017-07-16 DIAGNOSIS — C50512 Malignant neoplasm of lower-outer quadrant of left female breast: Secondary | ICD-10-CM | POA: Diagnosis not present

## 2017-07-17 ENCOUNTER — Ambulatory Visit
Admission: RE | Admit: 2017-07-17 | Discharge: 2017-07-17 | Disposition: A | Discharge status: Home / Self Care | Payer: Medicare HMO | Source: Ambulatory Visit | Attending: Radiation Oncology | Admitting: Radiation Oncology

## 2017-07-17 DIAGNOSIS — Z510 Encounter for antineoplastic radiation therapy: Secondary | ICD-10-CM | POA: Diagnosis not present

## 2017-07-17 DIAGNOSIS — Z170 Estrogen receptor positive status [ER+]: Secondary | ICD-10-CM | POA: Diagnosis not present

## 2017-07-17 DIAGNOSIS — C50512 Malignant neoplasm of lower-outer quadrant of left female breast: Secondary | ICD-10-CM | POA: Diagnosis not present

## 2017-07-18 ENCOUNTER — Ambulatory Visit
Admission: RE | Admit: 2017-07-18 | Discharge: 2017-07-18 | Disposition: A | Discharge status: Home / Self Care | Payer: Medicare HMO | Source: Ambulatory Visit | Attending: Radiation Oncology | Admitting: Radiation Oncology

## 2017-07-18 DIAGNOSIS — Z170 Estrogen receptor positive status [ER+]: Secondary | ICD-10-CM | POA: Diagnosis not present

## 2017-07-18 DIAGNOSIS — Z510 Encounter for antineoplastic radiation therapy: Secondary | ICD-10-CM | POA: Diagnosis not present

## 2017-07-18 DIAGNOSIS — C50512 Malignant neoplasm of lower-outer quadrant of left female breast: Secondary | ICD-10-CM | POA: Diagnosis not present

## 2017-07-19 ENCOUNTER — Ambulatory Visit (HOSPITAL_BASED_OUTPATIENT_CLINIC_OR_DEPARTMENT_OTHER): Payer: Medicare HMO | Admitting: Hematology and Oncology

## 2017-07-19 ENCOUNTER — Ambulatory Visit
Admission: RE | Admit: 2017-07-19 | Discharge: 2017-07-19 | Disposition: A | Discharge status: Home / Self Care | Payer: Medicare HMO | Source: Ambulatory Visit | Attending: Radiation Oncology | Admitting: Radiation Oncology

## 2017-07-19 ENCOUNTER — Encounter: Payer: Self-pay | Admitting: Hematology and Oncology

## 2017-07-19 DIAGNOSIS — D0512 Intraductal carcinoma in situ of left breast: Secondary | ICD-10-CM

## 2017-07-19 DIAGNOSIS — C50512 Malignant neoplasm of lower-outer quadrant of left female breast: Secondary | ICD-10-CM | POA: Diagnosis not present

## 2017-07-19 DIAGNOSIS — Z170 Estrogen receptor positive status [ER+]: Secondary | ICD-10-CM | POA: Diagnosis not present

## 2017-07-19 DIAGNOSIS — Z510 Encounter for antineoplastic radiation therapy: Secondary | ICD-10-CM | POA: Diagnosis not present

## 2017-07-19 NOTE — Assessment & Plan Note (Signed)
05/03/2017 Left lumpectomy: DCIS intermediate grade, left additional lateral margin residual DCIS intermediate grade, closest margin 0.1 cm, ER 95%, PR 95%, Tis Nx stage 0 Adjuvant radiation therapy 06/25/2017-07/19/2017 Patient refused antiestrogen therapy.  She'll be followed by Dr. Marlou Starks and her primary care physician for her mammograms and surveillance. Happy to see her on an as-needed basis.

## 2017-07-19 NOTE — Progress Notes (Signed)
Patient Care Team: Midge Minium, MD as PCP - General (Family Medicine) Paula Compton, MD as Consulting Physician (Obstetrics and Gynecology) Gloris Manchester, MD (Neurosurgery) Paulla Dolly Tamala Fothergill, DPM as Consulting Physician (Podiatry) Marica Otter, OD (Optometry) Jovita Kussmaul, MD as Consulting Physician (General Surgery) Nicholas Lose, MD as Consulting Physician (Hematology and Oncology) Eppie Gibson, MD as Attending Physician (Radiation Oncology)  DIAGNOSIS:  Encounter Diagnosis  Name Primary?  . Malignant neoplasm of lower-outer quadrant of left breast of female, estrogen receptor positive ([place])     SUMMARY OF ONCOLOGIC HISTORY:   Malignant neoplasm of lower-outer quadrant of left breast of female, estrogen receptor positive ([place])   03/26/2017 Initial Diagnosis    Screening detected bilateral calcifications. Left breast spanning 3 mm at 6:00 position, Left breast biopsy lower outer quadrant: Calcifications with high-grade DCIS, ER/PR positive Tis N0 stage 0      05/03/2017 Surgery    Left lumpectomy: DCIS intermediate grade, left additional lateral margin residual DCIS intermediate grade, closest margin 0.1 cm, ER 95%, PR 95%, Tis Nx stage 0      06/25/2017 - 07/19/2017 Radiation Therapy    Adjuvant radiation       CHIEF COMPLIANT: follow-up on adjuvant radiation  INTERVAL HISTORY: GLENDALE WHERRY is a 76 year old with above-mentioned history left breast cancer who has undergone lumpectomy and is finishing radiation the next few days. She is here for follow-up. She reports that she is doing very well from the radiation standpoint. She has mild redness of the skin. Denies any pain or discomfort. She is struggling mostly with her back issues. She has undergone physical therapy and still has back spasms.  REVIEW OF SYSTEMS:   Constitutional: Denies fevers, chills or abnormal weight loss Eyes: Denies blurriness of vision Ears, nose, mouth, throat, and face: Denies  mucositis or sore throat Respiratory: Denies cough, dyspnea or wheezes Cardiovascular: Denies palpitation, chest discomfort Gastrointestinal:  Denies nausea, heartburn or change in bowel habits Skin: Denies abnormal skin rashes Lymphatics: Denies new lymphadenopathy or easy bruising Neurological:Denies numbness, tingling or new weaknesses Behavioral/Psych: Mood is stable, no new changes  Extremities: No lower extremity edema Breast: mild radiation dermatitis All other systems were reviewed with the patient and are negative.  I have reviewed the past medical history, past surgical history, social history and family history with the patient and they are unchanged from previous note.  ALLERGIES:  is allergic to no known allergies.  MEDICATIONS:  Current Outpatient Prescriptions  Medication Sig Dispense Refill  . acetaminophen (TYLENOL) 500 MG tablet Take 1,000 mg by mouth 3 (three) times daily as needed for moderate pain.    . Biotin 2.5 MG CAPS Take 2.5 mg by mouth daily.     . Carboxymethylcellul-Glycerin (LUBRICATING EYE DROPS OP) Apply 1 drop to eye daily as needed (dry eyes).    . Cholecalciferol (VITAMIN D) 2000 UNITS CAPS Take 2,000 Units by mouth daily.     . diazepam (VALIUM) 5 MG tablet Take 5 mg by mouth 2 (two) times daily as needed for muscle spasms.     Marland Kitchen estradiol (ESTRACE) 0.1 MG/GM vaginal cream Place 0.5 g vaginally 2 (two) times a week.     . fexofenadine (ALLEGRA) 180 MG tablet Take 180 mg by mouth daily.    . fluconazole (DIFLUCAN) 150 MG tablet Take 1 tablet (150 mg total) by mouth daily. Can take second pill 72 hours later if symptoms do not resolve 2 tablet 0  . fluticasone (FLONASE) 50 MCG/ACT nasal  spray Place 1 spray into both nostrils at bedtime.     Marland Kitchen GLUCOSAMINE-CHONDROITIN PO Take 3 tablets by mouth daily.    . Lidocaine 4 % PTCH Apply 2 patches topically daily as needed (pain).    . Methylcellulose, Laxative, (CITRUCEL PO) Take 2 tablets by mouth daily.      . Nutritional Supplements (JUICE PLUS FIBRE PO) Take by mouth. Juice plus fruit 4 tablets daily Juice plus veggie 4 tablets daily    . Probiotic Product (ADVANCED PROBIOTIC 10) CAPS Take 1 capsule by mouth daily.    . traMADol (ULTRAM) 50 MG tablet Take 50 mg by mouth 2 (two) times daily as needed for moderate pain.     . Turmeric 500 MG TABS Take 1,000 mg by mouth daily.     No current facility-administered medications for this visit.     PHYSICAL EXAMINATION: ECOG PERFORMANCE STATUS: 1 - Symptomatic but completely ambulatory  Vitals:   07/19/17 0946  BP: (!) 162/84  Pulse: 71  Resp: 18  Temp: (!) 97.5 F (36.4 C)  SpO2: 100%   Filed Weights   07/19/17 0946  Weight: 142 lb 14.4 oz (64.8 kg)    GENERAL:alert, no distress and comfortable SKIN: skin color, texture, turgor are normal, no rashes or significant lesions EYES: normal, Conjunctiva are pink and non-injected, sclera clear OROPHARYNX:no exudate, no erythema and lips, buccal mucosa, and tongue normal  NECK: supple, thyroid normal size, non-tender, without nodularity LYMPH:  no palpable lymphadenopathy in the cervical, axillary or inguinal LUNGS: clear to auscultation and percussion with normal breathing effort HEART: regular rate & rhythm and no murmurs and no lower extremity edema ABDOMEN:abdomen soft, non-tender and normal bowel sounds MUSCULOSKELETAL:no cyanosis of digits and no clubbing  NEURO: alert & oriented x 3 with fluent speech, no focal motor/sensory deficits EXTREMITIES: No lower extremity edema  LABORATORY DATA:  I have reviewed the data as listed   Chemistry      Component Value Date/Time   NA 134 (L) 04/30/2017 1309   NA 136 04/04/2017 1223   K 4.3 04/30/2017 1309   K 4.2 04/04/2017 1223   CL 102 04/30/2017 1309   CO2 25 04/30/2017 1309   CO2 27 04/04/2017 1223   BUN 8 04/30/2017 1309   BUN 11.1 04/04/2017 1223   CREATININE 0.67 04/30/2017 1309   CREATININE 0.8 04/04/2017 1223       Component Value Date/Time   CALCIUM 9.8 04/30/2017 1309   CALCIUM 10.1 04/04/2017 1223   ALKPHOS 74 04/04/2017 1223   AST 14 04/04/2017 1223   ALT 8 04/04/2017 1223   BILITOT 0.47 04/04/2017 1223       Lab Results  Component Value Date   WBC 5.3 04/30/2017   HGB 11.5 (L) 04/30/2017   HCT 34.6 (L) 04/30/2017   MCV 93.0 04/30/2017   PLT 375 04/30/2017   NEUTROABS 4.2 04/04/2017    ASSESSMENT & PLAN:  Malignant neoplasm of lower-outer quadrant of left breast of female, estrogen receptor positive ([place]) 05/03/2017 Left lumpectomy: DCIS intermediate grade, left additional lateral margin residual DCIS intermediate grade, closest margin 0.1 cm, ER 95%, PR 95%, Tis Nx stage 0 Adjuvant radiation therapy 06/25/2017-07/19/2017 Patient refused antiestrogen therapy. Patient understands the risk of relapse after lumpectomy and radiation according to the [facility] prognostic calculator this is around 6%. If she took antiestrogen therapy, the risk would. Use down to 1-2%. She is comfortable with these numbers and decided that she does not want to take antiestrogen therapy.  She'll be followed by Dr. Marlou Starks and her primary care physician for her mammograms and surveillance. Happy to see her on an as-needed basis.  I spent 15 minutes talking to the patient of which more than half was spent in counseling and coordination of care.  No orders of the defined types were placed in this encounter.  The patient has a good understanding of the overall plan. she agrees with it. she will call with any problems that may develop before the next visit here.   Rulon Eisenmenger, MD 07/19/17

## 2017-07-20 ENCOUNTER — Encounter: Payer: Self-pay | Admitting: *Deleted

## 2017-07-20 ENCOUNTER — Telehealth: Payer: Self-pay | Admitting: Adult Health

## 2017-07-20 ENCOUNTER — Ambulatory Visit
Admission: RE | Admit: 2017-07-20 | Discharge: 2017-07-20 | Disposition: A | Discharge status: Home / Self Care | Payer: Medicare HMO | Source: Ambulatory Visit | Attending: Radiation Oncology | Admitting: Radiation Oncology

## 2017-07-20 DIAGNOSIS — C50512 Malignant neoplasm of lower-outer quadrant of left female breast: Secondary | ICD-10-CM | POA: Diagnosis not present

## 2017-07-20 DIAGNOSIS — Z170 Estrogen receptor positive status [ER+]: Principal | ICD-10-CM

## 2017-07-20 DIAGNOSIS — Z510 Encounter for antineoplastic radiation therapy: Secondary | ICD-10-CM | POA: Diagnosis not present

## 2017-07-20 MED ORDER — RADIAPLEXRX EX GEL
Freq: Once | CUTANEOUS | Status: AC
  Administered 2017-07-20: 09:00:00 via TOPICAL

## 2017-07-20 NOTE — Telephone Encounter (Signed)
Spoke with patient and scheduled her survivorship care plan visit.

## 2017-07-24 ENCOUNTER — Ambulatory Visit
Admission: RE | Admit: 2017-07-24 | Discharge: 2017-07-24 | Disposition: A | Discharge status: Home / Self Care | Payer: Medicare HMO | Source: Ambulatory Visit | Attending: Radiation Oncology | Admitting: Radiation Oncology

## 2017-07-24 ENCOUNTER — Encounter: Payer: Self-pay | Admitting: Radiation Oncology

## 2017-07-24 DIAGNOSIS — Z510 Encounter for antineoplastic radiation therapy: Secondary | ICD-10-CM | POA: Diagnosis not present

## 2017-07-24 DIAGNOSIS — C50512 Malignant neoplasm of lower-outer quadrant of left female breast: Secondary | ICD-10-CM | POA: Diagnosis not present

## 2017-07-24 DIAGNOSIS — Z170 Estrogen receptor positive status [ER+]: Secondary | ICD-10-CM | POA: Diagnosis not present

## 2017-07-26 DIAGNOSIS — L723 Sebaceous cyst: Secondary | ICD-10-CM | POA: Diagnosis not present

## 2017-07-27 NOTE — Progress Notes (Signed)
  Radiation Oncology         [phone] ________________________________  Name: Elizabeth [name] MRN: [id number]  Date: 07/24/2017  DOB: 1941-03-18  End of Treatment Note  Diagnosis:   76 y.o. 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  Beams/energy:    Primary: 3D // 6X Photon Boost: electrons // 9E Photon  Narrative: The patient tolerated radiation treatment relatively well.  She denies any breast pain but does have chronic back pain. She attributes her mild fatigue to her back surgery. She also has irritation and redness to her left breast that she has been using Radiaplex for as directed.   Plan: The patient has completed radiation treatment. The patient will return to radiation oncology clinic for routine followup in one month. I advised them to call or return sooner if they have any questions or concerns related to their recovery or treatment. For skin irritiation, I advised her to apply 1% hydrocortisone cream a few times a day as needed then transition to Vitamin E lotion.   -----------------------------------  Eppie Gibson, MD  This document serves as a record of services personally performed by Eppie Gibson, MD. It was created on her behalf by Rae Lips, a trained medical scribe. The creation of this record is based on the scribe's personal observations and the provider's statements to them. This document has been checked and approved by the attending provider.

## 2017-07-30 ENCOUNTER — Ambulatory Visit (INDEPENDENT_AMBULATORY_CARE_PROVIDER_SITE_OTHER): Payer: Medicare HMO

## 2017-07-30 DIAGNOSIS — Z23 Encounter for immunization: Secondary | ICD-10-CM | POA: Diagnosis not present

## 2017-08-01 DIAGNOSIS — H6123 Impacted cerumen, bilateral: Secondary | ICD-10-CM | POA: Diagnosis not present

## 2017-08-01 DIAGNOSIS — H903 Sensorineural hearing loss, bilateral: Secondary | ICD-10-CM | POA: Diagnosis not present

## 2017-08-15 ENCOUNTER — Telehealth: Payer: Self-pay

## 2017-08-15 NOTE — Telephone Encounter (Signed)
Called patient to confirm SCP visit on 08/17/17.  Patient states she will be here.

## 2017-08-17 ENCOUNTER — Encounter: Payer: Self-pay | Admitting: Adult Health

## 2017-08-17 ENCOUNTER — Ambulatory Visit (HOSPITAL_BASED_OUTPATIENT_CLINIC_OR_DEPARTMENT_OTHER): Payer: Medicare HMO | Admitting: Adult Health

## 2017-08-17 VITALS — BP 141/73 | HR 81 | Temp 98.1°F | Resp 18 | Ht 66.5 in | Wt 140.9 lb

## 2017-08-17 DIAGNOSIS — D0512 Intraductal carcinoma in situ of left breast: Secondary | ICD-10-CM | POA: Diagnosis not present

## 2017-08-17 DIAGNOSIS — Z170 Estrogen receptor positive status [ER+]: Secondary | ICD-10-CM

## 2017-08-17 DIAGNOSIS — C50512 Malignant neoplasm of lower-outer quadrant of left female breast: Secondary | ICD-10-CM

## 2017-08-17 NOTE — Progress Notes (Signed)
CLINIC:  Survivorship   REASON FOR VISIT:  Routine follow-up post-treatment for a recent history of breast cancer.  BRIEF ONCOLOGIC HISTORY:    Malignant neoplasm of lower-outer quadrant of left breast of female, estrogen receptor positive ([place])   03/26/2017 Initial Diagnosis    Screening detected bilateral calcifications. Left breast spanning 3 mm at 6:00 position, Left breast biopsy lower outer quadrant: Calcifications with high-grade DCIS, ER/PR positive Tis N0 stage 0      05/03/2017 Surgery    Left lumpectomy: DCIS intermediate grade, left additional lateral margin residual DCIS intermediate grade, closest margin 0.1 cm, ER 95%, PR 95%, Tis Nx stage 0      06/26/2017 - 07/24/2017 Radiation Therapy    Adjuvant radiation      06/2017 -  Anti-estrogen oral therapy    Declined anti estrogen therapy       INTERVAL HISTORY:  Elizabeth [name] presents to the [facility] today for our initial meeting to review her survivorship care plan detailing her treatment course for breast cancer, as well as monitoring long-term side effects of that treatment, education regarding health maintenance, screening, and overall wellness and health promotion.     Overall, Elizabeth [name] reports feeling quite well.  She is doing much better since finishing radiation.  She sees her PCP regularly.  She exercises regularly.  She is without questions or concerns today.  Elizabeth [name] does have some muscle spasm in her back from previous back surgery.  She does Yoga regularly to help with this.      REVIEW OF SYSTEMS:  Review of Systems  Constitutional: Negative for appetite change, chills, fatigue, fever and unexpected weight change.  HENT:   Negative for hearing loss and lump/mass.   Eyes: Negative for eye problems and icterus.  Respiratory: Negative for chest tightness, cough and shortness of breath.   Cardiovascular: Negative for chest pain, leg swelling and palpitations.  Gastrointestinal: Negative for  abdominal distention, abdominal pain, constipation, diarrhea, nausea and vomiting.  Endocrine: Negative for hot flashes.  Genitourinary: Negative for difficulty urinating and dyspareunia.   Skin: Negative for itching and rash.  Neurological: Negative for dizziness, extremity weakness, headaches and numbness.  Hematological: Negative for adenopathy. Does not bruise/bleed easily.  Psychiatric/Behavioral: Negative for depression. The patient is not nervous/anxious.   Breast: Denies any new nodularity, masses, tenderness, nipple changes, or nipple discharge.      ONCOLOGY TREATMENT TEAM:  1. Surgeon:  Dr[facility]ou [facility]arks at [facility] 2. Medical Oncologist: Dr. Lindi Adie  3. Radiation Oncologist: Dr. Isidore Moos    PAST MEDICAL/SURGICAL HISTORY:  Past Medical History:  Diagnosis Date  . Arthritis   . Cancer ([place])    pre cancer   . Chicken pox   . Shingles 07-2002  . Sleep apnea    mild uses oral devise  . Urinary tract infection    Past Surgical History:  Procedure Laterality Date  . BREAST LUMPECTOMY WITH RADIOACTIVE SEED LOCALIZATION Left 05/03/2017   Procedure: LEFT BREAST LUMPECTOMY WITH RADIOACTIVE SEED LOCALIZATION;  Surgeon: Jovita Kussmaul, MD;  Location: [place];  Service: General;  Laterality: Left;  . COLONOSCOPY    . HYSTEROSCOPY     Dr Ubaldo Glassing  . SPINAL FUSION  12/01/2016   T10 to Pelvis  . WISDOM TOOTH EXTRACTION     age 89's     ALLERGIES:  Allergies  Allergen Reactions  . No Known Allergies      CURRENT MEDICATIONS:  Outpatient Encounter Prescriptions as of 08/17/2017  Medication  Sig  . acetaminophen (TYLENOL) 500 MG tablet Take 1,000 mg by mouth 3 (three) times daily as needed for moderate pain.  . Biotin 2.5 MG CAPS Take 2.5 mg by mouth daily.   . Carboxymethylcellul-Glycerin (LUBRICATING EYE DROPS OP) Apply 1 drop to eye daily as needed (dry eyes).  . Cholecalciferol (VITAMIN D) 2000 UNITS CAPS Take 2,000 Units by mouth daily.   . diazepam (VALIUM) 5 MG  tablet Take 5 mg by mouth as needed for muscle spasms.   . fexofenadine (ALLEGRA) 180 MG tablet Take 180 mg by mouth daily.  . fluconazole (DIFLUCAN) 150 MG tablet Take 1 tablet (150 mg total) by mouth daily. Can take second pill 72 hours later if symptoms do not resolve  . fluticasone (FLONASE) 50 MCG/ACT nasal spray Place 1 spray into both nostrils at bedtime.   Marland Kitchen GLUCOSAMINE-CHONDROITIN PO Take 3 tablets by mouth daily.  . Lidocaine 4 % PTCH Apply 2 patches topically daily as needed (pain).  . Methylcellulose, Laxative, (CITRUCEL PO) Take 2 tablets by mouth daily.  . Nutritional Supplements (JUICE PLUS FIBRE PO) Take by mouth. Juice plus fruit 4 tablets daily Juice plus veggie 4 tablets daily  . Probiotic Product (ADVANCED PROBIOTIC 10) CAPS Take 1 capsule by mouth daily.  . traMADol (ULTRAM) 50 MG tablet Take 50 mg by mouth as needed for moderate pain.   . Turmeric 500 MG TABS Take 1,000 mg by mouth daily.  . [DISCONTINUED] estradiol (ESTRACE) 0.1 MG/GM vaginal cream Place 0.5 g vaginally 2 (two) times a week.    No facility-administered encounter medications on file as of 08/17/2017.      ONCOLOGIC FAMILY HISTORY:  Family History  Problem Relation Age of Onset  . Diabetes Mother   . Arthritis Father   . Cancer Maternal Aunt        liver  . Heart disease Paternal Grandfather   . Colon cancer Neg Hx   . Pancreatic cancer Neg Hx   . Stomach cancer Neg Hx      GENETIC COUNSELING/TESTING: Not at this time  SOCIAL HISTORY:  Elizabeth [name] is married and lives with her husband in [place], [place].  She has 2 children and they live in [place] and [place], [place].  Elizabeth [name] is currently retired.  She denies any current or history of tobacco, alcohol, or illicit drug use.     PHYSICAL EXAMINATION:  Vital Signs:   Vitals:   08/17/17 0856  BP: (!) 141/73  Pulse: 81  Resp: 18  Temp: 98.1 F (36.7 C)  SpO2: 98%   Filed Weights   08/17/17 0856  Weight: 140  lb 14.4 oz (63.9 kg)   General: Well-nourished, well-appearing female in no acute distress.  She is unaccompanied today.   HEENT: Head is normocephalic.  Pupils equal and reactive to light. Conjunctivae clear without exudate.  Sclerae anicteric. Oral mucosa is pink, moist.  Oropharynx is pink without lesions or erythema.  Lymph: No cervical, supraclavicular, or infraclavicular lymphadenopathy noted on palpation.  Cardiovascular: Regular rate and rhythm.Marland Kitchen Respiratory: Clear to auscultation bilaterally. Chest expansion symmetric; breathing non-labored.  GI: Abdomen soft and round; non-tender, non-distended. Bowel sounds normoactive.  GU: Deferred.  Neuro: No focal deficits. Steady gait.  Psych: Mood and affect normal and appropriate for situation.  Extremities: No edema. MSK: No focal spinal tenderness to palpation.  Full range of motion in bilateral upper extremities Skin: Warm and dry.  LABORATORY DATA:  None for this visit.  DIAGNOSTIC IMAGING:  None for this visit.      ASSESSMENT AND PLAN:  Elizabeth [name] is a pleasant 76 y.o. female with Stage 0 left breast DCIS, ER+/PR+, diagnosed in 03/2017, treated with lumpectomy and adjuvant radiation therapy.  She presents to the Survivorship Clinic for our initial meeting and routine follow-up post-completion of treatment for breast cancer.    1. Stage 0 left breast cancer:  Elizabeth [name] is continuing to recover from definitive treatment for breast cancer. She will follow-up with Dr. Marlou Starks and Dr. Birdie Riddle for her breast caner surveillance.  She declined anti-estrogen therapy at this time.  I did place an order today for her annual mammogram which is due in 02/2018. Today, a comprehensive survivorship care plan and treatment summary was reviewed with the patient today detailing her breast cancer diagnosis, treatment course, potential late/long-term effects of treatment, appropriate follow-up care with recommendations for the future, and patient  education resources.  A copy of this summary, along with a letter will be sent to the patient's primary care provider via mail/fax/In Basket message after today's visit.    2. Bone health:  Given Elizabeth [name]'s age/history of breast cancer , she is at risk for bone demineralization.  Her last DEXA scan was in 11/2016 and she says it was good.  I will defer to her PCP regarding further bone density testing and the management of results.  She was encouraged to increase her consumption of foods rich in calcium, as well as increase her weight-bearing activities.  She was given education on specific activities to promote bone health.  3. Cancer screening:  Due to Elizabeth [name]'s history and her age, she should receive screening for skin cancers, colon cancer, and gynecologic cancers.  The information and recommendations are listed on the patient's comprehensive care plan/treatment summary and were reviewed in detail with the patient.    4. Health maintenance and wellness promotion: Elizabeth [name] was encouraged to consume 5-7 servings of fruits and vegetables per day. We reviewed the "Nutrition Rainbow" handout, as well as the handout "Take Control of Your Health and Reduce Your Cancer Risk" from the [place].  She was also encouraged to engage in moderate to vigorous exercise for 30 minutes per day most days of the week. We discussed the [employer] fitness program, which is designed for cancer survivors to help them become more physically fit after cancer treatments.  She was instructed to limit her alcohol consumption and continue to abstain from tobacco use..     5. Support services/counseling: It is not uncommon for this period of the patient's cancer care trajectory to be one of many emotions and stressors.  We discussed an opportunity for her to participate in the next session of [facility] ("Finding Your New Normal") support group series designed for patients after they have completed  treatment.   Elizabeth [name] was encouraged to take advantage of our many other support services programs, support groups, and/or counseling in coping with her new life as a cancer survivor after completing anti-cancer treatment.  She was offered support today through active listening and expressive supportive counseling.  She was given information regarding our available services and encouraged to contact me with any questions or for help enrolling in any of our support group/programs.    Dispo:   -Return to cancer center [facility] for follow up with Dr. Lindi Adie -Mammogram due in 02/2018 -Follow up with Dr. Marlou Starks 08/23/2017 -Follow up with Dr. Isidore Moos on 08/24/2017 -She is welcome to return back to  the Survivorship Clinic at any time; no additional follow-up needed at this time.  -Consider referral back to survivorship as a long-term survivor for continued surveillance  A total of (30) minutes of face-to-face time was spent with this patient with greater than 50% of that time in counseling and care-coordination.   Elizabeth [name], Elizabeth [name] [phone]   Note: PRIMARY CARE PROVIDER Elizabeth [name], [place] [phone]

## 2017-08-21 ENCOUNTER — Encounter: Payer: Self-pay | Admitting: Radiation Oncology

## 2017-08-23 DIAGNOSIS — D0512 Intraductal carcinoma in situ of left breast: Secondary | ICD-10-CM | POA: Diagnosis not present

## 2017-08-24 ENCOUNTER — Ambulatory Visit
Admission: RE | Admit: 2017-08-24 | Discharge: 2017-08-24 | Disposition: A | Discharge status: Home / Self Care | Payer: Medicare HMO | Source: Ambulatory Visit | Attending: Radiation Oncology | Admitting: Radiation Oncology

## 2017-08-24 ENCOUNTER — Encounter: Payer: Self-pay | Admitting: Radiation Oncology

## 2017-08-24 DIAGNOSIS — Z170 Estrogen receptor positive status [ER+]: Secondary | ICD-10-CM | POA: Insufficient documentation

## 2017-08-24 DIAGNOSIS — C50512 Malignant neoplasm of lower-outer quadrant of left female breast: Secondary | ICD-10-CM | POA: Insufficient documentation

## 2017-08-24 HISTORY — DX: Personal history of irradiation: Z92.3

## 2017-08-24 NOTE — Progress Notes (Signed)
Elizabeth [name] presents for follow up of radiation completed 07/24/17 to her Left Breast. She denies breast pain. She denies fatigue. She has had her survivorship appointment on 08/17/17 and Dr. Marlou Starks 08/23/17. She declined anti-estrogen therapy. Her skin has healed well, and she continues to use Radiaplex to her Left Breast. She will switch to a vitamin E cream or oil when the Radiaplex is completed.   BP 138/87   Pulse 77   Temp 97.7 F (36.5 C)   Ht 5' 6.5" (1.689 m)   Wt 141 lb 12.8 oz (64.3 kg)   SpO2 99% Comment: room air  BMI 22.54 kg/m    Wt Readings from Last 3 Encounters:  08/24/17 141 lb 12.8 oz (64.3 kg)  08/17/17 140 lb 14.4 oz (63.9 kg)  07/19/17 142 lb 14.4 oz (64.8 kg)

## 2017-08-24 NOTE — Progress Notes (Signed)
Radiation Oncology         [phone] ________________________________  Name: Elizabeth [name] MRN: [id number]  Date: 08/24/2017  DOB: 20-Oct-1941  Follow-Up Visit Note  Outpatient  CC: Midge Minium, MD  Nicholas Lose, MD  Diagnosis and Prior Radiotherapy:    ICD-10-CM   1. Malignant neoplasm of lower-outer quadrant of left breast of female, estrogen receptor positive ([place]) C50.512    Z17.0     Stage 0 pTis pNx Left Breast DCIS, ER/PR Positive, Grade 2  CHIEF COMPLAINT: Here for follow-up and surveillance of left breast cancer  Narrative:  The patient returns today for routine follow-up. Patient underwent treatment on 06/26/2017 - 07/24/2017 that consisted of treatment of left breast to a total of 50.05 Gy, with a primary dose of 40.05 Gy delivered in 15 fractions and a boost dose of 10 Gy delivered in 5 fractions. She is doing well overall. She reports continued chronic back spasms.     ALLERGIES:  is allergic to no known allergies.  Meds: Current Outpatient Prescriptions  Medication Sig Dispense Refill  . acetaminophen (TYLENOL) 500 MG tablet Take 1,000 mg by mouth 3 (three) times daily as needed for moderate pain.    . Biotin 2.5 MG CAPS Take 2.5 mg by mouth daily.     . Carboxymethylcellul-Glycerin (LUBRICATING EYE DROPS OP) Apply 1 drop to eye daily as needed (dry eyes).    . Cholecalciferol (VITAMIN D) 2000 UNITS CAPS Take 2,000 Units by mouth daily.     . diazepam (VALIUM) 5 MG tablet Take 5 mg by mouth as needed for muscle spasms.     . fexofenadine (ALLEGRA) 180 MG tablet Take 180 mg by mouth daily.    . fluticasone (FLONASE) 50 MCG/ACT nasal spray Place 1 spray into both nostrils at bedtime.     Marland Kitchen GLUCOSAMINE-CHONDROITIN PO Take 3 tablets by mouth daily.    . Lidocaine 4 % PTCH Apply 2 patches topically daily as needed (pain).    . Methylcellulose, Laxative, (CITRUCEL PO) Take 2 tablets by mouth daily.    . Nutritional Supplements (JUICE PLUS FIBRE PO) Take  by mouth. Juice plus fruit 4 tablets daily Juice plus veggie 4 tablets daily    . Probiotic Product (ADVANCED PROBIOTIC 10) CAPS Take 1 capsule by mouth daily.    . traMADol (ULTRAM) 50 MG tablet Take 50 mg by mouth as needed for moderate pain.     . Turmeric 500 MG TABS Take 1,000 mg by mouth daily.     No current facility-administered medications for this encounter.     Physical Findings: The patient is in no acute distress. Patient is alert and oriented.  height is 5' 6.5" (1.689 m) and weight is 141 lb 12.8 oz (64.3 kg). Her temperature is 97.7 F (36.5 C). Her blood pressure is 138/87 and her pulse is 77. Her oxygen saturation is 99%.   General: Alert and oriented, in no acute distress HEENT: Head is normocephalic. Extraocular movements are intact. Skin: No concerning lesions. Psychiatric: Judgment and insight are intact. Affect is appropriate. Breasts: Mild residual hyperpigmentation over left breast. Skin is intact and healing well.    Lab Findings: Lab Results  Component Value Date   WBC 5.3 04/30/2017   HGB 11.5 (L) 04/30/2017   HCT 34.6 (L) 04/30/2017   MCV 93.0 04/30/2017   PLT 375 04/30/2017    Radiographic Findings: No results found.  Impression/Plan:  This is a 76 y.o. female with Stage 0 pTis pNx  Left Breast DCIS, ER/PR Positive, Grade 2.   Recommended patient to discontinue using the Radiaplex and I advised the pt to use vitamin E oil or lotions with vitamin E oil to aid in alleviating radiation related skin changes to left breast. Recommended continued follow up with Dr. Marlou Starks. I encouraged her to continue with yearly mammography and follow up with medical oncology. I will see her back on an as-needed basis. I have encouraged her to call if she has any issues or concerns in the future. I wished her the very best.    _____________________________________   Eppie Gibson, MD   This document serves as a record of services personally performed by Eppie Gibson, MD, MD. It was created on her behalf by Marlowe Kays, a trained medical scribe. The creation of this record is based on the scribe's personal observations and the provider's statements to them. This document has been checked and approved by the attending provider.

## 2017-09-05 DIAGNOSIS — M4326 Fusion of spine, lumbar region: Secondary | ICD-10-CM | POA: Diagnosis not present

## 2017-09-06 DIAGNOSIS — L723 Sebaceous cyst: Secondary | ICD-10-CM | POA: Diagnosis not present

## 2017-09-12 DIAGNOSIS — M4326 Fusion of spine, lumbar region: Secondary | ICD-10-CM | POA: Diagnosis not present

## 2017-09-19 ENCOUNTER — Encounter: Payer: Self-pay | Admitting: Family Medicine

## 2017-09-19 ENCOUNTER — Ambulatory Visit (INDEPENDENT_AMBULATORY_CARE_PROVIDER_SITE_OTHER): Payer: Medicare HMO | Admitting: Family Medicine

## 2017-09-19 VITALS — BP 140/81 | HR 89 | Temp 98.2°F | Resp 16 | Ht 67.0 in | Wt 144.0 lb

## 2017-09-19 DIAGNOSIS — R300 Dysuria: Secondary | ICD-10-CM

## 2017-09-19 DIAGNOSIS — R82998 Other abnormal findings in urine: Secondary | ICD-10-CM | POA: Diagnosis not present

## 2017-09-19 DIAGNOSIS — R350 Frequency of micturition: Secondary | ICD-10-CM | POA: Diagnosis not present

## 2017-09-19 LAB — POCT URINALYSIS DIPSTICK
Bilirubin, UA: NEGATIVE
Glucose, UA: NEGATIVE
Ketones, UA: NEGATIVE
NITRITE UA: NEGATIVE
PH UA: 6 (ref 5.0–8.0)
Protein, UA: NEGATIVE
SPEC GRAV UA: 1.015 (ref 1.010–1.025)
UROBILINOGEN UA: 0.2 U/dL

## 2017-09-19 MED ORDER — CEPHALEXIN 500 MG PO CAPS: 500.0000 mg | ORAL_CAPSULE | Freq: Two times a day (BID) | ORAL | 0 refills | Status: AC

## 2017-09-19 MED ORDER — FLUCONAZOLE 150 MG PO TABS: 150.0000 mg | ORAL_TABLET | Freq: Once | ORAL | 0 refills | Status: AC

## 2017-09-19 NOTE — Addendum Note (Signed)
Addended by: Davis Gourd on: 09/19/2017 01:57 PM   Modules accepted: Orders

## 2017-09-19 NOTE — Patient Instructions (Signed)
Follow up as needed or as scheduled Start the Keflex twice daily- take w/ food Continue to drink plenty of fluids Use either the coconut oil or Replens long acting vaginal moisturizer Call with any questions or concerns Hang In There!!!

## 2017-09-19 NOTE — Addendum Note (Signed)
Addended by: Midge Minium on: 09/19/2017 03:01 PM   Modules accepted: Orders

## 2017-09-19 NOTE — Progress Notes (Signed)
   Subjective:    Patient ID: Elizabeth [name], female    DOB: February 19, 1941, 76 y.o.   MRN: [id number]  HPI UTI- burning w/ urination started overnight when she noted very strong smelling urine.  No blood in urine.  + increased frequency.  + suprapubic pressure.  Denies CVA tenderness.  No fevers.   Review of Systems For ROS see HPI     Objective:   Physical Exam  Constitutional: She is oriented to person, place, and time. She appears well-developed and well-nourished. No distress.  Abdominal: Soft. She exhibits no distension. There is no tenderness (no suprapubic or CVA tenderness).  Neurological: She is alert and oriented to person, place, and time.  Psychiatric: She has a normal mood and affect. Her behavior is normal. Thought content normal.  Vitals reviewed.         Assessment & Plan:  UTI- pt's sxs and UA consistent w/ infxn.  Start abx.  Reviewed use of vaginal moisturizers to prevent future infxns.  Will follow.

## 2017-09-20 DIAGNOSIS — M4326 Fusion of spine, lumbar region: Secondary | ICD-10-CM | POA: Diagnosis not present

## 2017-09-22 LAB — URINE CULTURE
MICRO NUMBER: 81222825
SPECIMEN QUALITY: ADEQUATE

## 2017-09-24 DIAGNOSIS — M4326 Fusion of spine, lumbar region: Secondary | ICD-10-CM | POA: Diagnosis not present

## 2017-10-01 DIAGNOSIS — M4326 Fusion of spine, lumbar region: Secondary | ICD-10-CM | POA: Diagnosis not present

## 2017-10-08 DIAGNOSIS — M4326 Fusion of spine, lumbar region: Secondary | ICD-10-CM | POA: Diagnosis not present

## 2017-10-15 DIAGNOSIS — M4326 Fusion of spine, lumbar region: Secondary | ICD-10-CM | POA: Diagnosis not present

## 2017-10-22 DIAGNOSIS — M4326 Fusion of spine, lumbar region: Secondary | ICD-10-CM | POA: Diagnosis not present

## 2017-10-31 DIAGNOSIS — M4326 Fusion of spine, lumbar region: Secondary | ICD-10-CM | POA: Diagnosis not present

## 2017-11-01 DIAGNOSIS — R69 Illness, unspecified: Secondary | ICD-10-CM | POA: Diagnosis not present

## 2017-11-05 DIAGNOSIS — M4326 Fusion of spine, lumbar region: Secondary | ICD-10-CM | POA: Diagnosis not present

## 2017-11-08 DIAGNOSIS — M4326 Fusion of spine, lumbar region: Secondary | ICD-10-CM | POA: Diagnosis not present

## 2017-11-21 DIAGNOSIS — M4326 Fusion of spine, lumbar region: Secondary | ICD-10-CM | POA: Diagnosis not present

## 2017-11-26 DIAGNOSIS — M4326 Fusion of spine, lumbar region: Secondary | ICD-10-CM | POA: Diagnosis not present

## 2017-11-28 DIAGNOSIS — M4326 Fusion of spine, lumbar region: Secondary | ICD-10-CM | POA: Diagnosis not present

## 2017-12-03 DIAGNOSIS — M4326 Fusion of spine, lumbar region: Secondary | ICD-10-CM | POA: Diagnosis not present

## 2017-12-05 DIAGNOSIS — M4326 Fusion of spine, lumbar region: Secondary | ICD-10-CM | POA: Diagnosis not present

## 2017-12-10 DIAGNOSIS — M4326 Fusion of spine, lumbar region: Secondary | ICD-10-CM | POA: Diagnosis not present

## 2017-12-12 DIAGNOSIS — M4326 Fusion of spine, lumbar region: Secondary | ICD-10-CM | POA: Diagnosis not present

## 2017-12-17 DIAGNOSIS — M4326 Fusion of spine, lumbar region: Secondary | ICD-10-CM | POA: Diagnosis not present

## 2017-12-19 DIAGNOSIS — M4326 Fusion of spine, lumbar region: Secondary | ICD-10-CM | POA: Diagnosis not present

## 2017-12-22 ENCOUNTER — Other Ambulatory Visit (HOSPITAL_COMMUNITY)
Admit: 2017-12-22 | Discharge: 2017-12-22 | Disposition: A | Discharge status: Home / Self Care | Payer: Medicare HMO | Source: Other Acute Inpatient Hospital | Attending: Family Medicine | Admitting: Family Medicine

## 2017-12-22 ENCOUNTER — Encounter: Payer: Self-pay | Admitting: Family Medicine

## 2017-12-22 ENCOUNTER — Ambulatory Visit (INDEPENDENT_AMBULATORY_CARE_PROVIDER_SITE_OTHER): Payer: Medicare HMO | Admitting: Family Medicine

## 2017-12-22 VITALS — BP 140/82 | HR 82 | Temp 97.9°F | Wt 146.0 lb

## 2017-12-22 DIAGNOSIS — N3090 Cystitis, unspecified without hematuria: Secondary | ICD-10-CM

## 2017-12-22 DIAGNOSIS — R3915 Urgency of urination: Secondary | ICD-10-CM | POA: Diagnosis not present

## 2017-12-22 LAB — POC URINALSYSI DIPSTICK (AUTOMATED)
BILIRUBIN UA: NEGATIVE
GLUCOSE UA: NEGATIVE
KETONES UA: NEGATIVE
Nitrite, UA: NEGATIVE
Spec Grav, UA: 1.02 (ref 1.010–1.025)
Urobilinogen, UA: 0.2 E.U./dL
pH, UA: 6 (ref 5.0–8.0)

## 2017-12-22 MED ORDER — CEPHALEXIN 500 MG PO CAPS: 500.0000 mg | ORAL_CAPSULE | Freq: Two times a day (BID) | ORAL | 0 refills | Status: DC

## 2017-12-22 NOTE — Progress Notes (Signed)
Dr. Frederico Hamman T. Mariame Rybolt, MD, [facility] [place], [address] Phone: [phone] Fax: [phone]  12/22/2017  Patient: Elizabeth [name], MRN: [id number], DOB: [birth year], 77 y.o.  Primary Physician:  Midge Minium, MD   Chief Complaint  Patient presents with  . Bladder Pressure    x 1 day--denis abd pain/dysuria.Marland KitchenMarland Kitchenpt has increased water intake and OTC Tylenol  . Urinary Urgency   Subjective:   This 76 y.o. female patient presents with burning, urgency. No vaginal discharge or external irritation.  No STD exposure. No abd pain, no flank pain.  2 prior UTI's in the last year - resistance patterns reviewed.  The PMH, PSH, Social History, Family History, Medications, and allergies have been reviewed in [facility], and have been updated if relevant.  Patient Active Problem List   Diagnosis Date Noted  . Malignant neoplasm of lower-outer quadrant of left breast of female, estrogen receptor positive ([place]) 04/03/2017  . Hyperlipidemia 12/23/2015  . Lumbar back pain 12/23/2015  . Routine general medical examination at a health care facility 10/08/2013  . Chronic back pain 12/26/2012  . Blood glucose elevated 12/26/2012  . Post-menopause on HRT (hormone replacement therapy) 12/26/2012    Past Medical History:  Diagnosis Date  . Arthritis   . Cancer ([place])    pre cancer   . Chicken pox   . History of radiation therapy 06/26/17- 07/24/17   Left Breast 50.05 Gy total  . Shingles 07-2002  . Sleep apnea    mild uses oral devise  . Urinary tract infection     Past Surgical History:  Procedure Laterality Date  . BREAST LUMPECTOMY WITH RADIOACTIVE SEED LOCALIZATION Left 05/03/2017   Procedure: LEFT BREAST LUMPECTOMY WITH RADIOACTIVE SEED LOCALIZATION;  Surgeon: Jovita Kussmaul, MD;  Location: [facility];  Service: General;  Laterality: Left;  . COLONOSCOPY    . HYSTEROSCOPY     Dr Ubaldo Glassing  . SPINAL FUSION  12/01/2016   T10 to  Pelvis  . WISDOM TOOTH EXTRACTION     age 89's    Social History   Socioeconomic History  . Marital status: Married    Spouse name: Not on file  . Number of children: Not on file  . Years of education: Not on file  . Highest education level: Not on file  Social Needs  . Financial resource strain: Not on file  . Food insecurity - worry: Not on file  . Food insecurity - inability: Not on file  . Transportation needs - medical: Not on file  . Transportation needs - non-medical: Not on file  Occupational History  . Not on file  Tobacco Use  . Smoking status: Former Smoker    Types: Cigarettes    Last attempt to quit: 02/18/1981    Years since quitting: 36.8  . Smokeless tobacco: Never Used  Substance and Sexual Activity  . Alcohol use: Yes    Comment: 1 beer or wine per day  . Drug use: No  . Sexual activity: Not on file  Other Topics Concern  . Not on file  Social History Narrative  . Not on file    Family History  Problem Relation Age of Onset  . Diabetes Mother   . Arthritis Father   . Cancer Maternal Aunt        liver  . Heart disease Paternal Grandfather   . Colon cancer Neg Hx   . Pancreatic cancer Neg Hx   .  Stomach cancer Neg Hx     Allergies  Allergen Reactions  . No Known Allergies     Medication list reviewed and updated in full in [place].  GEN:  no fevers, chills. GI: No n/v/d, eating normally Otherwise, ROS is as per the HPI.  Objective:   Blood pressure 140/82, pulse 82, temperature 97.9 F (36.6 C), temperature source Oral, weight 146 lb (66.2 kg), SpO2 99 %.  GEN: WDWN, A&Ox4,NAD. Non-toxic HEENT: Atraumatc, normocephalic. CV: RRR, No M/G/R PULM: CTA B, No wheezes, crackles, or rhonchi ABD: S, NT, ND, +BS, no rebound. No CVAT. No suprapubic tenderness. EXT: No c/c/e  Objective Data: Results for orders placed or performed in visit on 12/22/17  POCT Urinalysis Dipstick (Automated)  Result Value Ref Range   Color, UA yellow     Clarity, UA cloudy    Glucose, UA neg    Bilirubin, UA neg    Ketones, UA neg    Spec Grav, UA 1.020 1.010 - 1.025   Blood, UA large    pH, UA 6.0 5.0 - 8.0   Protein, UA trace    Urobilinogen, UA 0.2 0.2 or 1.0 E.U./dL   Nitrite, UA neg    Leukocytes, UA Large (3+) (A) Negative    Assessment and Plan:   Cystitis  Urinary urgency - Plan: POCT Urinalysis Dipstick (Automated), Urine Culture  Rx with ABX as below. Drink plenty of fluids and supportive care.  Follow-up: No Follow-up on file.  Meds ordered this encounter  Medications  . cephALEXin (KEFLEX) 500 MG capsule    Sig: Take 1 capsule (500 mg total) by mouth 2 (two) times daily.    Dispense:  14 capsule    Refill:  0   Orders Placed This Encounter  Procedures  . Urine Culture  . POCT Urinalysis Dipstick (Automated)    Signed,  Cissy Galbreath T. Kharlie Bring, MD   Patient's Medications  New Prescriptions   CEPHALEXIN (KEFLEX) 500 MG CAPSULE    Take 1 capsule (500 mg total) by mouth 2 (two) times daily.  Previous Medications   ACETAMINOPHEN (TYLENOL) 500 MG TABLET    Take 650 mg by mouth 3 (three) times daily as needed for moderate pain.    BIOTIN 2.5 MG CAPS    Take 2.5 mg by mouth daily.    CARBOXYMETHYLCELLUL-GLYCERIN (LUBRICATING EYE DROPS OP)    Apply 1 drop to eye daily as needed (dry eyes).   CHOLECALCIFEROL (VITAMIN D) 2000 UNITS CAPS    Take 2,000 Units by mouth daily.    FEXOFENADINE (ALLEGRA) 180 MG TABLET    Take 180 mg by mouth daily.   FLUTICASONE (FLONASE) 50 MCG/ACT NASAL SPRAY    Place 1 spray into both nostrils at bedtime.    GLUCOSAMINE-CHONDROITIN PO    Take 3 tablets by mouth daily.   LIDOCAINE 4 % PTCH    Apply 2 patches topically daily as needed (pain).   METHYLCELLULOSE, LAXATIVE, (CITRUCEL PO)    Take 2 tablets by mouth daily.   NUTRITIONAL SUPPLEMENTS (JUICE PLUS FIBRE PO)    Take by mouth. Juice plus fruit 4 tablets daily Juice plus veggie 4 tablets daily   PROBIOTIC PRODUCT (ADVANCED  PROBIOTIC 10) CAPS    Take 1 capsule by mouth daily.   TURMERIC 500 MG TABS    Take 1,000 mg by mouth daily.  Modified Medications   No medications on file  Discontinued Medications   DIAZEPAM (VALIUM) 5 MG TABLET    Take 5  mg by mouth as needed for muscle spasms.    TRAMADOL (ULTRAM) 50 MG TABLET    Take 50 mg by mouth as needed for moderate pain.

## 2017-12-24 DIAGNOSIS — M4326 Fusion of spine, lumbar region: Secondary | ICD-10-CM | POA: Diagnosis not present

## 2017-12-25 DIAGNOSIS — M4326 Fusion of spine, lumbar region: Secondary | ICD-10-CM | POA: Diagnosis not present

## 2017-12-25 LAB — URINE CULTURE: Culture: >=100000 — AB

## 2018-01-01 DIAGNOSIS — M4326 Fusion of spine, lumbar region: Secondary | ICD-10-CM | POA: Diagnosis not present

## 2018-01-02 DIAGNOSIS — M4326 Fusion of spine, lumbar region: Secondary | ICD-10-CM | POA: Diagnosis not present

## 2018-01-08 DIAGNOSIS — M4326 Fusion of spine, lumbar region: Secondary | ICD-10-CM | POA: Diagnosis not present

## 2018-01-09 DIAGNOSIS — M4326 Fusion of spine, lumbar region: Secondary | ICD-10-CM | POA: Diagnosis not present

## 2018-01-11 DIAGNOSIS — R300 Dysuria: Secondary | ICD-10-CM | POA: Diagnosis not present

## 2018-01-14 DIAGNOSIS — M4326 Fusion of spine, lumbar region: Secondary | ICD-10-CM | POA: Diagnosis not present

## 2018-01-16 DIAGNOSIS — M4326 Fusion of spine, lumbar region: Secondary | ICD-10-CM | POA: Diagnosis not present

## 2018-01-17 DIAGNOSIS — N898 Other specified noninflammatory disorders of vagina: Secondary | ICD-10-CM | POA: Diagnosis not present

## 2018-01-17 DIAGNOSIS — N9089 Other specified noninflammatory disorders of vulva and perineum: Secondary | ICD-10-CM | POA: Diagnosis not present

## 2018-01-17 DIAGNOSIS — R8290 Unspecified abnormal findings in urine: Secondary | ICD-10-CM | POA: Diagnosis not present

## 2018-01-21 DIAGNOSIS — M4325 Fusion of spine, thoracolumbar region: Secondary | ICD-10-CM | POA: Diagnosis not present

## 2018-01-21 DIAGNOSIS — M4326 Fusion of spine, lumbar region: Secondary | ICD-10-CM | POA: Diagnosis not present

## 2018-01-21 DIAGNOSIS — M5135 Other intervertebral disc degeneration, thoracolumbar region: Secondary | ICD-10-CM | POA: Diagnosis not present

## 2018-01-22 DIAGNOSIS — M4326 Fusion of spine, lumbar region: Secondary | ICD-10-CM | POA: Diagnosis not present

## 2018-01-23 DIAGNOSIS — M4326 Fusion of spine, lumbar region: Secondary | ICD-10-CM | POA: Diagnosis not present

## 2018-01-28 DIAGNOSIS — M4326 Fusion of spine, lumbar region: Secondary | ICD-10-CM | POA: Diagnosis not present

## 2018-01-30 DIAGNOSIS — M4326 Fusion of spine, lumbar region: Secondary | ICD-10-CM | POA: Diagnosis not present

## 2018-02-04 DIAGNOSIS — M4326 Fusion of spine, lumbar region: Secondary | ICD-10-CM | POA: Diagnosis not present

## 2018-02-06 DIAGNOSIS — M4326 Fusion of spine, lumbar region: Secondary | ICD-10-CM | POA: Diagnosis not present

## 2018-02-12 DIAGNOSIS — M4326 Fusion of spine, lumbar region: Secondary | ICD-10-CM | POA: Diagnosis not present

## 2018-02-13 DIAGNOSIS — M4326 Fusion of spine, lumbar region: Secondary | ICD-10-CM | POA: Diagnosis not present

## 2018-02-18 DIAGNOSIS — M4326 Fusion of spine, lumbar region: Secondary | ICD-10-CM | POA: Diagnosis not present

## 2018-02-20 DIAGNOSIS — M4326 Fusion of spine, lumbar region: Secondary | ICD-10-CM | POA: Diagnosis not present

## 2018-02-25 DIAGNOSIS — M4326 Fusion of spine, lumbar region: Secondary | ICD-10-CM | POA: Diagnosis not present

## 2018-02-25 DIAGNOSIS — D0512 Intraductal carcinoma in situ of left breast: Secondary | ICD-10-CM | POA: Diagnosis not present

## 2018-02-27 DIAGNOSIS — M4326 Fusion of spine, lumbar region: Secondary | ICD-10-CM | POA: Diagnosis not present

## 2018-03-04 DIAGNOSIS — M4326 Fusion of spine, lumbar region: Secondary | ICD-10-CM | POA: Diagnosis not present

## 2018-03-06 ENCOUNTER — Ambulatory Visit
Admission: RE | Admit: 2018-03-06 | Discharge: 2018-03-06 | Disposition: A | Discharge status: Home / Self Care | Payer: Medicare HMO | Source: Ambulatory Visit | Attending: Adult Health | Admitting: Adult Health

## 2018-03-06 DIAGNOSIS — Z170 Estrogen receptor positive status [ER+]: Principal | ICD-10-CM

## 2018-03-06 DIAGNOSIS — R922 Inconclusive mammogram: Secondary | ICD-10-CM | POA: Diagnosis not present

## 2018-03-06 DIAGNOSIS — C50512 Malignant neoplasm of lower-outer quadrant of left female breast: Secondary | ICD-10-CM

## 2018-03-06 DIAGNOSIS — M4326 Fusion of spine, lumbar region: Secondary | ICD-10-CM | POA: Diagnosis not present

## 2018-03-12 DIAGNOSIS — M4326 Fusion of spine, lumbar region: Secondary | ICD-10-CM | POA: Diagnosis not present

## 2018-03-13 DIAGNOSIS — M4326 Fusion of spine, lumbar region: Secondary | ICD-10-CM | POA: Diagnosis not present

## 2018-03-14 DIAGNOSIS — M4326 Fusion of spine, lumbar region: Secondary | ICD-10-CM | POA: Diagnosis not present

## 2018-03-20 NOTE — Progress Notes (Addendum)
Subjective:   Elizabeth [name] is a 77 y.o. female who presents for Medicare Annual (Subsequent) preventive examination.  Review of Systems:  No ROS.  Medicare Wellness Visit. Additional risk factors are reflected in the social history.  Cardiac Risk Factors include: advanced age (>75men, >5 women);family history of premature cardiovascular disease   Sleep patterns: Sleeps 6-7 hours. Up to void x 1-2.  Home Safety/Smoke Alarms: Feels safe in home. Smoke alarms in place.  Living environment; residence and Firearm Safety: Lives with husband in 2 story home, rails at steps. Bedroom first floor.  Seat Belt Safety/Bike Helmet: Wears seat belt.   Female:   Pap-02/25/2015       Mammo-03/06/2018. BI-RADS CATEGORY  2: Benign.      Dexa scan-10/26/2016, Osteoporosis       CCS-Colonoscopy 01/05/2014, Diverticulosis. Recall 10 years.       Objective:     Vitals: BP 122/72 (BP Location: Left Arm, Patient Position: Sitting, Cuff Size: Normal)   Pulse 77   Temp 98.1 F (36.7 C) (Temporal)   Resp 16   Ht 5' 7.5" (1.715 m)   Wt 145 lb 8 oz (66 kg)   SpO2 100%   BMI 22.45 kg/m   Body mass index is 22.45 kg/m.  Advanced Directives 03/21/2018 08/24/2017 07/19/2017 05/11/2017 04/30/2017 04/04/2017 03/15/2017  Does Patient Have a Medical Advance Directive? Yes Yes Yes Yes Yes No Yes  Type of Paramedic of [place];Living will Living will;Healthcare Power of [place];Living will [place];Living will - [place];Living will  Does patient want to make changes to medical advance directive? - - - - No - Patient declined - -  Copy of [place] in Chart? Yes No - copy requested - Yes No - copy requested - No - copy requested    Tobacco Social History   Tobacco Use  Smoking Status Former Smoker  . Types: Cigarettes  . Last attempt to quit: 02/18/1981  . Years since quitting: 37.1    Smokeless Tobacco Never Used     Counseling given: Not Answered    Past Medical History:  Diagnosis Date  . Arthritis   . Cancer ([place])    pre cancer   . Chicken pox   . History of radiation therapy 06/26/17- 07/24/17   Left Breast 50.05 Gy total  . Shingles 07-2002  . Sleep apnea    mild uses oral devise  . Urinary tract infection    Past Surgical History:  Procedure Laterality Date  . BREAST BIOPSY Bilateral 03/07/2017   benign  . BREAST BIOPSY Right 03/26/2017   benign  . BREAST BIOPSY Left 03/26/2017   malignant  . BREAST LUMPECTOMY Left 05/03/2017  . BREAST LUMPECTOMY WITH RADIOACTIVE SEED LOCALIZATION Left 05/03/2017   Procedure: LEFT BREAST LUMPECTOMY WITH RADIOACTIVE SEED LOCALIZATION;  Surgeon: Jovita Kussmaul, MD;  Location: [facility];  Service: General;  Laterality: Left;  . COLONOSCOPY    . HYSTEROSCOPY     Dr Ubaldo Glassing  . SPINAL FUSION  12/01/2016   T10 to Pelvis  . WISDOM TOOTH EXTRACTION     age 3's   Family History  Problem Relation Age of Onset  . Diabetes Mother   . Arthritis Father   . Cancer Maternal Aunt        liver  . Heart disease Paternal Grandfather   . Colon cancer Neg Hx   . Pancreatic cancer Neg Hx   .  Stomach cancer Neg Hx    Social History   Socioeconomic History  . Marital status: Married    Spouse name: Not on file  . Number of children: Not on file  . Years of education: Not on file  . Highest education level: Not on file  Occupational History  . Not on file  Social Needs  . Financial resource strain: Not on file  . Food insecurity:    Worry: Not on file    Inability: Not on file  . Transportation needs:    Medical: Not on file    Non-medical: Not on file  Tobacco Use  . Smoking status: Former Smoker    Types: Cigarettes    Last attempt to quit: 02/18/1981    Years since quitting: 37.1  . Smokeless tobacco: Never Used  Substance and Sexual Activity  . Alcohol use: Yes    Alcohol/week: 1.2 oz    Types: 2 Glasses of wine per  week    Comment: 2 glasses 4 days/week  . Drug use: No  . Sexual activity: Not on file  Lifestyle  . Physical activity:    Days per week: Not on file    Minutes per session: Not on file  . Stress: Not on file  Relationships  . Social connections:    Talks on phone: Not on file    Gets together: Not on file    Attends religious service: Not on file    Active member of club or organization: Not on file    Attends meetings of clubs or organizations: Not on file    Relationship status: Not on file  Other Topics Concern  . Not on file  Social History Narrative  . Not on file    Outpatient Encounter Medications as of 03/21/2018  Medication Sig  . acetaminophen (TYLENOL) 500 MG tablet Take 1,000 mg by mouth 3 (three) times daily as needed for moderate pain.   . Biotin 2.5 MG CAPS Take 2.5 mg by mouth daily.   . Carboxymethylcellul-Glycerin (LUBRICATING EYE DROPS OP) Apply 1 drop to eye daily as needed (dry eyes).  . cetirizine (ZYRTEC) 10 MG tablet Take 10 mg by mouth daily.  . Cholecalciferol (VITAMIN D) 2000 UNITS CAPS Take 2,000 Units by mouth daily.   . fluticasone (FLONASE) 50 MCG/ACT nasal spray Place 1 spray into both nostrils at bedtime.   Marland Kitchen GLUCOSAMINE-CHONDROITIN PO Take 3 tablets by mouth daily.  . Lidocaine 4 % PTCH Apply 2 patches topically daily as needed (pain).  . Methylcellulose, Laxative, (CITRUCEL PO) Take 2 tablets by mouth daily.  . Nutritional Supplements (JUICE PLUS FIBRE PO) Take by mouth. Juice plus fruit 4 tablets daily Juice plus veggie 4 tablets daily  . Probiotic Product (ADVANCED PROBIOTIC 10) CAPS Take 1 capsule by mouth daily.  . Turmeric 500 MG TABS Take 1,000 mg by mouth daily.  Marland Kitchen VITAMIN B COMPLEX-C CAPS   . [DISCONTINUED] fexofenadine (ALLEGRA) 180 MG tablet Take 180 mg by mouth daily.  . traMADol (ULTRAM) 50 MG tablet tramadol 50 mg tablet  TAKE 1 TABLET BY MOUTH EVERY 6 HOURS AS NEEDED FOR PAIN  . Zoster Vaccine Adjuvanted [facility]) injection  Inject 0.5 mLs into the muscle once for 1 dose.  . [DISCONTINUED] cephALEXin (KEFLEX) 500 MG capsule Take 1 capsule (500 mg total) by mouth 2 (two) times daily.   No facility-administered encounter medications on file as of 03/21/2018.     Activities of Daily Living In your present state of  health, do you have any difficulty performing the following activities: 03/21/2018 09/19/2017  Hearing? N N  Vision? N N  Difficulty concentrating or making decisions? N N  Walking or climbing stairs? N N  Dressing or bathing? N N  Doing errands, shopping? N N  Preparing Food and eating ? N -  Using the Toilet? N -  In the past six months, have you accidently leaked urine? N -  Do you have problems with loss of bowel control? N -  Managing your Medications? N -  Managing your Finances? N -  Housekeeping or managing your Housekeeping? N -  Some recent data might be hidden    Patient Care Team: Midge Minium, MD as PCP - General (Family Medicine) Paula Compton, MD as Consulting Physician (Obstetrics and Gynecology) Gloris Manchester, MD (Neurosurgery) Regal, Tamala Fothergill, DPM as Consulting Physician (Podiatry) Marica Otter, OD (Optometry) Jovita Kussmaul, MD as Consulting Physician (General Surgery) Nicholas Lose, MD as Consulting Physician (Hematology and Oncology) Eppie Gibson, MD as Attending Physician (Radiation Oncology) Gardenia Phlegm, NP as Nurse Practitioner (Hematology and Oncology)    Assessment:   This is a routine wellness examination for Michie.  Exercise Activities and Dietary recommendations Current Exercise Habits: Home exercise routine, Type of exercise: yoga;walking;stretching(elliptical ), Time (Minutes): 20, Frequency (Times/Week): 7, Weekly Exercise (Minutes/Week): 140, Exercise limited by: None identified   Diet (meal preparation, eat out, water intake, caffeinated beverages, dairy products, fruits and vegetables): Drinks water, coffee and tea.   Eats heart  healthy diet. 3 meals/day.   Encouraged to continue heart healthy diet and exercise.   Goals    . Patient Stated     Increase walking after back pain controlled.        Fall Risk Fall Risk  03/21/2018 09/19/2017 08/24/2017 05/11/2017 03/15/2017  Falls in the past year? No No No No No    Depression Screen PHQ 2/9 Scores 03/21/2018 09/19/2017 08/24/2017 05/11/2017  PHQ - 2 Score 0 0 0 0  PHQ- 9 Score - 0 - -  Exception Documentation - - - -     Cognitive Function MMSE - Mini Mental State Exam 03/21/2018  Orientation to time 5  Orientation to Place 5  Registration 3  Attention/ Calculation 3  Recall 3  Language- name 2 objects 2  Language- repeat 1  Language- follow 3 step command 3  Language- read & follow direction 1  Write a sentence 1  Copy design 1  Total score 28        Immunization History  Administered Date(s) Administered  . Influenza, High Dose Seasonal PF 09/12/2013, 09/24/2015, 08/24/2016  . Influenza,inj,Quad PF,6+ Mos 08/24/2014, 07/30/2017  . Influenza-Unspecified 09/12/2013  . Pneumococcal Conjugate-13 07/22/2008, 12/17/2014  . Pneumococcal Polysaccharide-23 10/06/2016  . Tetanus 10/08/2013  . Zoster 07/22/2008     Screening Tests Health Maintenance  Topic Date Due  . INFLUENZA VACCINE  06/20/2018  . TETANUS/TDAP  10/09/2023  . COLONOSCOPY  01/06/2024  . DEXA SCAN  Completed  . PNA vac Low Risk Adult  Completed        Plan:    Shingles vaccine at pharmacy.   Continue doing brain stimulating activities (puzzles, reading, adult coloring books, staying active) to keep memory sharp.   Continue to eat heart healthy diet (full of fruits, vegetables, whole grains, lean protein, water--limit salt, fat, and sugar intake) and increase physical activity as tolerated.  I have personally reviewed and noted the following in the patient's chart:   .  Medical and social history . Use of alcohol, tobacco or illicit drugs  . Current medications and  supplements . Functional ability and status . Nutritional status . Physical activity . Advanced directives . List of other physicians . Hospitalizations, surgeries, and ER visits in previous 12 months . Vitals . Screenings to include cognitive, depression, and falls . Referrals and appointments  In addition, I have reviewed and discussed with patient certain preventive protocols, quality metrics, and best practice recommendations. A written personalized care plan for preventive services as well as general preventive health recommendations were provided to patient.     Gerilyn Nestle, RN  03/21/2018   Reviewed documentation provided by RN and agree w/ above.  Annye Asa, MD

## 2018-03-21 ENCOUNTER — Ambulatory Visit (INDEPENDENT_AMBULATORY_CARE_PROVIDER_SITE_OTHER): Payer: Medicare HMO | Admitting: Family Medicine

## 2018-03-21 ENCOUNTER — Encounter: Payer: Self-pay | Admitting: Family Medicine

## 2018-03-21 ENCOUNTER — Telehealth: Payer: Self-pay | Admitting: Family Medicine

## 2018-03-21 ENCOUNTER — Ambulatory Visit (INDEPENDENT_AMBULATORY_CARE_PROVIDER_SITE_OTHER): Payer: Medicare HMO

## 2018-03-21 ENCOUNTER — Other Ambulatory Visit: Payer: Self-pay

## 2018-03-21 VITALS — BP 122/72 | HR 77 | Temp 98.1°F | Resp 16 | Ht 67.5 in | Wt 145.5 lb

## 2018-03-21 DIAGNOSIS — Z0000 Encounter for general adult medical examination without abnormal findings: Secondary | ICD-10-CM | POA: Diagnosis not present

## 2018-03-21 DIAGNOSIS — Z23 Encounter for immunization: Secondary | ICD-10-CM | POA: Diagnosis not present

## 2018-03-21 DIAGNOSIS — M4326 Fusion of spine, lumbar region: Secondary | ICD-10-CM | POA: Diagnosis not present

## 2018-03-21 DIAGNOSIS — E785 Hyperlipidemia, unspecified: Secondary | ICD-10-CM | POA: Diagnosis not present

## 2018-03-21 LAB — LIPID PANEL
CHOLESTEROL: 223 mg/dL — AB (ref 0–200)
HDL: 88.2 mg/dL (ref >39.00)
LDL Cholesterol: 108 mg/dL — ABNORMAL HIGH (ref 0–99)
NonHDL: 134.32
TRIGLYCERIDES: 130 mg/dL (ref 0.0–149.0)
Total CHOL/HDL Ratio: 3
VLDL: 26 mg/dL (ref 0.0–40.0)

## 2018-03-21 LAB — BASIC METABOLIC PANEL
BUN: 10 mg/dL (ref 6–23)
CO2: 28 mEq/L (ref 19–32)
CREATININE: 0.75 mg/dL (ref 0.40–1.20)
Calcium: 10.4 mg/dL (ref 8.4–10.5)
Chloride: 101 mEq/L (ref 96–112)
GFR: 80.01 mL/min (ref >60.00)
GLUCOSE: 108 mg/dL — AB (ref 70–99)
POTASSIUM: 4.9 meq/L (ref 3.5–5.1)
Sodium: 135 mEq/L (ref 135–145)

## 2018-03-21 LAB — CBC WITH DIFFERENTIAL/PLATELET
BASOS ABS: 0.1 10*3/uL (ref 0.0–0.1)
Basophils Relative: 1 % (ref 0.0–3.0)
Eosinophils Absolute: 0 10*3/uL (ref 0.0–0.7)
Eosinophils Relative: 0.8 % (ref 0.0–5.0)
HCT: 38.1 % (ref 36.0–46.0)
HEMOGLOBIN: 13.2 g/dL (ref 12.0–15.0)
Lymphocytes Relative: 13.4 % (ref 12.0–46.0)
Lymphs Abs: 0.7 10*3/uL (ref 0.7–4.0)
MCHC: 34.6 g/dL (ref 30.0–36.0)
MCV: 98 fl (ref 78.0–100.0)
MONO ABS: 0.5 10*3/uL (ref 0.1–1.0)
Monocytes Relative: 9.2 % (ref 3.0–12.0)
NEUTROS PCT: 75.6 % (ref 43.0–77.0)
Neutro Abs: 3.9 10*3/uL (ref 1.4–7.7)
Platelets: 337 10*3/uL (ref 150.0–400.0)
RBC: 3.89 Mil/uL (ref 3.87–5.11)
RDW: 12.5 % (ref 11.5–15.5)
WBC: 5.1 10*3/uL (ref 4.0–10.5)

## 2018-03-21 LAB — HEPATIC FUNCTION PANEL
ALBUMIN: 4.7 g/dL (ref 3.5–5.2)
ALK PHOS: 70 U/L (ref 39–117)
ALT: 11 U/L (ref 0–35)
AST: 16 U/L (ref 0–37)
Bilirubin, Direct: 0.1 mg/dL (ref 0.0–0.3)
TOTAL PROTEIN: 8 g/dL (ref 6.0–8.3)
Total Bilirubin: 0.6 mg/dL (ref 0.2–1.2)

## 2018-03-21 LAB — TSH: TSH: 2.36 u[IU]/mL (ref 0.35–4.50)

## 2018-03-21 MED ORDER — DICLOFENAC SODIUM 1 % TD GEL: 4.0000 g | Freq: Four times a day (QID) | TRANSDERMAL | 1 refills | Status: DC

## 2018-03-21 MED ORDER — MOMETASONE FUROATE 0.1 % EX CREA: 1.0000 "application " | TOPICAL_CREAM | Freq: Every day | CUTANEOUS | 0 refills | Status: DC

## 2018-03-21 MED ORDER — ZOSTER VAC RECOMB ADJUVANTED 50 MCG/0.5ML IM SUSR: 0.5000 mL | Freq: Once | INTRAMUSCULAR | 1 refills | Status: AC

## 2018-03-21 MED ORDER — CEPHALEXIN 500 MG PO CAPS: 500.0000 mg | ORAL_CAPSULE | Freq: Two times a day (BID) | ORAL | 0 refills | Status: AC

## 2018-03-21 NOTE — Assessment & Plan Note (Signed)
Chronic problem.  Tolerating statin w/o difficulty.  Check labs.  Adjust meds prn  

## 2018-03-21 NOTE — Telephone Encounter (Signed)
Paperwork given to PCP for completion.  

## 2018-03-21 NOTE — Patient Instructions (Addendum)
Shingles vaccine at pharmacy.   Continue doing brain stimulating activities (puzzles, reading, adult coloring books, staying active) to keep memory sharp.   Continue to eat heart healthy diet (full of fruits, vegetables, whole grains, lean protein, water--limit salt, fat, and sugar intake) and increase physical activity as tolerated.  Health Maintenance, Female Adopting a healthy lifestyle and getting preventive care can go a long way to promote health and wellness. Talk with your health care provider about what schedule of regular examinations is right for you. This is a good chance for you to check in with your provider about disease prevention and staying healthy. In between checkups, there are plenty of things you can do on your own. Experts have done a lot of research about which lifestyle changes and preventive measures are most likely to keep you healthy. Ask your health care provider for more information. Weight and diet Eat a healthy diet  Be sure to include plenty of vegetables, fruits, low-fat dairy products, and lean protein.  Do not eat a lot of foods high in solid fats, added sugars, or salt.  Get regular exercise. This is one of the most important things you can do for your health. ? Most adults should exercise for at least 150 minutes each week. The exercise should increase your heart rate and make you sweat (moderate-intensity exercise). ? Most adults should also do strengthening exercises at least twice a week. This is in addition to the moderate-intensity exercise.  Maintain a healthy weight  Body mass index (BMI) is a measurement that can be used to identify possible weight problems. It estimates body fat based on height and weight. Your health care provider can help determine your BMI and help you achieve or maintain a healthy weight.  For females 67 years of age and older: ? A BMI below 18.5 is considered underweight. ? A BMI of 18.5 to 24.9 is normal. ? A BMI of 25  to 29.9 is considered overweight. ? A BMI of 30 and above is considered obese.  Watch levels of cholesterol and blood lipids  You should start having your blood tested for lipids and cholesterol at 75 years of age, then have this test every 5 years.  You may need to have your cholesterol levels checked more often if: ? Your lipid or cholesterol levels are high. ? You are older than 75 years of age. ? You are at high risk for heart disease.  Cancer screening Lung Cancer  Lung cancer screening is recommended for adults 37-44 years old who are at high risk for lung cancer because of a history of smoking.  A yearly low-dose CT scan of the lungs is recommended for people who: ? Currently smoke. ? Have quit within the past 15 years. ? Have at least a 30-pack-year history of smoking. A pack year is smoking an average of one pack of cigarettes a day for 1 year.  Yearly screening should continue until it has been 15 years since you quit.  Yearly screening should stop if you develop a health problem that would prevent you from having lung cancer treatment.  Breast Cancer  Practice breast self-awareness. This means understanding how your breasts normally appear and feel.  It also means doing regular breast self-exams. Let your health care provider know about any changes, no matter how small.  If you are in your 20s or 30s, you should have a clinical breast exam (CBE) by a health care provider every 1-3 years as part of  a regular health exam.  If you are 74 or older, have a CBE every year. Also consider having a breast X-ray (mammogram) every year.  If you have a family history of breast cancer, talk to your health care provider about genetic screening.  If you are at high risk for breast cancer, talk to your health care provider about having an MRI and a mammogram every year.  Breast cancer gene (BRCA) assessment is recommended for women who have family members with BRCA-related cancers.  BRCA-related cancers include: ? Breast. ? Ovarian. ? Tubal. ? Peritoneal cancers.  Results of the assessment will determine the need for genetic counseling and BRCA1 and BRCA2 testing.  Cervical Cancer Your health care provider may recommend that you be screened regularly for cancer of the pelvic organs (ovaries, uterus, and vagina). This screening involves a pelvic examination, including checking for microscopic changes to the surface of your cervix (Pap test). You may be encouraged to have this screening done every 3 years, beginning at age 60.  For women ages 8-65, health care providers may recommend pelvic exams and Pap testing every 3 years, or they may recommend the Pap and pelvic exam, combined with testing for human papilloma virus (HPV), every 5 years. Some types of HPV increase your risk of cervical cancer. Testing for HPV may also be done on women of any age with unclear Pap test results.  Other health care providers may not recommend any screening for nonpregnant women who are considered low risk for pelvic cancer and who do not have symptoms. Ask your health care provider if a screening pelvic exam is right for you.  If you have had past treatment for cervical cancer or a condition that could lead to cancer, you need Pap tests and screening for cancer for at least 20 years after your treatment. If Pap tests have been discontinued, your risk factors (such as having a new sexual partner) need to be reassessed to determine if screening should resume. Some women have medical problems that increase the chance of getting cervical cancer. In these cases, your health care provider may recommend more frequent screening and Pap tests.  Colorectal Cancer  This type of cancer can be detected and often prevented.  Routine colorectal cancer screening usually begins at 75 years of age and continues through 75 years of age.  Your health care provider may recommend screening at an earlier age if  you have risk factors for colon cancer.  Your health care provider may also recommend using home test kits to check for hidden blood in the stool.  A small camera at the end of a tube can be used to examine your colon directly (sigmoidoscopy or colonoscopy). This is done to check for the earliest forms of colorectal cancer.  Routine screening usually begins at age 24.  Direct examination of the colon should be repeated every 5-10 years through 75 years of age. However, you may need to be screened more often if early forms of precancerous polyps or small growths are found.  Skin Cancer  Check your skin from head to toe regularly.  Tell your health care provider about any new moles or changes in moles, especially if there is a change in a mole's shape or color.  Also tell your health care provider if you have a mole that is larger than the size of a pencil eraser.  Always use sunscreen. Apply sunscreen liberally and repeatedly throughout the day.  Protect yourself by wearing long sleeves,  pants, a wide-brimmed hat, and sunglasses whenever you are outside.  Heart disease, diabetes, and high blood pressure  High blood pressure causes heart disease and increases the risk of stroke. High blood pressure is more likely to develop in: ? People who have blood pressure in the high end of the normal range (130-139/85-89 mm Hg). ? People who are overweight or obese. ? People who are African American.  If you are 19-47 years of age, have your blood pressure checked every 3-5 years. If you are 57 years of age or older, have your blood pressure checked every year. You should have your blood pressure measured twice-once when you are at a hospital or clinic, and once when you are not at a hospital or clinic. Record the average of the two measurements. To check your blood pressure when you are not at a hospital or clinic, you can use: ? An automated blood pressure machine at a pharmacy. ? A home blood  pressure monitor.  If you are between 26 years and 5 years old, ask your health care provider if you should take aspirin to prevent strokes.  Have regular diabetes screenings. This involves taking a blood sample to check your fasting blood sugar level. ? If you are at a normal weight and have a low risk for diabetes, have this test once every three years after 75 years of age. ? If you are overweight and have a high risk for diabetes, consider being tested at a younger age or more often. Preventing infection Hepatitis B  If you have a higher risk for hepatitis B, you should be screened for this virus. You are considered at high risk for hepatitis B if: ? You were born in a country where hepatitis B is common. Ask your health care provider which countries are considered high risk. ? Your parents were born in a high-risk country, and you have not been immunized against hepatitis B (hepatitis B vaccine). ? You have HIV or AIDS. ? You use needles to inject street drugs. ? You live with someone who has hepatitis B. ? You have had sex with someone who has hepatitis B. ? You get hemodialysis treatment. ? You take certain medicines for conditions, including cancer, organ transplantation, and autoimmune conditions.  Hepatitis C  Blood testing is recommended for: ? Everyone born from [birth year] through 1965. ? Anyone with known risk factors for hepatitis C.  Sexually transmitted infections (STIs)  You should be screened for sexually transmitted infections (STIs) including gonorrhea and chlamydia if: ? You are sexually active and are younger than 75 years of age. ? You are older than 75 years of age and your health care provider tells you that you are at risk for this type of infection. ? Your sexual activity has changed since you were last screened and you are at an increased risk for chlamydia or gonorrhea. Ask your health care provider if you are at risk.  If you do not have HIV, but are at risk,  it may be recommended that you take a prescription medicine daily to prevent HIV infection. This is called pre-exposure prophylaxis (PrEP). You are considered at risk if: ? You are sexually active and do not regularly use condoms or know the HIV status of your partner(s). ? You take drugs by injection. ? You are sexually active with a partner who has HIV.  Talk with your health care provider about whether you are at high risk of being infected with HIV. If you  choose to begin PrEP, you should first be tested for HIV. You should then be tested every 3 months for as long as you are taking PrEP. Pregnancy  If you are premenopausal and you may become pregnant, ask your health care provider about preconception counseling.  If you may become pregnant, take 400 to 800 micrograms (mcg) of folic acid every day.  If you want to prevent pregnancy, talk to your health care provider about birth control (contraception). Osteoporosis and menopause  Osteoporosis is a disease in which the bones lose minerals and strength with aging. This can result in serious bone fractures. Your risk for osteoporosis can be identified using a bone density scan.  If you are 4 years of age or older, or if you are at risk for osteoporosis and fractures, ask your health care provider if you should be screened.  Ask your health care provider whether you should take a calcium or vitamin D supplement to lower your risk for osteoporosis.  Menopause may have certain physical symptoms and risks.  Hormone replacement therapy may reduce some of these symptoms and risks. Talk to your health care provider about whether hormone replacement therapy is right for you. Follow these instructions at home:  Schedule regular health, dental, and eye exams.  Stay current with your immunizations.  Do not use any tobacco products including cigarettes, chewing tobacco, or electronic cigarettes.  If you are pregnant, do not drink  alcohol.  If you are breastfeeding, limit how much and how often you drink alcohol.  Limit alcohol intake to no more than 1 drink per day for nonpregnant women. One drink equals 12 ounces of beer, 5 ounces of wine, or 1 ounces of hard liquor.  Do not use street drugs.  Do not share needles.  Ask your health care provider for help if you need support or information about quitting drugs.  Tell your health care provider if you often feel depressed.  Tell your health care provider if you have ever been abused or do not feel safe at home. This information is not intended to replace advice given to you by your health care provider. Make sure you discuss any questions you have with your health care provider. Document Released: 05/22/2011 Document Revised: 04/13/2016 Document Reviewed: 08/10/2015 Elsevier Interactive Patient Education  [employer].

## 2018-03-21 NOTE — Telephone Encounter (Signed)
Received [employer] paperwork "Request for Additional Information-Medicare Member". Requesting forms to be completed and faxed back. Placed in front bin with charge sheet.

## 2018-03-21 NOTE — Assessment & Plan Note (Signed)
Pt's PE WNL.  UTD on colonoscopy, mammo, DEXA and immunizations.  Check labs.  Anticipatory guidance provided.

## 2018-03-21 NOTE — Patient Instructions (Signed)
Follow up in 6 months to recheck cholesterol We'll notify you of your lab results and make any changes if needed Keep up the good work on healthy diet and regular exercise- you look great! Use the Keflex only if symptoms of a UTI Use the Voltaren gel as needed for back pain Call with any questions or concerns Have a WONDERFUL trip!!

## 2018-03-21 NOTE — Progress Notes (Signed)
   Subjective:    Patient ID: Elizabeth [name], female    DOB: 1941-07-08, 77 y.o.   MRN: [id number]  HPI CPE- UTD on colonoscopy, mammo, DEXA, immunizations.   Review of Systems Patient reports no vision/ hearing changes, adenopathy,fever, weight change,  persistant/recurrent hoarseness , swallowing issues, chest pain, palpitations, edema, persistant/recurrent cough, hemoptysis, dyspnea (rest/exertional/paroxysmal nocturnal), gastrointestinal bleeding (melena, rectal bleeding), abdominal pain, significant heartburn, bowel changes, GU symptoms (dysuria, hematuria, incontinence), Gyn symptoms (abnormal  bleeding, pain),  syncope, focal weakness, memory loss, numbness & tingling, skin/hair/nail changes, abnormal bruising or bleeding, anxiety, or depression.     Objective:   Physical Exam General Appearance:    Alert, cooperative, no distress, appears stated age  Head:    Normocephalic, without obvious abnormality, atraumatic  Eyes:    PERRL, conjunctiva/corneas clear, EOM's intact, fundi    benign, both eyes  Ears:    Normal TM's and external ear canals, both ears  Nose:   Nares normal, septum midline, mucosa normal, no drainage    or sinus tenderness  Throat:   Lips, mucosa, and tongue normal; teeth and gums normal  Neck:   Supple, symmetrical, trachea midline, no adenopathy;    Thyroid: no enlargement/tenderness/nodules  Back:     Symmetric, no curvature, ROM normal, no CVA tenderness  Lungs:     Clear to auscultation bilaterally, respirations unlabored  Chest Wall:    No tenderness or deformity   Heart:    Regular rate and rhythm, S1 and S2 normal, no murmur, rub   or gallop  Breast Exam:    Deferred to GYN  Abdomen:     Soft, non-tender, bowel sounds active all four quadrants,    no masses, no organomegaly  Genitalia:    Deferred to GYN  Rectal:    Extremities:   Extremities normal, atraumatic, no cyanosis or edema  Pulses:   2+ and symmetric all extremities  Skin:   Skin  color, texture, turgor normal, no rashes or lesions  Lymph nodes:   Cervical, supraclavicular, and axillary nodes normal  Neurologic:   CNII-XII intact, normal strength, sensation and reflexes    throughout          Assessment & Plan:

## 2018-03-22 ENCOUNTER — Encounter: Payer: Self-pay | Admitting: General Practice

## 2018-03-22 ENCOUNTER — Telehealth: Payer: Self-pay | Admitting: Family Medicine

## 2018-03-22 DIAGNOSIS — M4326 Fusion of spine, lumbar region: Secondary | ICD-10-CM | POA: Diagnosis not present

## 2018-03-22 NOTE — Telephone Encounter (Signed)
Form completed and placed in CMA basket to fax

## 2018-03-22 NOTE — Telephone Encounter (Signed)
Paper work completed.

## 2018-03-22 NOTE — Telephone Encounter (Signed)
Completed paperwork faxed back as requested.

## 2018-03-22 NOTE — Telephone Encounter (Signed)
Received paperwork from [employer], "Request for Additional Information-Medicare Member", via fax. Requesting that the forms be completed and faxed back. Placed in front bin with charge sheet.

## 2018-03-22 NOTE — Telephone Encounter (Signed)
Paperwork given to PCP for completion.  

## 2018-03-25 DIAGNOSIS — M4326 Fusion of spine, lumbar region: Secondary | ICD-10-CM | POA: Diagnosis not present

## 2018-03-26 DIAGNOSIS — Z01419 Encounter for gynecological examination (general) (routine) without abnormal findings: Secondary | ICD-10-CM | POA: Diagnosis not present

## 2018-03-26 DIAGNOSIS — Z6822 Body mass index (BMI) 22.0-22.9, adult: Secondary | ICD-10-CM | POA: Diagnosis not present

## 2018-03-26 DIAGNOSIS — Z124 Encounter for screening for malignant neoplasm of cervix: Secondary | ICD-10-CM | POA: Diagnosis not present

## 2018-03-26 LAB — HM PAP SMEAR

## 2018-03-27 ENCOUNTER — Encounter: Payer: Self-pay | Admitting: General Practice

## 2018-03-27 DIAGNOSIS — M4326 Fusion of spine, lumbar region: Secondary | ICD-10-CM | POA: Diagnosis not present

## 2018-03-28 DIAGNOSIS — M4326 Fusion of spine, lumbar region: Secondary | ICD-10-CM | POA: Diagnosis not present

## 2018-04-01 DIAGNOSIS — M4326 Fusion of spine, lumbar region: Secondary | ICD-10-CM | POA: Diagnosis not present

## 2018-04-02 ENCOUNTER — Other Ambulatory Visit: Payer: Self-pay

## 2018-04-02 ENCOUNTER — Encounter: Payer: Self-pay | Admitting: Family Medicine

## 2018-04-02 ENCOUNTER — Ambulatory Visit (INDEPENDENT_AMBULATORY_CARE_PROVIDER_SITE_OTHER): Payer: Medicare HMO | Admitting: Family Medicine

## 2018-04-02 VITALS — BP 120/72 | HR 85 | Temp 97.8°F | Resp 16 | Ht 67.5 in | Wt 144.0 lb

## 2018-04-02 DIAGNOSIS — L720 Epidermal cyst: Secondary | ICD-10-CM

## 2018-04-02 NOTE — Patient Instructions (Addendum)
Follow up as needed or as scheduled This is a small grouping of inclusion cysts (which form at the base of the hair follicles) and likely triggered by use of the wet wipes Wipe with regular toilet paper- avoid paper towels and wet wipes Apply antibacterial ointment twice daily to the sore area I was able to get multiple cysts to express their contents so that should feel better Call with any questions or concerns Hang in there! Enjoy your trip!!!

## 2018-04-02 NOTE — Progress Notes (Signed)
   Subjective:    Patient ID: Elizabeth [name], female    DOB: 1941/01/09, 77 y.o.   MRN: [id number]  HPI Rectal lump- sxs started ~1 week ago.  'it just felt like an irritation'.  Has been using wet, flushable wipes and thought that may have been the cause.  She began applying triamcinolone cream but then felt an external nodule on Friday or Saturday.  Area is 'tender and feels a little raw'.  'not constantly painful'.  No bleeding.  No pain w/ BM.  No fevers.   Review of Systems For ROS see HPI     Objective:   Physical Exam  Constitutional: She appears well-developed and well-nourished. No distress.  Skin: Skin is warm and dry. No erythema.  2 cm inclusion cyst to R of anus w/ mild TTP- moderate sebum expressed w/ gentle pressure.  Multiple surrounding <1 cm inclusion cysts in the perianal region bilaterally w/ sebum expressed w/ gentle pressure.  No evidence of infxn or pilonidal cyst  Vitals reviewed.         Assessment & Plan:  Inclusion cysts- multiple in the perianal area w/ 1 that is larger than the others and this is the area that is TTP.  Sebum expressed from multiple cysts.  No need for oral abx at this time.  Topical bacitracin.  Encouraged her to use regular toilet tissue vs wet wipes that are clogging her hair follicles.  Reviewed supportive care and red flags that should prompt return.  Pt expressed understanding and is in agreement w/ plan.

## 2018-04-03 DIAGNOSIS — M4326 Fusion of spine, lumbar region: Secondary | ICD-10-CM | POA: Diagnosis not present

## 2018-04-04 DIAGNOSIS — M4326 Fusion of spine, lumbar region: Secondary | ICD-10-CM | POA: Diagnosis not present

## 2018-04-24 DIAGNOSIS — R69 Illness, unspecified: Secondary | ICD-10-CM | POA: Diagnosis not present

## 2018-04-29 DIAGNOSIS — M4326 Fusion of spine, lumbar region: Secondary | ICD-10-CM | POA: Diagnosis not present

## 2018-04-30 DIAGNOSIS — M4326 Fusion of spine, lumbar region: Secondary | ICD-10-CM | POA: Diagnosis not present

## 2018-05-02 IMAGING — MG BREAST SURGICAL SPECIMEN
1 series · 1 of 1 positions shown · non-contrast
Comparison: Previous exam(s).

CLINICAL DATA: Evaluate surgical specimen with radioactive seed
following left lumpectomy for DCIS.

EXAM:
SPECIMEN RADIOGRAPH OF THE LEFT BREAST

[L]
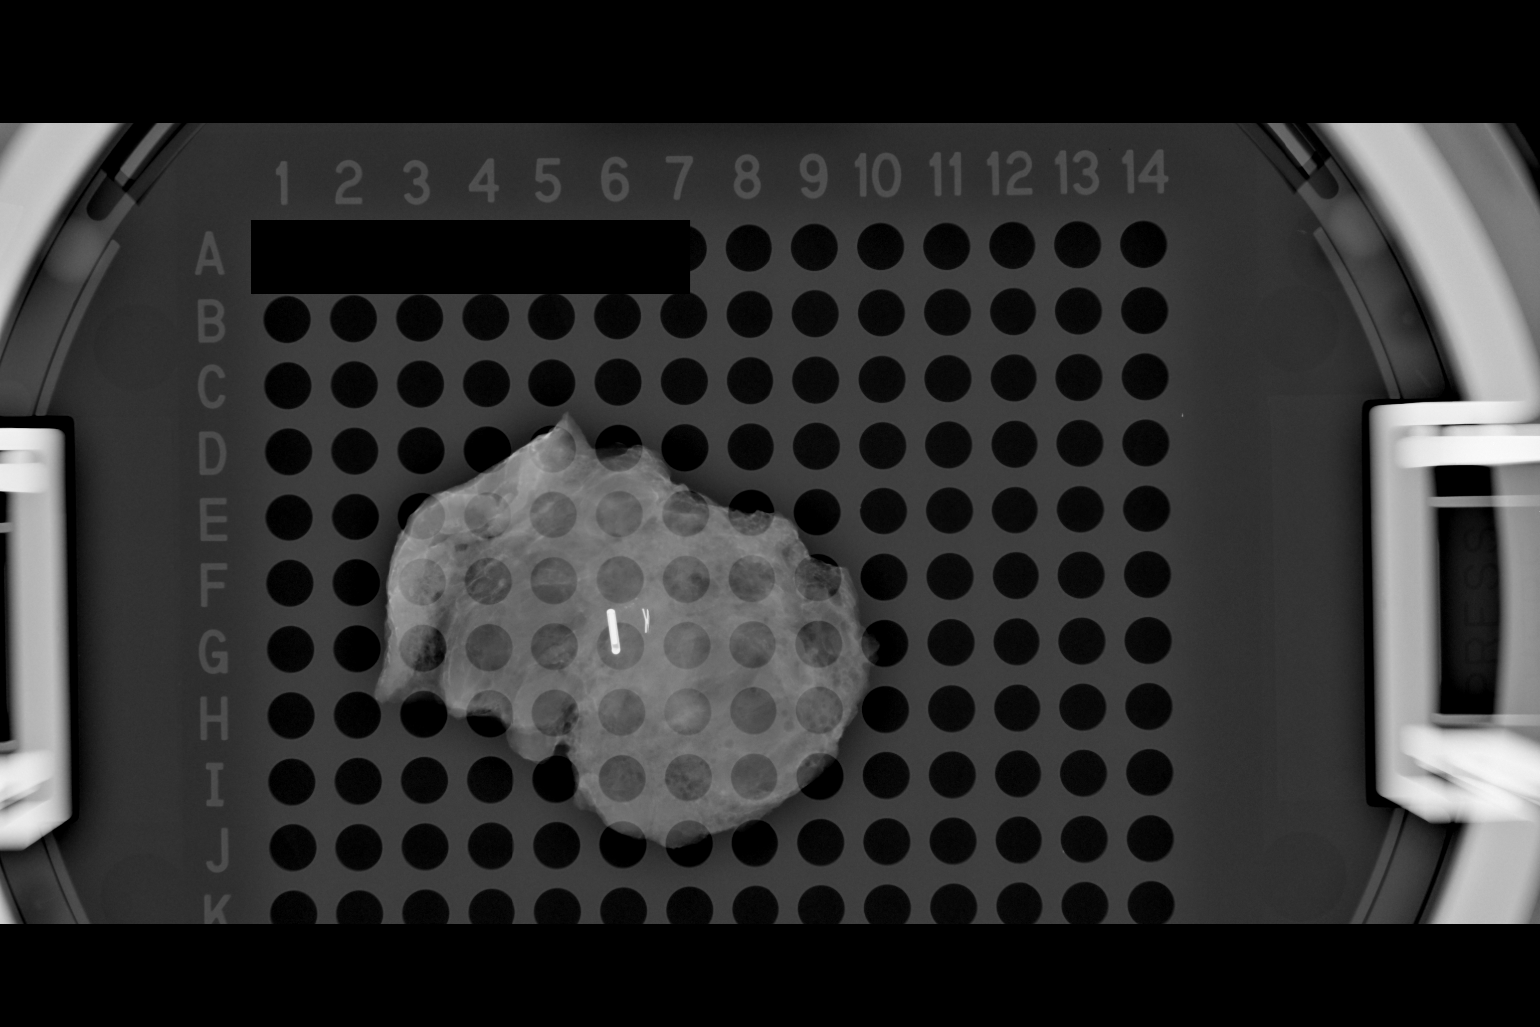

[1 of 1 positions shown; findings below may reference images not displayed]

FINDINGS: Status post excision of the left breast. The radioactive seed and
biopsy marker clip are present, completely intact, and were marked
for pathology.
IMPRESSION: Specimen radiograph of the left breast.

## 2018-05-07 DIAGNOSIS — M4326 Fusion of spine, lumbar region: Secondary | ICD-10-CM | POA: Diagnosis not present

## 2018-05-08 DIAGNOSIS — M4326 Fusion of spine, lumbar region: Secondary | ICD-10-CM | POA: Diagnosis not present

## 2018-05-13 DIAGNOSIS — M4326 Fusion of spine, lumbar region: Secondary | ICD-10-CM | POA: Diagnosis not present

## 2018-05-14 DIAGNOSIS — M4326 Fusion of spine, lumbar region: Secondary | ICD-10-CM | POA: Diagnosis not present

## 2018-05-15 DIAGNOSIS — M4326 Fusion of spine, lumbar region: Secondary | ICD-10-CM | POA: Diagnosis not present

## 2018-05-20 DIAGNOSIS — M4326 Fusion of spine, lumbar region: Secondary | ICD-10-CM | POA: Diagnosis not present

## 2018-05-21 DIAGNOSIS — M4326 Fusion of spine, lumbar region: Secondary | ICD-10-CM | POA: Diagnosis not present

## 2018-05-22 DIAGNOSIS — M4326 Fusion of spine, lumbar region: Secondary | ICD-10-CM | POA: Diagnosis not present

## 2018-05-27 DIAGNOSIS — M4326 Fusion of spine, lumbar region: Secondary | ICD-10-CM | POA: Diagnosis not present

## 2018-05-29 DIAGNOSIS — M4326 Fusion of spine, lumbar region: Secondary | ICD-10-CM | POA: Diagnosis not present

## 2018-05-30 DIAGNOSIS — M4326 Fusion of spine, lumbar region: Secondary | ICD-10-CM | POA: Diagnosis not present

## 2018-06-03 ENCOUNTER — Other Ambulatory Visit: Payer: Self-pay | Admitting: Family Medicine

## 2018-06-03 DIAGNOSIS — M4326 Fusion of spine, lumbar region: Secondary | ICD-10-CM | POA: Diagnosis not present

## 2018-06-04 DIAGNOSIS — M4326 Fusion of spine, lumbar region: Secondary | ICD-10-CM | POA: Diagnosis not present

## 2018-06-05 DIAGNOSIS — M4326 Fusion of spine, lumbar region: Secondary | ICD-10-CM | POA: Diagnosis not present

## 2018-06-19 DIAGNOSIS — M4326 Fusion of spine, lumbar region: Secondary | ICD-10-CM | POA: Diagnosis not present

## 2018-06-20 DIAGNOSIS — M4326 Fusion of spine, lumbar region: Secondary | ICD-10-CM | POA: Diagnosis not present

## 2018-06-24 DIAGNOSIS — M4326 Fusion of spine, lumbar region: Secondary | ICD-10-CM | POA: Diagnosis not present

## 2018-06-25 DIAGNOSIS — M4326 Fusion of spine, lumbar region: Secondary | ICD-10-CM | POA: Diagnosis not present

## 2018-06-26 DIAGNOSIS — M4326 Fusion of spine, lumbar region: Secondary | ICD-10-CM | POA: Diagnosis not present

## 2018-07-01 DIAGNOSIS — M4326 Fusion of spine, lumbar region: Secondary | ICD-10-CM | POA: Diagnosis not present

## 2018-07-02 DIAGNOSIS — M4326 Fusion of spine, lumbar region: Secondary | ICD-10-CM | POA: Diagnosis not present

## 2018-07-03 DIAGNOSIS — M4326 Fusion of spine, lumbar region: Secondary | ICD-10-CM | POA: Diagnosis not present

## 2018-07-09 DIAGNOSIS — M4326 Fusion of spine, lumbar region: Secondary | ICD-10-CM | POA: Diagnosis not present

## 2018-07-11 DIAGNOSIS — M4326 Fusion of spine, lumbar region: Secondary | ICD-10-CM | POA: Diagnosis not present

## 2018-07-12 DIAGNOSIS — H0234 Blepharochalasis left upper eyelid: Secondary | ICD-10-CM | POA: Diagnosis not present

## 2018-07-12 DIAGNOSIS — H25813 Combined forms of age-related cataract, bilateral: Secondary | ICD-10-CM | POA: Diagnosis not present

## 2018-07-12 DIAGNOSIS — H52223 Regular astigmatism, bilateral: Secondary | ICD-10-CM | POA: Diagnosis not present

## 2018-07-12 DIAGNOSIS — H0231 Blepharochalasis right upper eyelid: Secondary | ICD-10-CM | POA: Diagnosis not present

## 2018-07-12 DIAGNOSIS — H5213 Myopia, bilateral: Secondary | ICD-10-CM | POA: Diagnosis not present

## 2018-07-16 DIAGNOSIS — M4326 Fusion of spine, lumbar region: Secondary | ICD-10-CM | POA: Diagnosis not present

## 2018-07-17 DIAGNOSIS — M4326 Fusion of spine, lumbar region: Secondary | ICD-10-CM | POA: Diagnosis not present

## 2018-07-21 ENCOUNTER — Encounter: Payer: Self-pay | Admitting: Family Medicine

## 2018-07-22 DIAGNOSIS — M4326 Fusion of spine, lumbar region: Secondary | ICD-10-CM | POA: Diagnosis not present

## 2018-07-23 DIAGNOSIS — M4326 Fusion of spine, lumbar region: Secondary | ICD-10-CM | POA: Diagnosis not present

## 2018-07-24 DIAGNOSIS — M4326 Fusion of spine, lumbar region: Secondary | ICD-10-CM | POA: Diagnosis not present

## 2018-07-30 DIAGNOSIS — M4326 Fusion of spine, lumbar region: Secondary | ICD-10-CM | POA: Diagnosis not present

## 2018-07-30 DIAGNOSIS — Z0100 Encounter for examination of eyes and vision without abnormal findings: Secondary | ICD-10-CM | POA: Diagnosis not present

## 2018-07-31 DIAGNOSIS — H903 Sensorineural hearing loss, bilateral: Secondary | ICD-10-CM | POA: Diagnosis not present

## 2018-07-31 DIAGNOSIS — H6123 Impacted cerumen, bilateral: Secondary | ICD-10-CM | POA: Diagnosis not present

## 2018-08-07 DIAGNOSIS — M4326 Fusion of spine, lumbar region: Secondary | ICD-10-CM | POA: Diagnosis not present

## 2018-08-08 ENCOUNTER — Ambulatory Visit (INDEPENDENT_AMBULATORY_CARE_PROVIDER_SITE_OTHER): Payer: Medicare HMO

## 2018-08-08 DIAGNOSIS — Z23 Encounter for immunization: Secondary | ICD-10-CM

## 2018-08-08 DIAGNOSIS — M4326 Fusion of spine, lumbar region: Secondary | ICD-10-CM | POA: Diagnosis not present

## 2018-08-12 DIAGNOSIS — M4326 Fusion of spine, lumbar region: Secondary | ICD-10-CM | POA: Diagnosis not present

## 2018-08-20 DIAGNOSIS — M4326 Fusion of spine, lumbar region: Secondary | ICD-10-CM | POA: Diagnosis not present

## 2018-08-21 DIAGNOSIS — M4326 Fusion of spine, lumbar region: Secondary | ICD-10-CM | POA: Diagnosis not present

## 2018-08-22 DIAGNOSIS — R69 Illness, unspecified: Secondary | ICD-10-CM | POA: Diagnosis not present

## 2018-08-23 ENCOUNTER — Telehealth: Payer: Self-pay | Admitting: Family Medicine

## 2018-08-23 NOTE — Telephone Encounter (Signed)
Pt dropped off physician's statement for Dr. Birdie Riddle to complete w/letter attached. Gave to provider with charge sheet.

## 2018-08-26 DIAGNOSIS — M4326 Fusion of spine, lumbar region: Secondary | ICD-10-CM | POA: Diagnosis not present

## 2018-08-26 NOTE — Telephone Encounter (Signed)
Called and left a detailed message on pt voicemail to inform PCP needs answers to the following questions so we can complete her paperwork.   [place] for [facility] to Discuss results / PCP recommendations / Schedule patient.

## 2018-08-26 NOTE — Telephone Encounter (Signed)
I need more information in order to better advise pt.  I don't know what she did to her back recently to cause pain, how long it has been going on, when this acute issue started.  It's hard for me to given any advice or complete forms as I know nothing about this episode

## 2018-08-26 NOTE — Telephone Encounter (Signed)
Patient schedule 08/27/18.

## 2018-08-27 ENCOUNTER — Ambulatory Visit (INDEPENDENT_AMBULATORY_CARE_PROVIDER_SITE_OTHER): Payer: Medicare HMO | Admitting: Family Medicine

## 2018-08-27 ENCOUNTER — Other Ambulatory Visit: Payer: Self-pay

## 2018-08-27 ENCOUNTER — Encounter: Payer: Self-pay | Admitting: Family Medicine

## 2018-08-27 VITALS — BP 130/83 | HR 87 | Temp 98.0°F | Resp 16 | Ht 68.0 in | Wt 142.1 lb

## 2018-08-27 DIAGNOSIS — M545 Low back pain, unspecified: Secondary | ICD-10-CM

## 2018-08-27 NOTE — Progress Notes (Signed)
   Subjective:    Patient ID: Elizabeth [name], female    DOB: January 12, 1941, 77 y.o.   MRN: [id number]  HPI Lumbar back pain- pt had spinal fusion in 2018 and has been 'getting along ok' until she moved furniture at the beach August 29th.  Pt has been doing dry needling and myofascial release which is improving her sxs but given her level of pain, she felt unable to travel to the [place] and [place].  Pt does not get relief from Prednisone.  Has tramadol available to take as needed.  Pain radiates from low back into feet bilaterally, R>L.   Review of Systems For ROS see HPI     Objective:   Physical Exam  Constitutional: She is oriented to person, place, and time. She appears well-developed and well-nourished. No distress.  HENT:  Head: Normocephalic and atraumatic.  Cardiovascular: Intact distal pulses.  Musculoskeletal:  Thoracolumbar scoliosis TTP over R SI joint TTP over R CVA  Neurological: She is alert and oriented to person, place, and time. She displays normal reflexes. No cranial nerve deficit. She exhibits normal muscle tone. Coordination normal.  (-) SLR bilaterally  Skin: Skin is warm and dry. No rash noted. No erythema.  Psychiatric: She has a normal mood and affect. Her behavior is normal. Thought content normal.  Vitals reviewed.         Assessment & Plan:

## 2018-08-27 NOTE — Patient Instructions (Signed)
Follow up as needed or as scheduled Continue your PT as directed Use pain meds as needed Call with any questions or concerns Hang in there!

## 2018-08-27 NOTE — Assessment & Plan Note (Signed)
Deteriorated.  Pt has hx of back pain and lumbar fusion but had an acute exacerbation on 8/29 after moving furniture.  Pt has been getting some relief from PT but the therapist recommended she cancel her international travel due to pain and immobility.  She presented today w/ a medical form for completion for the trip insurance.  Discussed pt's injury, course of treatment and completed form to best of my ability.

## 2018-09-02 DIAGNOSIS — M4326 Fusion of spine, lumbar region: Secondary | ICD-10-CM | POA: Diagnosis not present

## 2018-09-12 ENCOUNTER — Encounter: Payer: Self-pay | Admitting: Family Medicine

## 2018-09-12 DIAGNOSIS — M545 Low back pain, unspecified: Secondary | ICD-10-CM

## 2018-09-25 ENCOUNTER — Ambulatory Visit (INDEPENDENT_AMBULATORY_CARE_PROVIDER_SITE_OTHER): Payer: Medicare HMO | Admitting: Family Medicine

## 2018-09-25 ENCOUNTER — Encounter: Payer: Self-pay | Admitting: Family Medicine

## 2018-09-25 ENCOUNTER — Other Ambulatory Visit: Payer: Self-pay

## 2018-09-25 ENCOUNTER — Encounter: Payer: Self-pay | Admitting: General Practice

## 2018-09-25 VITALS — BP 124/80 | HR 78 | Temp 98.1°F | Resp 16 | Ht 68.0 in | Wt 143.4 lb

## 2018-09-25 DIAGNOSIS — E785 Hyperlipidemia, unspecified: Secondary | ICD-10-CM | POA: Diagnosis not present

## 2018-09-25 LAB — BASIC METABOLIC PANEL
BUN: 13 mg/dL (ref 6–23)
CALCIUM: 10 mg/dL (ref 8.4–10.5)
CO2: 27 meq/L (ref 19–32)
Chloride: 100 mEq/L (ref 96–112)
Creatinine, Ser: 0.73 mg/dL (ref 0.40–1.20)
GFR: 82.43 mL/min (ref >60.00)
GLUCOSE: 110 mg/dL — AB (ref 70–99)
Potassium: 4.1 mEq/L (ref 3.5–5.1)
Sodium: 133 mEq/L — ABNORMAL LOW (ref 135–145)

## 2018-09-25 LAB — HEPATIC FUNCTION PANEL
ALBUMIN: 4.5 g/dL (ref 3.5–5.2)
ALT: 12 U/L (ref 0–35)
AST: 19 U/L (ref 0–37)
Alkaline Phosphatase: 55 U/L (ref 39–117)
Bilirubin, Direct: 0.1 mg/dL (ref 0.0–0.3)
TOTAL PROTEIN: 7.9 g/dL (ref 6.0–8.3)
Total Bilirubin: 0.5 mg/dL (ref 0.2–1.2)

## 2018-09-25 LAB — LIPID PANEL
CHOLESTEROL: 198 mg/dL (ref 0–200)
HDL: 88.2 mg/dL (ref >39.00)
LDL CALC: 93 mg/dL (ref 0–99)
NonHDL: 110.26
TRIGLYCERIDES: 84 mg/dL (ref 0.0–149.0)
Total CHOL/HDL Ratio: 2
VLDL: 16.8 mg/dL (ref 0.0–40.0)

## 2018-09-25 NOTE — Assessment & Plan Note (Signed)
Chronic problem.  Pt's total cholesterol has always been high but it's bc her HDL is so good.  Applauded her efforts at diet and exercise.  Check labs.  Adjust plan prn

## 2018-09-25 NOTE — Progress Notes (Signed)
   Subjective:    Patient ID: Elizabeth [name], female    DOB: 1941-11-20, 77 y.o.   MRN: [id number]  HPI Hyperlipidemia- chronic problem.  Pt has been able to control w/ diet and exercise.  Pt is doing Yoga daily, pilates, some walking.  Has never been on cholesterol meds.  No CP, SOB, HAs, visual changes, abd pain, N/V   Review of Systems For ROS see HPI     Objective:   Physical Exam  Constitutional: She is oriented to person, place, and time. She appears well-developed and well-nourished. No distress.  HENT:  Head: Normocephalic and atraumatic.  Eyes: Pupils are equal, round, and reactive to light. Conjunctivae and EOM are normal.  Neck: Normal range of motion. Neck supple. No thyromegaly present.  Cardiovascular: Normal rate, regular rhythm, normal heart sounds and intact distal pulses.  No murmur heard. Pulmonary/Chest: Effort normal and breath sounds normal. No respiratory distress.  Abdominal: Soft. She exhibits no distension. There is no tenderness.  Musculoskeletal: She exhibits no edema.  Lymphadenopathy:    She has no cervical adenopathy.  Neurological: She is alert and oriented to person, place, and time.  Skin: Skin is warm and dry.  Psychiatric: She has a normal mood and affect. Her behavior is normal.  Vitals reviewed.         Assessment & Plan:

## 2018-09-25 NOTE — Patient Instructions (Signed)
Schedule your complete physical in 6 months We'll notify you of your lab results and make any changes if needed Keep up the good work on healthy diet and regular exercise- you look great!!! Call with any questions or concerns Happy Holidays!!! 

## 2018-10-03 DIAGNOSIS — D0512 Intraductal carcinoma in situ of left breast: Secondary | ICD-10-CM | POA: Diagnosis not present

## 2018-10-30 DIAGNOSIS — H00024 Hordeolum internum left upper eyelid: Secondary | ICD-10-CM | POA: Diagnosis not present

## 2018-11-04 ENCOUNTER — Telehealth: Payer: Self-pay | Admitting: Family Medicine

## 2018-11-04 NOTE — Telephone Encounter (Signed)
Form completed and placed in basket  

## 2018-11-04 NOTE — Telephone Encounter (Signed)
Pt dropped off exercise clearance form to be completed, placed in bin up front w/charge sheet.

## 2018-11-04 NOTE — Telephone Encounter (Signed)
Paperwork given to PCP for signature.  

## 2018-11-04 NOTE — Telephone Encounter (Signed)
fyi

## 2018-11-05 DIAGNOSIS — H0011 Chalazion right upper eyelid: Secondary | ICD-10-CM | POA: Diagnosis not present

## 2018-11-05 DIAGNOSIS — H00024 Hordeolum internum left upper eyelid: Secondary | ICD-10-CM | POA: Diagnosis not present

## 2018-11-05 NOTE — Telephone Encounter (Signed)
Picked up from the back and faxed to the # provided on the form  

## 2019-01-02 DIAGNOSIS — L723 Sebaceous cyst: Secondary | ICD-10-CM | POA: Diagnosis not present

## 2019-01-21 ENCOUNTER — Other Ambulatory Visit: Payer: Self-pay | Admitting: Obstetrics and Gynecology

## 2019-01-21 DIAGNOSIS — Z853 Personal history of malignant neoplasm of breast: Secondary | ICD-10-CM

## 2019-01-23 DIAGNOSIS — G4733 Obstructive sleep apnea (adult) (pediatric): Secondary | ICD-10-CM | POA: Diagnosis not present

## 2019-01-23 DIAGNOSIS — G894 Chronic pain syndrome: Secondary | ICD-10-CM | POA: Diagnosis not present

## 2019-01-23 DIAGNOSIS — R5381 Other malaise: Secondary | ICD-10-CM | POA: Diagnosis not present

## 2019-01-23 DIAGNOSIS — M545 Low back pain: Secondary | ICD-10-CM | POA: Diagnosis not present

## 2019-01-23 DIAGNOSIS — E785 Hyperlipidemia, unspecified: Secondary | ICD-10-CM | POA: Diagnosis not present

## 2019-01-23 DIAGNOSIS — E559 Vitamin D deficiency, unspecified: Secondary | ICD-10-CM | POA: Diagnosis not present

## 2019-01-23 DIAGNOSIS — Z87891 Personal history of nicotine dependence: Secondary | ICD-10-CM | POA: Diagnosis not present

## 2019-01-24 DIAGNOSIS — E559 Vitamin D deficiency, unspecified: Secondary | ICD-10-CM | POA: Diagnosis not present

## 2019-01-24 DIAGNOSIS — Z87891 Personal history of nicotine dependence: Secondary | ICD-10-CM | POA: Diagnosis not present

## 2019-01-24 DIAGNOSIS — G4733 Obstructive sleep apnea (adult) (pediatric): Secondary | ICD-10-CM | POA: Diagnosis not present

## 2019-01-24 DIAGNOSIS — E785 Hyperlipidemia, unspecified: Secondary | ICD-10-CM | POA: Diagnosis not present

## 2019-01-24 DIAGNOSIS — R5381 Other malaise: Secondary | ICD-10-CM | POA: Diagnosis not present

## 2019-01-24 DIAGNOSIS — G894 Chronic pain syndrome: Secondary | ICD-10-CM | POA: Diagnosis not present

## 2019-01-24 DIAGNOSIS — M545 Low back pain: Secondary | ICD-10-CM | POA: Diagnosis not present

## 2019-01-27 DIAGNOSIS — E785 Hyperlipidemia, unspecified: Secondary | ICD-10-CM | POA: Diagnosis not present

## 2019-01-27 DIAGNOSIS — R5381 Other malaise: Secondary | ICD-10-CM | POA: Diagnosis not present

## 2019-01-27 DIAGNOSIS — G894 Chronic pain syndrome: Secondary | ICD-10-CM | POA: Diagnosis not present

## 2019-01-27 DIAGNOSIS — Z87891 Personal history of nicotine dependence: Secondary | ICD-10-CM | POA: Diagnosis not present

## 2019-01-27 DIAGNOSIS — M545 Low back pain: Secondary | ICD-10-CM | POA: Diagnosis not present

## 2019-01-27 DIAGNOSIS — E559 Vitamin D deficiency, unspecified: Secondary | ICD-10-CM | POA: Diagnosis not present

## 2019-01-27 DIAGNOSIS — G4733 Obstructive sleep apnea (adult) (pediatric): Secondary | ICD-10-CM | POA: Diagnosis not present

## 2019-01-28 DIAGNOSIS — M545 Low back pain: Secondary | ICD-10-CM | POA: Diagnosis not present

## 2019-01-28 DIAGNOSIS — R5381 Other malaise: Secondary | ICD-10-CM | POA: Diagnosis not present

## 2019-01-28 DIAGNOSIS — E785 Hyperlipidemia, unspecified: Secondary | ICD-10-CM | POA: Diagnosis not present

## 2019-01-28 DIAGNOSIS — G4733 Obstructive sleep apnea (adult) (pediatric): Secondary | ICD-10-CM | POA: Diagnosis not present

## 2019-01-28 DIAGNOSIS — E559 Vitamin D deficiency, unspecified: Secondary | ICD-10-CM | POA: Diagnosis not present

## 2019-01-28 DIAGNOSIS — Z87891 Personal history of nicotine dependence: Secondary | ICD-10-CM | POA: Diagnosis not present

## 2019-01-28 DIAGNOSIS — G894 Chronic pain syndrome: Secondary | ICD-10-CM | POA: Diagnosis not present

## 2019-01-29 DIAGNOSIS — R5381 Other malaise: Secondary | ICD-10-CM | POA: Diagnosis not present

## 2019-01-29 DIAGNOSIS — E559 Vitamin D deficiency, unspecified: Secondary | ICD-10-CM | POA: Diagnosis not present

## 2019-01-29 DIAGNOSIS — Z87891 Personal history of nicotine dependence: Secondary | ICD-10-CM | POA: Diagnosis not present

## 2019-01-29 DIAGNOSIS — G894 Chronic pain syndrome: Secondary | ICD-10-CM | POA: Diagnosis not present

## 2019-01-29 DIAGNOSIS — G4733 Obstructive sleep apnea (adult) (pediatric): Secondary | ICD-10-CM | POA: Diagnosis not present

## 2019-01-29 DIAGNOSIS — M545 Low back pain: Secondary | ICD-10-CM | POA: Diagnosis not present

## 2019-01-29 DIAGNOSIS — E785 Hyperlipidemia, unspecified: Secondary | ICD-10-CM | POA: Diagnosis not present

## 2019-01-30 DIAGNOSIS — E559 Vitamin D deficiency, unspecified: Secondary | ICD-10-CM | POA: Diagnosis not present

## 2019-01-30 DIAGNOSIS — E785 Hyperlipidemia, unspecified: Secondary | ICD-10-CM | POA: Diagnosis not present

## 2019-01-30 DIAGNOSIS — G4733 Obstructive sleep apnea (adult) (pediatric): Secondary | ICD-10-CM | POA: Diagnosis not present

## 2019-01-30 DIAGNOSIS — G894 Chronic pain syndrome: Secondary | ICD-10-CM | POA: Diagnosis not present

## 2019-01-30 DIAGNOSIS — M545 Low back pain: Secondary | ICD-10-CM | POA: Diagnosis not present

## 2019-01-30 DIAGNOSIS — R5381 Other malaise: Secondary | ICD-10-CM | POA: Diagnosis not present

## 2019-01-30 DIAGNOSIS — Z87891 Personal history of nicotine dependence: Secondary | ICD-10-CM | POA: Diagnosis not present

## 2019-01-31 DIAGNOSIS — G4733 Obstructive sleep apnea (adult) (pediatric): Secondary | ICD-10-CM | POA: Diagnosis not present

## 2019-01-31 DIAGNOSIS — G894 Chronic pain syndrome: Secondary | ICD-10-CM | POA: Diagnosis not present

## 2019-01-31 DIAGNOSIS — E785 Hyperlipidemia, unspecified: Secondary | ICD-10-CM | POA: Diagnosis not present

## 2019-01-31 DIAGNOSIS — Z87891 Personal history of nicotine dependence: Secondary | ICD-10-CM | POA: Diagnosis not present

## 2019-01-31 DIAGNOSIS — E559 Vitamin D deficiency, unspecified: Secondary | ICD-10-CM | POA: Diagnosis not present

## 2019-01-31 DIAGNOSIS — M545 Low back pain: Secondary | ICD-10-CM | POA: Diagnosis not present

## 2019-01-31 DIAGNOSIS — R5381 Other malaise: Secondary | ICD-10-CM | POA: Diagnosis not present

## 2019-02-03 DIAGNOSIS — G4733 Obstructive sleep apnea (adult) (pediatric): Secondary | ICD-10-CM | POA: Diagnosis not present

## 2019-02-03 DIAGNOSIS — R5381 Other malaise: Secondary | ICD-10-CM | POA: Diagnosis not present

## 2019-02-03 DIAGNOSIS — M545 Low back pain: Secondary | ICD-10-CM | POA: Diagnosis not present

## 2019-02-03 DIAGNOSIS — E785 Hyperlipidemia, unspecified: Secondary | ICD-10-CM | POA: Diagnosis not present

## 2019-02-03 DIAGNOSIS — E559 Vitamin D deficiency, unspecified: Secondary | ICD-10-CM | POA: Diagnosis not present

## 2019-02-03 DIAGNOSIS — Z87891 Personal history of nicotine dependence: Secondary | ICD-10-CM | POA: Diagnosis not present

## 2019-02-03 DIAGNOSIS — G894 Chronic pain syndrome: Secondary | ICD-10-CM | POA: Diagnosis not present

## 2019-02-04 DIAGNOSIS — E559 Vitamin D deficiency, unspecified: Secondary | ICD-10-CM | POA: Diagnosis not present

## 2019-02-04 DIAGNOSIS — M545 Low back pain: Secondary | ICD-10-CM | POA: Diagnosis not present

## 2019-02-04 DIAGNOSIS — E785 Hyperlipidemia, unspecified: Secondary | ICD-10-CM | POA: Diagnosis not present

## 2019-02-04 DIAGNOSIS — G4733 Obstructive sleep apnea (adult) (pediatric): Secondary | ICD-10-CM | POA: Diagnosis not present

## 2019-02-04 DIAGNOSIS — Z87891 Personal history of nicotine dependence: Secondary | ICD-10-CM | POA: Diagnosis not present

## 2019-02-04 DIAGNOSIS — G894 Chronic pain syndrome: Secondary | ICD-10-CM | POA: Diagnosis not present

## 2019-02-04 DIAGNOSIS — R5381 Other malaise: Secondary | ICD-10-CM | POA: Diagnosis not present

## 2019-02-05 DIAGNOSIS — G4733 Obstructive sleep apnea (adult) (pediatric): Secondary | ICD-10-CM | POA: Diagnosis not present

## 2019-02-05 DIAGNOSIS — R5381 Other malaise: Secondary | ICD-10-CM | POA: Diagnosis not present

## 2019-02-05 DIAGNOSIS — E785 Hyperlipidemia, unspecified: Secondary | ICD-10-CM | POA: Diagnosis not present

## 2019-02-05 DIAGNOSIS — Z87891 Personal history of nicotine dependence: Secondary | ICD-10-CM | POA: Diagnosis not present

## 2019-02-05 DIAGNOSIS — G894 Chronic pain syndrome: Secondary | ICD-10-CM | POA: Diagnosis not present

## 2019-02-05 DIAGNOSIS — M545 Low back pain: Secondary | ICD-10-CM | POA: Diagnosis not present

## 2019-02-05 DIAGNOSIS — E559 Vitamin D deficiency, unspecified: Secondary | ICD-10-CM | POA: Diagnosis not present

## 2019-02-06 DIAGNOSIS — G4733 Obstructive sleep apnea (adult) (pediatric): Secondary | ICD-10-CM | POA: Diagnosis not present

## 2019-02-06 DIAGNOSIS — M545 Low back pain: Secondary | ICD-10-CM | POA: Diagnosis not present

## 2019-02-06 DIAGNOSIS — R5381 Other malaise: Secondary | ICD-10-CM | POA: Diagnosis not present

## 2019-02-06 DIAGNOSIS — Z87891 Personal history of nicotine dependence: Secondary | ICD-10-CM | POA: Diagnosis not present

## 2019-02-06 DIAGNOSIS — E559 Vitamin D deficiency, unspecified: Secondary | ICD-10-CM | POA: Diagnosis not present

## 2019-02-06 DIAGNOSIS — G894 Chronic pain syndrome: Secondary | ICD-10-CM | POA: Diagnosis not present

## 2019-02-06 DIAGNOSIS — E785 Hyperlipidemia, unspecified: Secondary | ICD-10-CM | POA: Diagnosis not present

## 2019-02-07 DIAGNOSIS — M545 Low back pain: Secondary | ICD-10-CM | POA: Diagnosis not present

## 2019-02-07 DIAGNOSIS — Z87891 Personal history of nicotine dependence: Secondary | ICD-10-CM | POA: Diagnosis not present

## 2019-02-07 DIAGNOSIS — G894 Chronic pain syndrome: Secondary | ICD-10-CM | POA: Diagnosis not present

## 2019-03-06 ENCOUNTER — Other Ambulatory Visit: Payer: Self-pay | Admitting: Adult Health

## 2019-03-06 DIAGNOSIS — Z853 Personal history of malignant neoplasm of breast: Secondary | ICD-10-CM

## 2019-03-10 ENCOUNTER — Other Ambulatory Visit: Payer: Self-pay

## 2019-03-10 ENCOUNTER — Ambulatory Visit
Admission: RE | Admit: 2019-03-10 | Discharge: 2019-03-10 | Disposition: A | Discharge status: Home / Self Care | Payer: Medicare HMO | Source: Ambulatory Visit | Attending: Obstetrics and Gynecology | Admitting: Obstetrics and Gynecology

## 2019-03-10 DIAGNOSIS — Z853 Personal history of malignant neoplasm of breast: Secondary | ICD-10-CM

## 2019-03-10 HISTORY — DX: Personal history of irradiation: Z92.3

## 2019-03-26 ENCOUNTER — Encounter: Payer: Medicare HMO | Admitting: Family Medicine

## 2019-04-02 DIAGNOSIS — D0512 Intraductal carcinoma in situ of left breast: Secondary | ICD-10-CM | POA: Diagnosis not present

## 2019-05-05 ENCOUNTER — Encounter: Payer: Self-pay | Admitting: Family Medicine

## 2019-05-07 ENCOUNTER — Other Ambulatory Visit: Payer: Self-pay

## 2019-05-07 ENCOUNTER — Encounter: Payer: Self-pay | Admitting: Family Medicine

## 2019-05-07 ENCOUNTER — Ambulatory Visit (INDEPENDENT_AMBULATORY_CARE_PROVIDER_SITE_OTHER): Payer: Medicare HMO | Admitting: Family Medicine

## 2019-05-07 VITALS — HR 80 | Temp 96.1°F | Ht 67.0 in | Wt 142.6 lb

## 2019-05-07 DIAGNOSIS — M545 Low back pain, unspecified: Secondary | ICD-10-CM

## 2019-05-07 MED ORDER — GABAPENTIN 100 MG PO CAPS: 100.0000 mg | ORAL_CAPSULE | Freq: Two times a day (BID) | ORAL | 3 refills | Status: DC

## 2019-05-07 NOTE — Progress Notes (Signed)
Virtual Visit via Video   I connected with patient on 05/07/19 at  9:20 AM EDT by a video enabled telemedicine application and verified that I am speaking with the correct person using two identifiers.  Location patient: Home Location provider: Acupuncturist, Office Persons participating in the virtual visit: Patient, Provider, [place] (Jess B)  I discussed the limitations of evaluation and management by telemedicine and the availability of in person appointments. The patient expressed understanding and agreed to proceed.  Subjective:   HPI:   Leg pain- ongoing issue for pt due to lumbar issues.  Went to [facility] for pain rehabilitation- 'it's a great program and I made a lot of progress' but program got suspended due to [place].  Pt has been doing PT, keeping a daily log, breathing exercises, meditation, CBT weekly.  Pain radiates from sacrum down both legs into feet.  During the day 'it's a pressure' and at night 'it's like RLS'.  PT mentioned Gabapentin.  Took 100mg  of husband's left over gabapentin and reports 'it makes a big difference'.  Pt reports the pill before bed carries over into the AM.  ROS:   See pertinent positives and negatives per HPI.  Patient Active Problem List   Diagnosis Date Noted  . Malignant neoplasm of lower-outer quadrant of left breast of female, estrogen receptor positive ([place]) 04/03/2017  . Hyperlipidemia 12/23/2015  . Lumbar back pain 12/23/2015  . Routine general medical examination at a health care facility 10/08/2013  . Chronic back pain 12/26/2012  . Blood glucose elevated 12/26/2012    Social History   Tobacco Use  . Smoking status: Former Smoker    Types: Cigarettes    Quit date: 02/18/1981    Years since quitting: 38.2  . Smokeless tobacco: Never Used  Substance Use Topics  . Alcohol use: Yes    Alcohol/week: 2.0 standard drinks    Types: 2 Glasses of wine per week    Comment: 2 glasses 4 days/week    Current Outpatient Medications:   .  acetaminophen (TYLENOL) 500 MG tablet, Take 1,000 mg by mouth 3 (three) times daily as needed for moderate pain. , Disp: , Rfl:  .  Biotin 2.5 MG CAPS, Take 2.5 mg by mouth daily. , Disp: , Rfl:  .  Carboxymethylcellul-Glycerin (LUBRICATING EYE DROPS OP), Apply 1 drop to eye daily as needed (dry eyes)., Disp: , Rfl:  .  cetirizine (ZYRTEC) 10 MG tablet, Take 10 mg by mouth daily., Disp: , Rfl:  .  Cholecalciferol (VITAMIN D) 2000 UNITS CAPS, Take 2,000 Units by mouth daily. , Disp: , Rfl:  .  diclofenac sodium (VOLTAREN) 1 % GEL, APPLY 4 GRAMS TOPICALLY 4 TIMES A DAY, Disp: 100 g, Rfl: 1 .  fluticasone (FLONASE) 50 MCG/ACT nasal spray, Place 1 spray into both nostrils at bedtime. , Disp: , Rfl:  .  GLUCOSAMINE-CHONDROITIN PO, Take 3 tablets by mouth daily., Disp: , Rfl:  .  Methylcellulose, Laxative, (CITRUCEL PO), Take 2 tablets by mouth daily., Disp: , Rfl:  .  mometasone (ELOCON) 0.1 % cream, Apply 1 application topically daily., Disp: 45 g, Rfl: 0 .  montelukast (SINGULAIR) 10 MG tablet, Take 10 mg by mouth at bedtime., Disp: , Rfl:  .  Nutritional Supplements (JUICE PLUS FIBRE PO), Take by mouth. Juice plus fruit 4 tablets daily Juice plus veggie 4 tablets daily, Disp: , Rfl:  .  Probiotic Product (ADVANCED PROBIOTIC 10) CAPS, Take 1 capsule by mouth daily., Disp: , Rfl:  .  traMADol (ULTRAM) 50 MG tablet, tramadol 50 mg tablet  TAKE 1 TABLET BY MOUTH EVERY 6 HOURS AS NEEDED FOR PAIN, Disp: , Rfl:  .  Turmeric 500 MG TABS, Take 1,000 mg by mouth daily., Disp: , Rfl:  .  VITAMIN B COMPLEX-C CAPS, , Disp: , Rfl:   Allergies  Allergen Reactions  . No Known Allergies     Objective:   Pulse 80   Temp (!) 96.1 F (35.6 C) (Tympanic)   Ht 5\' 7"  (1.702 m)   Wt 142 lb 9 oz (64.7 kg)   BMI 22.33 kg/m   AAOx3, NAD NCAT, EOMI No obvious CN deficits Coloring WNL Pt is able to speak clearly, coherently without shortness of breath or increased work of breathing.  Thought process is  linear.  Mood is appropriate.   Assessment and Plan:   Lumbar back pain- pt did well w/ [facility] rehab program but continues to have pain.  Took husband's left over gabapentin x1 week and notes big improvement in sxs.  Will prescribe BID and monitor for improvement.  Discussed possible side effects- sedation in particular- and pt will monitor.  Pt expressed understanding and is in agreement w/ plan.    Annye Asa, MD 05/07/2019

## 2019-05-07 NOTE — Progress Notes (Signed)
I have discussed the procedure for the virtual visit with the patient who has given consent to proceed with assessment and treatment.   Jessica L Brodmerkel, CMA     

## 2019-05-12 DIAGNOSIS — Z01419 Encounter for gynecological examination (general) (routine) without abnormal findings: Secondary | ICD-10-CM | POA: Diagnosis not present

## 2019-05-12 DIAGNOSIS — Z124 Encounter for screening for malignant neoplasm of cervix: Secondary | ICD-10-CM | POA: Diagnosis not present

## 2019-05-12 LAB — HM PAP SMEAR

## 2019-05-12 LAB — HM MAMMOGRAPHY: HM Mammogram: NORMAL (ref 0–4)

## 2019-05-13 ENCOUNTER — Encounter: Payer: Self-pay | Admitting: General Practice

## 2019-05-19 DIAGNOSIS — M4326 Fusion of spine, lumbar region: Secondary | ICD-10-CM | POA: Diagnosis not present

## 2019-05-19 DIAGNOSIS — Z981 Arthrodesis status: Secondary | ICD-10-CM | POA: Diagnosis not present

## 2019-05-19 DIAGNOSIS — M4325 Fusion of spine, thoracolumbar region: Secondary | ICD-10-CM | POA: Diagnosis not present

## 2019-05-19 DIAGNOSIS — M160 Bilateral primary osteoarthritis of hip: Secondary | ICD-10-CM | POA: Diagnosis not present

## 2019-05-19 DIAGNOSIS — M47812 Spondylosis without myelopathy or radiculopathy, cervical region: Secondary | ICD-10-CM | POA: Diagnosis not present

## 2019-05-23 ENCOUNTER — Encounter: Payer: Self-pay | Admitting: Family Medicine

## 2019-05-26 MED ORDER — GABAPENTIN 100 MG PO CAPS: 100.0000 mg | ORAL_CAPSULE | Freq: Three times a day (TID) | ORAL | 3 refills | Status: DC

## 2019-05-28 ENCOUNTER — Encounter: Payer: Self-pay | Admitting: General Practice

## 2019-05-29 DIAGNOSIS — H25813 Combined forms of age-related cataract, bilateral: Secondary | ICD-10-CM | POA: Diagnosis not present

## 2019-05-29 DIAGNOSIS — H524 Presbyopia: Secondary | ICD-10-CM | POA: Diagnosis not present

## 2019-05-29 DIAGNOSIS — H52223 Regular astigmatism, bilateral: Secondary | ICD-10-CM | POA: Diagnosis not present

## 2019-05-29 DIAGNOSIS — H5213 Myopia, bilateral: Secondary | ICD-10-CM | POA: Diagnosis not present

## 2019-06-18 ENCOUNTER — Other Ambulatory Visit: Payer: Self-pay | Admitting: Family Medicine

## 2019-06-23 ENCOUNTER — Encounter: Payer: Self-pay | Admitting: Family Medicine

## 2019-06-24 DIAGNOSIS — H903 Sensorineural hearing loss, bilateral: Secondary | ICD-10-CM | POA: Diagnosis not present

## 2019-07-06 DIAGNOSIS — Z1159 Encounter for screening for other viral diseases: Secondary | ICD-10-CM | POA: Diagnosis not present

## 2019-07-08 DIAGNOSIS — Z87891 Personal history of nicotine dependence: Secondary | ICD-10-CM | POA: Diagnosis not present

## 2019-07-08 DIAGNOSIS — R5381 Other malaise: Secondary | ICD-10-CM | POA: Diagnosis not present

## 2019-07-08 DIAGNOSIS — G894 Chronic pain syndrome: Secondary | ICD-10-CM | POA: Diagnosis not present

## 2019-07-08 DIAGNOSIS — M545 Low back pain: Secondary | ICD-10-CM | POA: Diagnosis not present

## 2019-07-10 DIAGNOSIS — R5381 Other malaise: Secondary | ICD-10-CM | POA: Diagnosis not present

## 2019-07-10 DIAGNOSIS — G894 Chronic pain syndrome: Secondary | ICD-10-CM | POA: Diagnosis not present

## 2019-07-10 DIAGNOSIS — Z87891 Personal history of nicotine dependence: Secondary | ICD-10-CM | POA: Diagnosis not present

## 2019-07-10 DIAGNOSIS — M545 Low back pain: Secondary | ICD-10-CM | POA: Diagnosis not present

## 2019-07-11 DIAGNOSIS — M545 Low back pain: Secondary | ICD-10-CM | POA: Diagnosis not present

## 2019-07-11 DIAGNOSIS — R5381 Other malaise: Secondary | ICD-10-CM | POA: Diagnosis not present

## 2019-07-11 DIAGNOSIS — G894 Chronic pain syndrome: Secondary | ICD-10-CM | POA: Diagnosis not present

## 2019-07-11 DIAGNOSIS — Z87891 Personal history of nicotine dependence: Secondary | ICD-10-CM | POA: Diagnosis not present

## 2019-07-14 DIAGNOSIS — Z87891 Personal history of nicotine dependence: Secondary | ICD-10-CM | POA: Diagnosis not present

## 2019-07-14 DIAGNOSIS — R5381 Other malaise: Secondary | ICD-10-CM | POA: Diagnosis not present

## 2019-07-14 DIAGNOSIS — G894 Chronic pain syndrome: Secondary | ICD-10-CM | POA: Diagnosis not present

## 2019-07-14 DIAGNOSIS — M545 Low back pain: Secondary | ICD-10-CM | POA: Diagnosis not present

## 2019-07-15 DIAGNOSIS — M545 Low back pain: Secondary | ICD-10-CM | POA: Diagnosis not present

## 2019-07-15 DIAGNOSIS — G894 Chronic pain syndrome: Secondary | ICD-10-CM | POA: Diagnosis not present

## 2019-07-15 DIAGNOSIS — Z87891 Personal history of nicotine dependence: Secondary | ICD-10-CM | POA: Diagnosis not present

## 2019-07-15 DIAGNOSIS — R5381 Other malaise: Secondary | ICD-10-CM | POA: Diagnosis not present

## 2019-07-16 DIAGNOSIS — R5381 Other malaise: Secondary | ICD-10-CM | POA: Diagnosis not present

## 2019-07-16 DIAGNOSIS — Z87891 Personal history of nicotine dependence: Secondary | ICD-10-CM | POA: Diagnosis not present

## 2019-07-16 DIAGNOSIS — G894 Chronic pain syndrome: Secondary | ICD-10-CM | POA: Diagnosis not present

## 2019-07-16 DIAGNOSIS — M545 Low back pain: Secondary | ICD-10-CM | POA: Diagnosis not present

## 2019-07-17 DIAGNOSIS — M545 Low back pain: Secondary | ICD-10-CM | POA: Diagnosis not present

## 2019-07-17 DIAGNOSIS — Z1159 Encounter for screening for other viral diseases: Secondary | ICD-10-CM | POA: Diagnosis not present

## 2019-07-17 DIAGNOSIS — R5381 Other malaise: Secondary | ICD-10-CM | POA: Diagnosis not present

## 2019-07-17 DIAGNOSIS — G894 Chronic pain syndrome: Secondary | ICD-10-CM | POA: Diagnosis not present

## 2019-07-17 DIAGNOSIS — Z87891 Personal history of nicotine dependence: Secondary | ICD-10-CM | POA: Diagnosis not present

## 2019-07-18 ENCOUNTER — Encounter: Payer: Medicare HMO | Admitting: Family Medicine

## 2019-07-18 DIAGNOSIS — M545 Low back pain: Secondary | ICD-10-CM | POA: Diagnosis not present

## 2019-07-18 DIAGNOSIS — G894 Chronic pain syndrome: Secondary | ICD-10-CM | POA: Diagnosis not present

## 2019-07-18 DIAGNOSIS — R5381 Other malaise: Secondary | ICD-10-CM | POA: Diagnosis not present

## 2019-07-18 DIAGNOSIS — Z87891 Personal history of nicotine dependence: Secondary | ICD-10-CM | POA: Diagnosis not present

## 2019-07-21 DIAGNOSIS — Z87891 Personal history of nicotine dependence: Secondary | ICD-10-CM | POA: Diagnosis not present

## 2019-07-21 DIAGNOSIS — M545 Low back pain: Secondary | ICD-10-CM | POA: Diagnosis not present

## 2019-07-21 DIAGNOSIS — R5381 Other malaise: Secondary | ICD-10-CM | POA: Diagnosis not present

## 2019-07-21 DIAGNOSIS — G894 Chronic pain syndrome: Secondary | ICD-10-CM | POA: Diagnosis not present

## 2019-07-22 DIAGNOSIS — M545 Low back pain: Secondary | ICD-10-CM | POA: Diagnosis not present

## 2019-07-22 DIAGNOSIS — G894 Chronic pain syndrome: Secondary | ICD-10-CM | POA: Diagnosis not present

## 2019-07-22 DIAGNOSIS — Z87891 Personal history of nicotine dependence: Secondary | ICD-10-CM | POA: Diagnosis not present

## 2019-07-22 DIAGNOSIS — R5381 Other malaise: Secondary | ICD-10-CM | POA: Diagnosis not present

## 2019-07-23 DIAGNOSIS — R5381 Other malaise: Secondary | ICD-10-CM | POA: Diagnosis not present

## 2019-07-23 DIAGNOSIS — Z87891 Personal history of nicotine dependence: Secondary | ICD-10-CM | POA: Diagnosis not present

## 2019-07-23 DIAGNOSIS — M545 Low back pain: Secondary | ICD-10-CM | POA: Diagnosis not present

## 2019-07-23 DIAGNOSIS — G894 Chronic pain syndrome: Secondary | ICD-10-CM | POA: Diagnosis not present

## 2019-07-24 DIAGNOSIS — G894 Chronic pain syndrome: Secondary | ICD-10-CM | POA: Diagnosis not present

## 2019-07-24 DIAGNOSIS — R5381 Other malaise: Secondary | ICD-10-CM | POA: Diagnosis not present

## 2019-07-24 DIAGNOSIS — Z87891 Personal history of nicotine dependence: Secondary | ICD-10-CM | POA: Diagnosis not present

## 2019-07-24 DIAGNOSIS — M545 Low back pain: Secondary | ICD-10-CM | POA: Diagnosis not present

## 2019-07-25 DIAGNOSIS — G894 Chronic pain syndrome: Secondary | ICD-10-CM | POA: Diagnosis not present

## 2019-07-25 DIAGNOSIS — Z87891 Personal history of nicotine dependence: Secondary | ICD-10-CM | POA: Diagnosis not present

## 2019-07-25 DIAGNOSIS — M545 Low back pain: Secondary | ICD-10-CM | POA: Diagnosis not present

## 2019-07-29 DIAGNOSIS — R5381 Other malaise: Secondary | ICD-10-CM | POA: Diagnosis not present

## 2019-07-29 DIAGNOSIS — G894 Chronic pain syndrome: Secondary | ICD-10-CM | POA: Diagnosis not present

## 2019-07-29 DIAGNOSIS — M545 Low back pain: Secondary | ICD-10-CM | POA: Diagnosis not present

## 2019-07-29 DIAGNOSIS — Z87891 Personal history of nicotine dependence: Secondary | ICD-10-CM | POA: Diagnosis not present

## 2019-07-30 DIAGNOSIS — Z87891 Personal history of nicotine dependence: Secondary | ICD-10-CM | POA: Diagnosis not present

## 2019-07-30 DIAGNOSIS — G894 Chronic pain syndrome: Secondary | ICD-10-CM | POA: Diagnosis not present

## 2019-07-30 DIAGNOSIS — M545 Low back pain: Secondary | ICD-10-CM | POA: Diagnosis not present

## 2019-07-31 DIAGNOSIS — R5381 Other malaise: Secondary | ICD-10-CM | POA: Diagnosis not present

## 2019-07-31 DIAGNOSIS — G894 Chronic pain syndrome: Secondary | ICD-10-CM | POA: Diagnosis not present

## 2019-07-31 DIAGNOSIS — Z87891 Personal history of nicotine dependence: Secondary | ICD-10-CM | POA: Diagnosis not present

## 2019-07-31 DIAGNOSIS — M545 Low back pain: Secondary | ICD-10-CM | POA: Diagnosis not present

## 2019-08-06 ENCOUNTER — Encounter: Payer: Self-pay | Admitting: Family Medicine

## 2019-08-06 ENCOUNTER — Ambulatory Visit (INDEPENDENT_AMBULATORY_CARE_PROVIDER_SITE_OTHER): Payer: Medicare HMO | Admitting: Family Medicine

## 2019-08-06 ENCOUNTER — Other Ambulatory Visit: Payer: Self-pay

## 2019-08-06 VITALS — BP 121/81 | HR 76 | Temp 97.4°F | Resp 16 | Ht 67.0 in | Wt 145.2 lb

## 2019-08-06 DIAGNOSIS — Z23 Encounter for immunization: Secondary | ICD-10-CM | POA: Diagnosis not present

## 2019-08-06 DIAGNOSIS — Z0000 Encounter for general adult medical examination without abnormal findings: Secondary | ICD-10-CM

## 2019-08-06 DIAGNOSIS — E785 Hyperlipidemia, unspecified: Secondary | ICD-10-CM

## 2019-08-06 NOTE — Progress Notes (Signed)
   Subjective:    Patient ID: Elizabeth [name], female    DOB: 12-30-1940, 78 y.o.   MRN: [id number]  HPI CPE- UTD on mammo, colonoscopy, pap, Pneumonia vaccines.  Due for flu.  No concerns today.   Review of Systems Patient reports no vision/ hearing changes, adenopathy,fever, weight change,  persistant/recurrent hoarseness, chest pain, palpitations, edema, persistant/recurrent cough, hemoptysis, dyspnea (rest/exertional/paroxysmal nocturnal), gastrointestinal bleeding (melena, rectal bleeding), abdominal pain, significant heartburn, GU symptoms (dysuria, hematuria, incontinence), Gyn symptoms (abnormal  bleeding, pain),  syncope, focal weakness, memory loss, numbness & tingling, skin/hair/nail changes, abnormal bruising or bleeding, anxiety, or depression.   + dysphagia- trouble w/ pills, liquid, food.  Occurs intermittently.  Not more frequent- stable. + loose stools at time.  Is off constipating meds.  No longer taking Probiotic.    Objective:   Physical Exam General Appearance:    Alert, cooperative, no distress, appears stated age  Head:    Normocephalic, without obvious abnormality, atraumatic  Eyes:    PERRL, conjunctiva/corneas clear, EOM's intact, fundi    benign, both eyes  Ears:    Normal TM's and external ear canals, both ears  Nose:   Deferred due to COVID  Throat:   Neck:   Supple, symmetrical, trachea midline, no adenopathy;    Thyroid: no enlargement/tenderness/nodules  Back:     Symmetric, no curvature, ROM normal, no CVA tenderness  Lungs:     Clear to auscultation bilaterally, respirations unlabored  Chest Wall:    No tenderness or deformity   Heart:    Regular rate and rhythm, S1 and S2 normal, no murmur, rub   or gallop  Breast Exam:    Deferred to GYN  Abdomen:     Soft, non-tender, bowel sounds active all four quadrants,    no masses, no organomegaly  Genitalia:    Deferred to GYN  Rectal:    Extremities:   Extremities normal, atraumatic, no cyanosis  or edema  Pulses:   2+ and symmetric all extremities  Skin:   Skin color, texture, turgor normal, no rashes or lesions  Lymph nodes:   Cervical, supraclavicular, and axillary nodes normal  Neurologic:   CNII-XII intact, normal strength, sensation and reflexes    throughout          Assessment & Plan:

## 2019-08-06 NOTE — Assessment & Plan Note (Signed)
Pt's PE WNL.  UTD on mammo, colonoscopy, pap, Pneumonia vaccines.  Flu shot given.  Check labs.  Anticipatory guidance provided.

## 2019-08-06 NOTE — Assessment & Plan Note (Signed)
Pt has been able to control w/ diet and exercise.  Check labs and adjust tx prn.

## 2019-08-06 NOTE — Patient Instructions (Addendum)
Follow up in 1 year or as needed We'll notify you of your lab results and make any changes if needed Continue to work on healthy diet and regular exercise- you look great!! Call with any questions or concerns Stay Safe!  Stay Sane!

## 2019-08-07 ENCOUNTER — Encounter: Payer: Self-pay | Admitting: General Practice

## 2019-08-07 LAB — LIPID PANEL
Cholesterol: 202 mg/dL — ABNORMAL HIGH (ref 0–200)
HDL: 80 mg/dL (ref >39.00)
LDL Cholesterol: 105 mg/dL — ABNORMAL HIGH (ref 0–99)
NonHDL: 121.51
Total CHOL/HDL Ratio: 3
Triglycerides: 82 mg/dL (ref 0.0–149.0)
VLDL: 16.4 mg/dL (ref 0.0–40.0)

## 2019-08-07 LAB — BASIC METABOLIC PANEL
BUN: 11 mg/dL (ref 6–23)
CO2: 29 mEq/L (ref 19–32)
Calcium: 10.3 mg/dL (ref 8.4–10.5)
Chloride: 97 mEq/L (ref 96–112)
Creatinine, Ser: 0.75 mg/dL (ref 0.40–1.20)
GFR: 75 mL/min (ref >60.00)
Glucose, Bld: 87 mg/dL (ref 70–99)
Potassium: 4.5 mEq/L (ref 3.5–5.1)
Sodium: 132 mEq/L — ABNORMAL LOW (ref 135–145)

## 2019-08-07 LAB — CBC WITH DIFFERENTIAL/PLATELET
Basophils Absolute: 0.1 10*3/uL (ref 0.0–0.1)
Basophils Relative: 1.5 % (ref 0.0–3.0)
Eosinophils Absolute: 0 10*3/uL (ref 0.0–0.7)
Eosinophils Relative: 0.8 % (ref 0.0–5.0)
HCT: 37.1 % (ref 36.0–46.0)
Hemoglobin: 12.7 g/dL (ref 12.0–15.0)
Lymphocytes Relative: 14.6 % (ref 12.0–46.0)
Lymphs Abs: 0.9 10*3/uL (ref 0.7–4.0)
MCHC: 34.3 g/dL (ref 30.0–36.0)
MCV: 94.8 fl (ref 78.0–100.0)
Monocytes Absolute: 0.4 10*3/uL (ref 0.1–1.0)
Monocytes Relative: 6.2 % (ref 3.0–12.0)
Neutro Abs: 4.8 10*3/uL (ref 1.4–7.7)
Neutrophils Relative %: 76.9 % (ref 43.0–77.0)
Platelets: 308 10*3/uL (ref 150.0–400.0)
RBC: 3.91 Mil/uL (ref 3.87–5.11)
RDW: 12.4 % (ref 11.5–15.5)
WBC: 6.2 10*3/uL (ref 4.0–10.5)

## 2019-08-07 LAB — HEPATIC FUNCTION PANEL
ALT: 14 U/L (ref 0–35)
AST: 18 U/L (ref 0–37)
Albumin: 4.3 g/dL (ref 3.5–5.2)
Alkaline Phosphatase: 77 U/L (ref 39–117)
Bilirubin, Direct: 0.1 mg/dL (ref 0.0–0.3)
Total Bilirubin: 0.6 mg/dL (ref 0.2–1.2)
Total Protein: 7.6 g/dL (ref 6.0–8.3)

## 2019-08-07 LAB — TSH: TSH: 2.14 u[IU]/mL (ref 0.35–4.50)

## 2019-09-25 DIAGNOSIS — D0512 Intraductal carcinoma in situ of left breast: Secondary | ICD-10-CM | POA: Diagnosis not present

## 2019-12-01 ENCOUNTER — Ambulatory Visit: Payer: Medicare HMO | Attending: Internal Medicine

## 2019-12-01 DIAGNOSIS — Z20822 Contact with and (suspected) exposure to covid-19: Secondary | ICD-10-CM | POA: Diagnosis not present

## 2019-12-02 ENCOUNTER — Other Ambulatory Visit: Payer: Medicare HMO

## 2019-12-02 ENCOUNTER — Ambulatory Visit: Payer: Medicare HMO

## 2019-12-02 LAB — NOVEL CORONAVIRUS, NAA: SARS-CoV-2, NAA: NOT DETECTED

## 2019-12-04 ENCOUNTER — Ambulatory Visit: Payer: Medicare Other | Attending: Internal Medicine

## 2019-12-04 DIAGNOSIS — Z23 Encounter for immunization: Secondary | ICD-10-CM | POA: Insufficient documentation

## 2019-12-04 NOTE — Progress Notes (Signed)
   Covid-19 Vaccination Clinic  Name:  Elizabeth [name]    MRN: [id number] DOB: Jan 29, 1941  12/04/2019  [name] was observed post Covid-19 immunization for 15 minutes without incidence. She was provided with Vaccine Information Sheet and instruction to access the V-Safe system.   [name] was instructed to call 911 with any severe reactions post vaccine: Marland Kitchen Difficulty breathing  . Swelling of your face and throat  . A fast heartbeat  . A bad rash all over your body  . Dizziness and weakness    Immunizations Administered    Name Date Dose VIS Date Route   Pfizer COVID-19 Vaccine 12/04/2019  8:53 AM 0.3 mL 10/31/2019 Intramuscular   Manufacturer: [place]   Lot: [id number]   [place]: [id number]

## 2019-12-25 ENCOUNTER — Ambulatory Visit: Payer: Medicare HMO | Attending: Internal Medicine

## 2019-12-25 DIAGNOSIS — Z23 Encounter for immunization: Secondary | ICD-10-CM | POA: Insufficient documentation

## 2019-12-25 NOTE — Progress Notes (Signed)
   Covid-19 Vaccination Clinic  Name:  Elizabeth [name]    MRN: [id number] DOB: [birth year]  12/25/2019  [name] was observed post Covid-19 immunization for 15 minutes without incidence. She was provided with Vaccine Information Sheet and instruction to access the V-Safe system.   [name] was instructed to call 911 with any severe reactions post vaccine: Marland Kitchen Difficulty breathing  . Swelling of your face and throat  . A fast heartbeat  . A bad rash all over your body  . Dizziness and weakness    Immunizations Administered    Name Date Dose VIS Date Route   Pfizer COVID-19 Vaccine 12/25/2019  9:33 AM 0.3 mL 10/31/2019 Intramuscular   Manufacturer: [place]   Lot: [id number]   [place]: [id number]

## 2019-12-31 ENCOUNTER — Encounter: Payer: Self-pay | Admitting: Family Medicine

## 2019-12-31 MED ORDER — GABAPENTIN 100 MG PO CAPS: 200.0000 mg | ORAL_CAPSULE | Freq: Four times a day (QID) | ORAL | 1 refills | Status: DC

## 2020-01-01 ENCOUNTER — Other Ambulatory Visit: Payer: Self-pay | Admitting: Family Medicine

## 2020-01-29 ENCOUNTER — Other Ambulatory Visit: Payer: Self-pay | Admitting: Adult Health

## 2020-01-29 DIAGNOSIS — Z853 Personal history of malignant neoplasm of breast: Secondary | ICD-10-CM

## 2020-02-17 ENCOUNTER — Encounter: Payer: Self-pay | Admitting: Family Medicine

## 2020-02-17 ENCOUNTER — Telehealth (INDEPENDENT_AMBULATORY_CARE_PROVIDER_SITE_OTHER): Payer: Medicare HMO | Admitting: Family Medicine

## 2020-02-17 DIAGNOSIS — Z87898 Personal history of other specified conditions: Secondary | ICD-10-CM | POA: Diagnosis not present

## 2020-02-17 DIAGNOSIS — R300 Dysuria: Secondary | ICD-10-CM | POA: Diagnosis not present

## 2020-02-17 MED ORDER — CEPHALEXIN 500 MG PO CAPS: 500.0000 mg | ORAL_CAPSULE | Freq: Three times a day (TID) | ORAL | 0 refills | Status: DC

## 2020-02-17 NOTE — Progress Notes (Signed)
Virtual Visit via Video Note  I connected with Elizabeth [name]  on 02/17/20 at 10:00 AM EDT by a video enabled telemedicine application and verified that I am speaking with the correct person using two identifiers.  Location patient: home, [place] Location provider:work or home office Persons participating in the virtual visit: patient, provider  I discussed the limitations of evaluation and management by telemedicine and the availability of in person appointments. The patient expressed understanding and agreed to proceed.   HPI:  Acute visit for Dysuria: -started this morning -symptoms include urgency, burning with urination, bladder pressure -she has had several UTIs in the past, the last one about 1 year a go and this feels the same -has a sensitive stomach, but reports she has tolerated keflex well in the past for this, denies recent abx -Denies fevers, NV, vaginal discharge, bad or flank pain, hematuria    ROS: See pertinent positives and negatives per HPI.  Past Medical History:  Diagnosis Date  . Arthritis   . Cancer ([place])    pre cancer   . Chicken pox   . History of radiation therapy 06/26/17- 07/24/17   Left Breast 50.05 Gy total  . Personal history of radiation therapy   . Shingles 07-2002  . Sleep apnea    mild uses oral devise  . Urinary tract infection     Past Surgical History:  Procedure Laterality Date  . BREAST BIOPSY Bilateral 03/07/2017   benign  . BREAST BIOPSY Right 03/26/2017   benign  . BREAST BIOPSY Left 03/26/2017   malignant  . BREAST LUMPECTOMY Left 05/03/2017  . BREAST LUMPECTOMY WITH RADIOACTIVE SEED LOCALIZATION Left 05/03/2017   Procedure: LEFT BREAST LUMPECTOMY WITH RADIOACTIVE SEED LOCALIZATION;  Surgeon: Jovita Kussmaul, MD;  Location: [place];  Service: General;  Laterality: Left;  . COLONOSCOPY    . HYSTEROSCOPY     Dr Ubaldo Glassing  . SPINAL FUSION  12/01/2016   T10 to Pelvis  . WISDOM TOOTH EXTRACTION     age 83's    Family History  Problem Relation  Age of Onset  . Diabetes Mother   . Arthritis Father   . Cancer Maternal Aunt        liver  . Heart disease Paternal Grandfather   . Colon cancer Neg Hx   . Pancreatic cancer Neg Hx   . Stomach cancer Neg Hx     SOCIAL HX: see hpi   Current Outpatient Medications:  .  Biotin 2.5 MG CAPS, Take 2.5 mg by mouth daily. , Disp: , Rfl:  .  Carboxymethylcellul-Glycerin (LUBRICATING EYE DROPS OP), Apply 1 drop to eye daily as needed (dry eyes)., Disp: , Rfl:  .  Cholecalciferol (VITAMIN D-1000 MAX ST) 25 MCG (1000 UT) tablet, Take by mouth., Disp: , Rfl:  .  gabapentin (NEURONTIN) 100 MG capsule, Take 2 capsules (200 mg total) by mouth 4 (four) times daily., Disp: 240 capsule, Rfl: 1 .  mometasone (ELOCON) 0.1 % cream, Apply 1 application topically daily., Disp: 45 g, Rfl: 0 .  montelukast (SINGULAIR) 10 MG tablet, Take 10 mg by mouth at bedtime., Disp: , Rfl:  .  Multiple Vitamins-Minerals (CENTRUM SILVER 50+WOMEN PO), Take by mouth., Disp: , Rfl:  .  Nutritional Supplements (JUICE PLUS FIBRE PO), Take by mouth. Juice plus fruit 4 tablets daily Juice plus veggie 4 tablets daily, Disp: , Rfl:  .  Probiotic Product (ADVANCED PROBIOTIC 10) CAPS, Take 1 capsule by mouth daily., Disp: , Rfl:  .  cephALEXin (  KEFLEX) 500 MG capsule, Take 1 capsule (500 mg total) by mouth 3 (three) times daily., Disp: 15 capsule, Rfl: 0  EXAM:  VITALS per patient if applicable:  GENERAL: alert, oriented, appears well and in no acute distress  HEENT: atraumatic, conjunttiva clear, no obvious abnormalities on inspection of external nose and ears  NECK: normal movements of the head and neck  LUNGS: on inspection no signs of respiratory distress, breathing rate appears normal, no obvious gross SOB, gasping or wheezing  CV: no obvious cyanosis  MS: moves all visible extremities without noticeable abnormality  PSYCH/NEURO: pleasant and cooperative, no obvious depression or anxiety, speech and thought processing  grossly intact  ASSESSMENT AND PLAN:  Discussed the following assessment and plan:  Dysuria  History of diarrhea  -we discussed possible serious and likely etiologies, options for evaluation and workup, limitations of telemedicine visit vs in person visit, treatment, treatment risks and precautions. Pt prefers to treat via telemedicine empirically rather then risking or undertaking an in person visit at this moment. Suspect possible UTI. Opted to treat empirically with Keflex as she has tolerated this well in the past per her report. Reviewed last culture data.  Patient agrees to seek prompt in person care if worsening, new symptoms arise, or if is not improving with treatment.   I discussed the assessment and treatment plan with the patient. The patient was provided an opportunity to ask questions and all were answered. The patient agreed with the plan and demonstrated an understanding of the instructions.   The patient was advised to call back or seek an in-person evaluation if the symptoms worsen or if the condition fails to improve as anticipated.   Lucretia Kern, DO

## 2020-02-17 NOTE — Patient Instructions (Signed)
-  I sent the medication(s) we discussed to your pharmacy: Meds ordered this encounter  Medications  . cephALEXin (KEFLEX) 500 MG capsule    Sig: Take 1 capsule (500 mg total) by mouth 3 (three) times daily.    Dispense:  15 capsule    Refill:  0    Please let us know if you have any questions or concerns regarding this prescription.  I hope you are feeling better soon! Seek care promptly if your symptoms worsen, new concerns arise or you are not improving with treatment.

## 2020-02-18 MED ORDER — MONTELUKAST SODIUM 10 MG PO TABS: 10.0000 mg | ORAL_TABLET | Freq: Every day | ORAL | 2 refills | Status: DC

## 2020-02-28 ENCOUNTER — Other Ambulatory Visit: Payer: Self-pay | Admitting: Family Medicine

## 2020-03-08 IMAGING — MG DIGITAL DIAGNOSTIC BILATERAL MAMMOGRAM WITH TOMO AND CAD
6 of 10 series · 6 of 26 positions shown · non-contrast
Comparison: Previous exam(s).

CLINICAL DATA: History of LEFT breast cancer in 9958 status post
lumpectomy and radiation therapy.History of additional benign RIGHT
breast biopsies in 9958.

EXAM:
DIGITAL DIAGNOSTIC BILATERAL MAMMOGRAM WITH CAD AND TOMO

[L CC (1 of 2)]
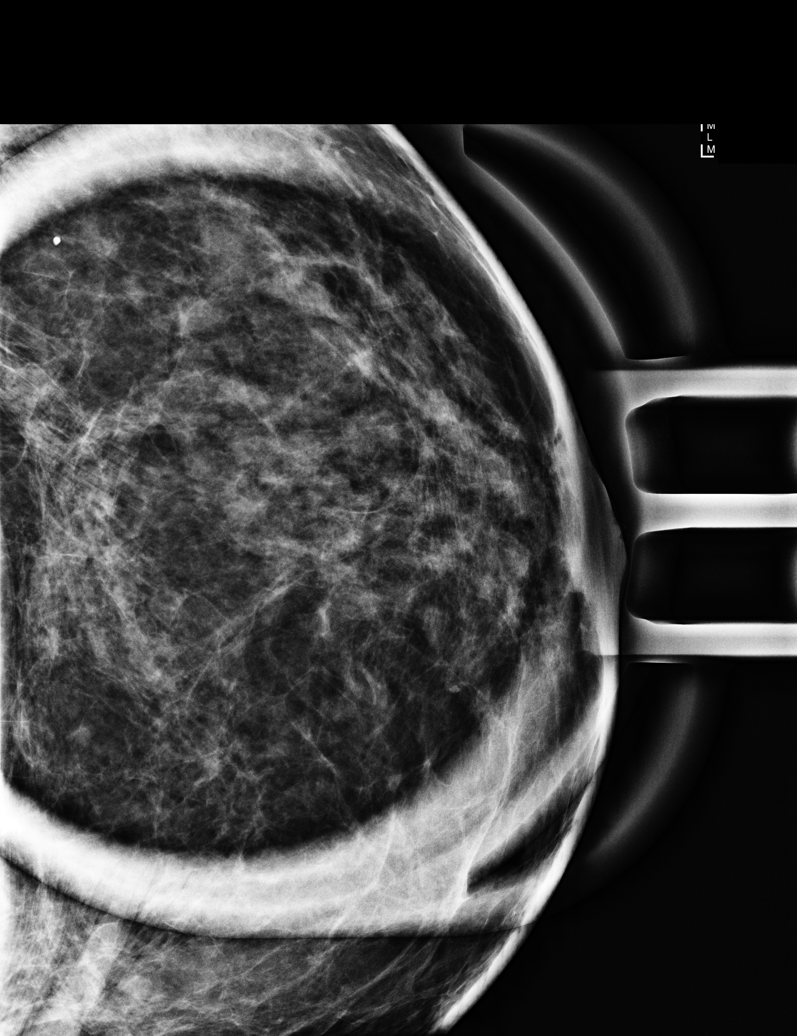

[L CC (2 of 2)]
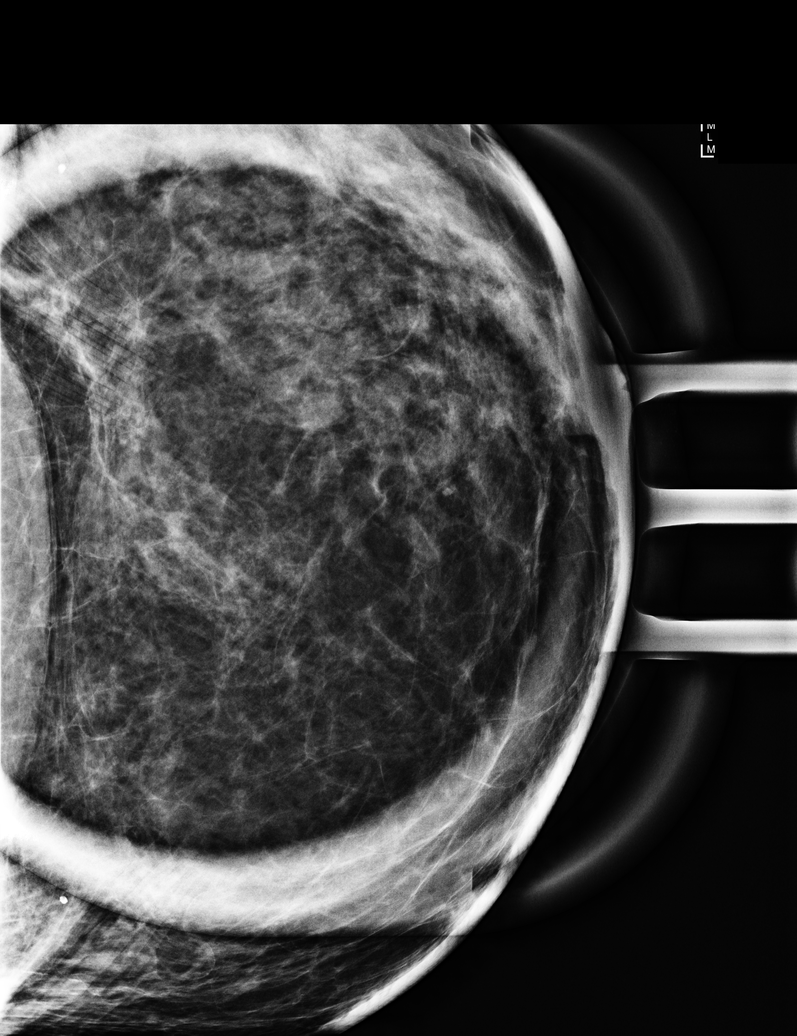

[L MLO synth-2D]
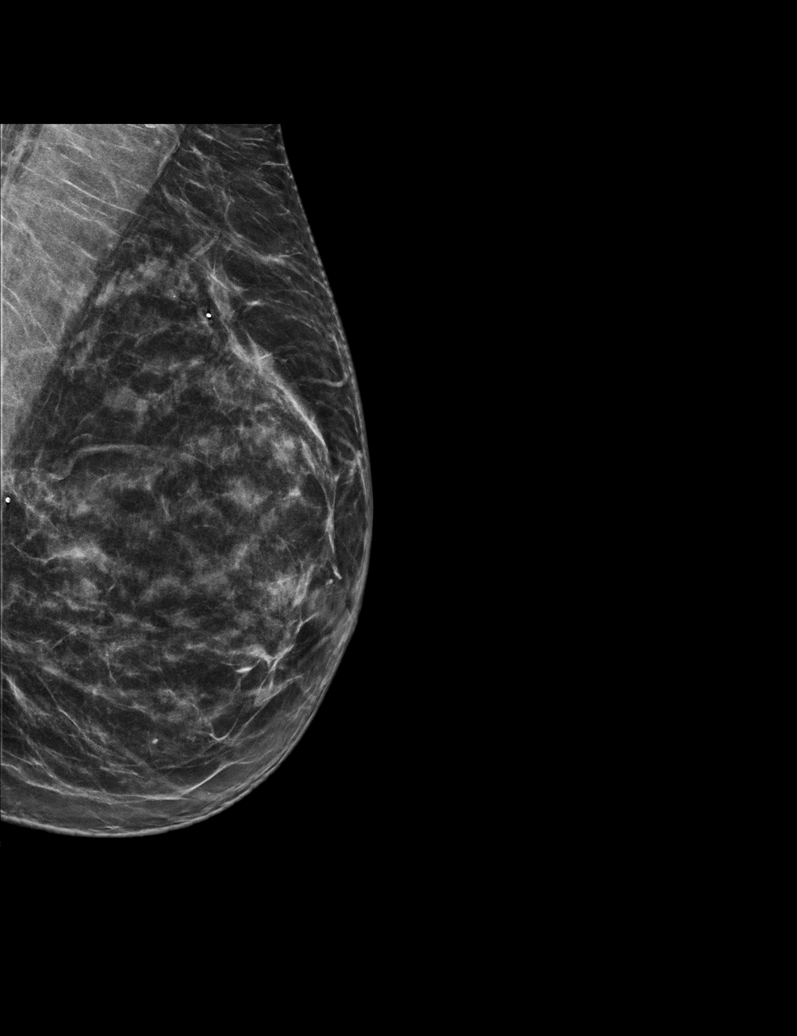

[R MLO synth-2D]
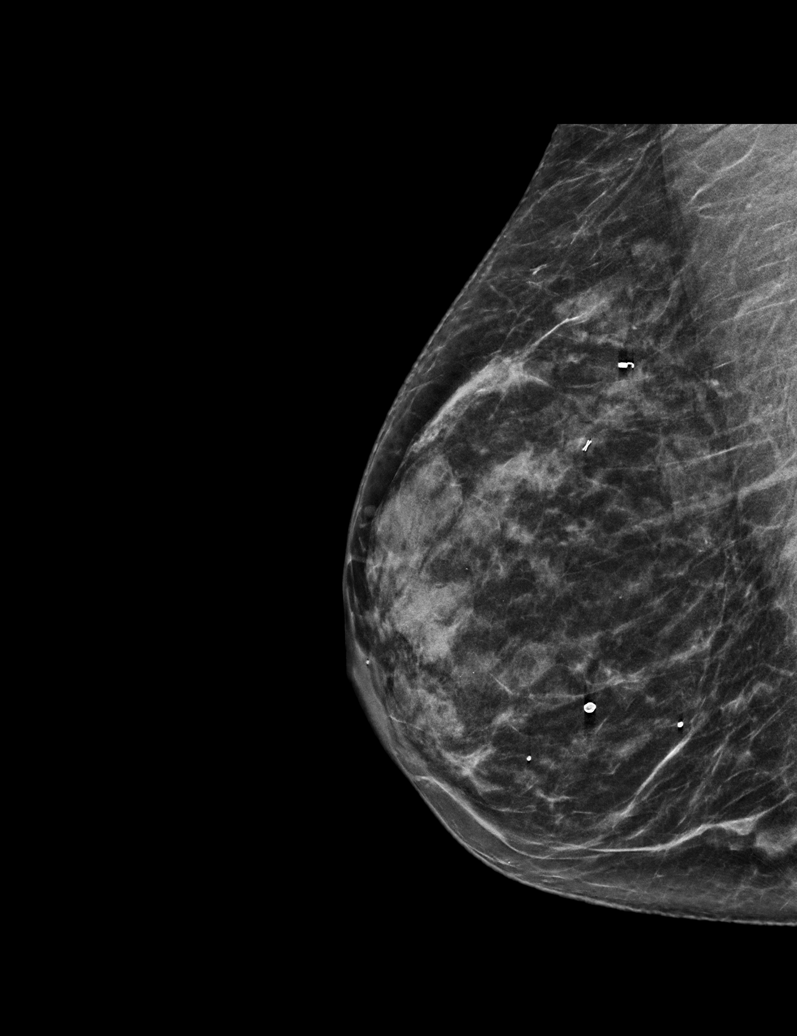

[L CC synth-2D]
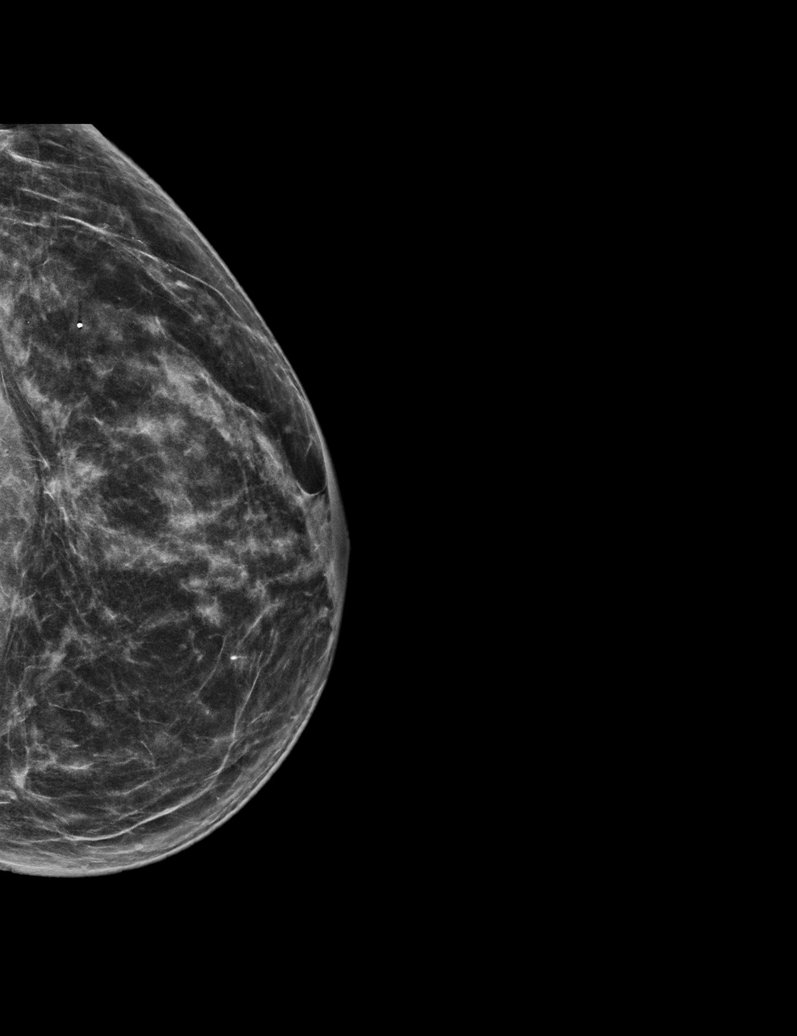

[R CC synth-2D]
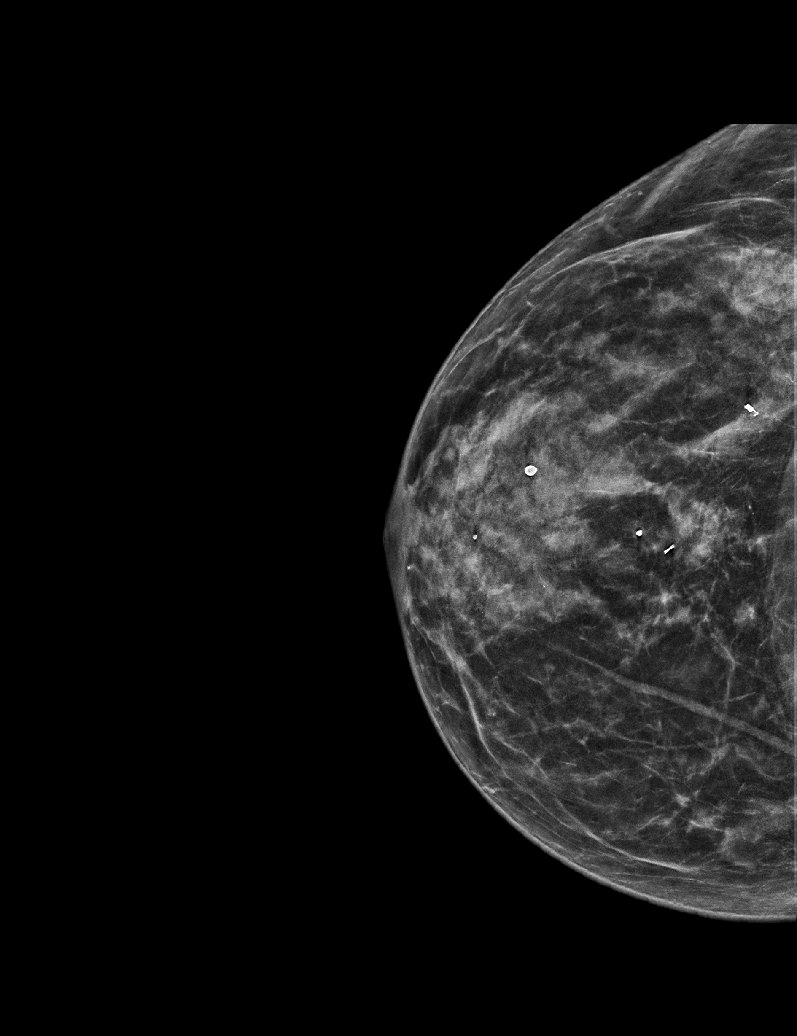

[6 of 26 positions shown; findings below may reference images not displayed]

ACR Breast Density Category c: The breast tissue is heterogeneously
dense, which may obscure small masses.
FINDINGS: There are stable postsurgical changes within the LEFT breast. There
are no new dominant masses, suspicious calcifications or secondary
signs of malignancy within either breast. Biopsy site markers within
the RIGHT breast are stable in position.

Mammographic images were processed with CAD.
IMPRESSION: No evidence of malignancy within either breast. Stable postsurgical
changes within the LEFT breast.

RECOMMENDATION:
Bilateral diagnostic mammogram in 1 year.

I have discussed the findings and recommendations with the patient.
Results were also provided in writing at the conclusion of the
visit. If applicable, a reminder letter will be sent to the patient
regarding the next appointment.

BI-RADS CATEGORY  2: Benign.

## 2020-03-09 ENCOUNTER — Telehealth: Payer: Self-pay | Admitting: Family Medicine

## 2020-03-09 NOTE — Telephone Encounter (Signed)
Left message for patient to schedule Annual Wellness Visit.  Please schedule with Nurse Health Advisor Victoria Britt, RN at  [place] [facility]  

## 2020-03-10 ENCOUNTER — Ambulatory Visit
Admission: RE | Admit: 2020-03-10 | Discharge: 2020-03-10 | Disposition: A | Discharge status: Home / Self Care | Payer: Medicare HMO | Source: Ambulatory Visit | Attending: Adult Health | Admitting: Adult Health

## 2020-03-10 ENCOUNTER — Other Ambulatory Visit: Payer: Self-pay

## 2020-03-10 ENCOUNTER — Other Ambulatory Visit: Payer: Self-pay | Admitting: Adult Health

## 2020-03-10 DIAGNOSIS — Z853 Personal history of malignant neoplasm of breast: Secondary | ICD-10-CM | POA: Diagnosis not present

## 2020-03-10 DIAGNOSIS — N6311 Unspecified lump in the right breast, upper outer quadrant: Secondary | ICD-10-CM | POA: Diagnosis not present

## 2020-03-10 DIAGNOSIS — N6310 Unspecified lump in the right breast, unspecified quadrant: Secondary | ICD-10-CM

## 2020-03-10 DIAGNOSIS — R922 Inconclusive mammogram: Secondary | ICD-10-CM | POA: Diagnosis not present

## 2020-04-15 DIAGNOSIS — D0512 Intraductal carcinoma in situ of left breast: Secondary | ICD-10-CM | POA: Diagnosis not present

## 2020-04-26 ENCOUNTER — Other Ambulatory Visit: Payer: Self-pay | Admitting: Family Medicine

## 2020-04-27 ENCOUNTER — Telehealth: Payer: Self-pay | Admitting: General Practice

## 2020-04-27 NOTE — Telephone Encounter (Signed)
Received a fax from the pharmacy in regards to pt Gabapentin 100mg  . It states that pt has a QTY limit of 3 per day.   Current SIG is Take 2 capsules by mouth 4 times daily.

## 2020-04-28 NOTE — Telephone Encounter (Signed)
PA began in covermymeds 

## 2020-04-28 NOTE — Telephone Encounter (Signed)
Can we do a prior auth to override the quantity limit?  She takes the meds this way b/c the higher doses 3x/day causes sedation but the lower dose more frequently works better for her

## 2020-04-29 ENCOUNTER — Other Ambulatory Visit: Payer: Self-pay | Admitting: Family Medicine

## 2020-05-12 ENCOUNTER — Other Ambulatory Visit: Payer: Self-pay | Admitting: Physician Assistant

## 2020-05-12 NOTE — Telephone Encounter (Signed)
Will defer further refills of patient's medications to PCP  

## 2020-05-17 DIAGNOSIS — L218 Other seborrheic dermatitis: Secondary | ICD-10-CM | POA: Diagnosis not present

## 2020-05-17 DIAGNOSIS — D225 Melanocytic nevi of trunk: Secondary | ICD-10-CM | POA: Diagnosis not present

## 2020-05-17 DIAGNOSIS — D2271 Melanocytic nevi of right lower limb, including hip: Secondary | ICD-10-CM | POA: Diagnosis not present

## 2020-05-17 DIAGNOSIS — L821 Other seborrheic keratosis: Secondary | ICD-10-CM | POA: Diagnosis not present

## 2020-05-17 DIAGNOSIS — L570 Actinic keratosis: Secondary | ICD-10-CM | POA: Diagnosis not present

## 2020-05-17 DIAGNOSIS — D1801 Hemangioma of skin and subcutaneous tissue: Secondary | ICD-10-CM | POA: Diagnosis not present

## 2020-05-19 ENCOUNTER — Ambulatory Visit (INDEPENDENT_AMBULATORY_CARE_PROVIDER_SITE_OTHER): Payer: Medicare HMO | Admitting: Physician Assistant

## 2020-05-19 ENCOUNTER — Encounter: Payer: Self-pay | Admitting: Physician Assistant

## 2020-05-19 ENCOUNTER — Other Ambulatory Visit: Payer: Self-pay

## 2020-05-19 VITALS — BP 118/68 | HR 85 | Temp 98.0°F | Resp 18 | Ht 67.0 in | Wt 144.2 lb

## 2020-05-19 DIAGNOSIS — R319 Hematuria, unspecified: Secondary | ICD-10-CM | POA: Diagnosis not present

## 2020-05-19 DIAGNOSIS — N3001 Acute cystitis with hematuria: Secondary | ICD-10-CM

## 2020-05-19 LAB — POCT URINALYSIS DIPSTICK
Bilirubin, UA: NEGATIVE
Glucose, UA: NEGATIVE
Ketones, UA: POSITIVE
Nitrite, UA: NEGATIVE
Protein, UA: POSITIVE — AB
Spec Grav, UA: 1.02 (ref 1.010–1.025)
Urobilinogen, UA: 0.2 E.U./dL
pH, UA: 6.5 (ref 5.0–8.0)

## 2020-05-19 MED ORDER — CEPHALEXIN 500 MG PO CAPS: 500.0000 mg | ORAL_CAPSULE | Freq: Two times a day (BID) | ORAL | 0 refills | Status: AC

## 2020-05-19 NOTE — Patient Instructions (Signed)
Your symptoms are consistent with a bladder infection, also called acute cystitis. Please take your antibiotic (Keflex) as directed until all pills are gone.  Stay very well hydrated.  Consider a daily probiotic (Align, Culturelle, or Activia) to help prevent stomach upset caused by the antibiotic.  Taking a probiotic daily may also help prevent recurrent UTIs.  Also consider taking AZO (Phenazopyridine) tablets to help decrease pain with urination.  I will call you with your urine testing results.  We will change antibiotics if indicated.  Call or return to clinic if symptoms are not resolved by completion of antibiotic.   Urinary Tract Infection A urinary tract infection (UTI) can occur any place along the urinary tract. The tract includes the kidneys, ureters, bladder, and urethra. A type of germ called bacteria often causes a UTI. UTIs are often helped with antibiotic medicine.  HOME CARE   If given, take antibiotics as told by your doctor. Finish them even if you start to feel better.  Drink enough fluids to keep your pee (urine) clear or pale yellow.  Avoid tea, drinks with caffeine, and bubbly (carbonated) drinks.  Pee often. Avoid holding your pee in for a long time.  Pee before and after having sex (intercourse).  Wipe from front to back after you poop (bowel movement) if you are a woman. Use each tissue only once. GET HELP RIGHT AWAY IF:   You have back pain.  You have lower belly (abdominal) pain.  You have chills.  You feel sick to your stomach (nauseous).  You throw up (vomit).  Your burning or discomfort with peeing does not go away.  You have a fever.  Your symptoms are not better in 3 days. MAKE SURE YOU:   Understand these instructions.  Will watch your condition.  Will get help right away if you are not doing well or get worse. Document Released: 04/24/2008 Document Revised: 07/31/2012 Document Reviewed: 06/06/2012 ExitCare Patient Information 2015  [employer]. This information is not intended to replace advice given to you by your health care provider. Make sure you discuss any questions you have with your health care provider.   

## 2020-05-19 NOTE — Progress Notes (Signed)
Patient presents to clinic today c/o sudden onset of dysuria, urinary urgency, frequency, and urinary hesitancy. Denies fever, chills, nausea or vomiting. Denies flank pain, new onset low back pain or abdominal pain. Denies vaginal pain, discharge. Is not sexually active at present. Has been using a vaginal cream that has caused some irritation to external structures recently.   Past Medical History:  Diagnosis Date  . Arthritis   . Cancer ([place])    pre cancer   . Chicken pox   . History of radiation therapy 06/26/17- 07/24/17   Left Breast 50.05 Gy total  . Personal history of radiation therapy   . Shingles 07-2002  . Sleep apnea    mild uses oral devise  . Urinary tract infection     Current Outpatient Medications on File Prior to Visit  Medication Sig Dispense Refill  . Biotin 2.5 MG CAPS Take 2.5 mg by mouth daily.     . Carboxymethylcellul-Glycerin (LUBRICATING EYE DROPS OP) Apply 1 drop to eye daily as needed (dry eyes).    . cephALEXin (KEFLEX) 500 MG capsule Take 1 capsule (500 mg total) by mouth 3 (three) times daily. 15 capsule 0  . Cholecalciferol (VITAMIN D-1000 MAX ST) 25 MCG (1000 UT) tablet Take by mouth.    . gabapentin (NEURONTIN) 100 MG capsule TAKE 2 CAPSULES BY MOUTH 4 TIMES A DAY 240 capsule 1  . mometasone (ELOCON) 0.1 % cream Apply 1 application topically daily. 45 g 0  . montelukast (SINGULAIR) 10 MG tablet TAKE 1 TABLET BY MOUTH EVERYDAY AT BEDTIME 90 tablet 1  . Multiple Vitamins-Minerals (CENTRUM SILVER 50+WOMEN PO) Take by mouth.    . Nutritional Supplements (JUICE PLUS FIBRE PO) Take by mouth. Juice plus fruit 4 tablets daily Juice plus veggie 4 tablets daily    . Probiotic Product (ADVANCED PROBIOTIC 10) CAPS Take 1 capsule by mouth daily.     No current facility-administered medications on file prior to visit.    Allergies  Allergen Reactions  . No Known Allergies     Family History  Problem Relation Age of Onset  . Diabetes Mother   . Arthritis  Father   . Cancer Maternal Aunt        liver  . Heart disease Paternal Grandfather   . Colon cancer Neg Hx   . Pancreatic cancer Neg Hx   . Stomach cancer Neg Hx     Social History   Socioeconomic History  . Marital status: Married    Spouse name: Not on file  . Number of children: Not on file  . Years of education: Not on file  . Highest education level: Not on file  Occupational History  . Not on file  Tobacco Use  . Smoking status: Former Smoker    Types: Cigarettes    Quit date: 02/18/1981    Years since quitting: 39.2  . Smokeless tobacco: Never Used  Vaping Use  . Vaping Use: Never used  Substance and Sexual Activity  . Alcohol use: Yes    Alcohol/week: 2.0 standard drinks    Types: 2 Glasses of wine per week    Comment: 2 glasses 4 days/week  . Drug use: No  . Sexual activity: Not on file  Other Topics Concern  . Not on file  Social History Narrative  . Not on file   Social Determinants of Health   Financial Resource Strain:   . Difficulty of Paying Living Expenses:   Food Insecurity:   . Worried  About Running Out of Food in the Last Year:   . Queets in the Last Year:   Transportation Needs:   . Lack of Transportation (Medical):   Marland Kitchen Lack of Transportation (Non-Medical):   Physical Activity:   . Days of Exercise per Week:   . Minutes of Exercise per Session:   Stress:   . Feeling of Stress :   Social Connections:   . Frequency of Communication with Friends and Family:   . Frequency of Social Gatherings with Friends and Family:   . Attends Religious Services:   . Active Member of Clubs or Organizations:   . Attends Archivist Meetings:   Marland Kitchen Marital Status:    Review of Systems - See HPI.  All other ROS are negative.  Ht 5\' 7"  (1.702 m)   BMI 22.75 kg/m   Physical Exam Vitals reviewed.  Constitutional:      Appearance: Normal appearance.  HENT:     Head: Normocephalic and atraumatic.  Eyes:     Conjunctiva/sclera:  Conjunctivae normal.  Cardiovascular:     Rate and Rhythm: Normal rate and regular rhythm.     Heart sounds: Normal heart sounds.  Pulmonary:     Effort: Pulmonary effort is normal.  Abdominal:     General: Bowel sounds are normal. There is no distension.     Palpations: Abdomen is soft.     Tenderness: There is no abdominal tenderness. There is no right CVA tenderness or left CVA tenderness.  Musculoskeletal:     Cervical back: Neck supple.  Neurological:     Mental Status: She is alert.    Assessment/Plan: 1. Acute cystitis with hematuria Classic symptoms. Suspected triggered by use of external vaginal cream that has caused irritation She is to dc use of this. Increase fluids. AZO as directed. Will start Keflex 500 mg BID while awaiting culture results. Strict ER precautions reviewed. Will alter treatment according to sensitivities and reponse to initial antibiotic.  - POCT Urinalysis Dipstick - Urine Culture  This visit occurred during the SARS-CoV-2 public health emergency.  Safety protocols were in place, including screening questions prior to the visit, additional usage of staff PPE, and extensive cleaning of exam room while observing appropriate contact time as indicated for disinfecting solutions.     Leeanne Rio, PA-C

## 2020-05-21 LAB — URINE CULTURE
MICRO NUMBER:: 10655950
SPECIMEN QUALITY:: ADEQUATE

## 2020-05-27 DIAGNOSIS — Z01419 Encounter for gynecological examination (general) (routine) without abnormal findings: Secondary | ICD-10-CM | POA: Diagnosis not present

## 2020-06-01 DIAGNOSIS — H5213 Myopia, bilateral: Secondary | ICD-10-CM | POA: Diagnosis not present

## 2020-06-04 ENCOUNTER — Telehealth: Payer: Self-pay | Admitting: Family Medicine

## 2020-06-04 NOTE — Telephone Encounter (Signed)
Left message for patient to schedule Annual Wellness Visit.  Please schedule with Nurse Health Advisor Martha Stanley, RN at [facility]  

## 2020-06-17 DIAGNOSIS — Z0100 Encounter for examination of eyes and vision without abnormal findings: Secondary | ICD-10-CM | POA: Diagnosis not present

## 2020-06-21 ENCOUNTER — Other Ambulatory Visit: Payer: Self-pay | Admitting: Family Medicine

## 2020-06-24 NOTE — Progress Notes (Signed)
Subjective:   Maheen Cwikla is a 79 y.o. female who presents for Medicare Annual (Subsequent) preventive examination.  Review of Systems     Cardiac Risk Factors include: advanced age (>10men, >34 women);dyslipidemia     Objective:    Today's Vitals   06/28/20 0939 06/28/20 0941  BP: 120/78   Pulse: 71   Resp: 16   SpO2: 96%   Weight: 143 lb 12.8 oz (65.2 kg)   Height: 5\' 7"  (1.702 m)   PainSc:  3    Body mass index is 22.52 kg/m.  Advanced Directives 06/28/2020 03/21/2018 08/24/2017 07/19/2017 05/11/2017 04/30/2017 04/04/2017  Does Patient Have a Medical Advance Directive? Yes Yes Yes Yes Yes Yes No  Type of Paramedic of [place];Living will [place];Living will Living will;Healthcare Power of [place];Living will [place];Living will -  Does patient want to make changes to medical advance directive? - - - - - No - Patient declined -  Copy of [place] in Chart? Yes - validated most recent copy scanned in chart (See row information) Yes No - copy requested - Yes No - copy requested -    Current Medications (verified) Outpatient Encounter Medications as of 06/28/2020  Medication Sig  . Biotin 2.5 MG CAPS Take 2.5 mg by mouth daily.   . Carboxymethylcellul-Glycerin (LUBRICATING EYE DROPS OP) Apply 1 drop to eye daily as needed (dry eyes).  . Cholecalciferol (VITAMIN D-1000 MAX ST) 25 MCG (1000 UT) tablet Take by mouth.  . gabapentin (NEURONTIN) 100 MG capsule TAKE 2 CAPSULES BY MOUTH 4 TIMES A DAY  . mometasone (ELOCON) 0.1 % cream Apply 1 application topically daily.  . montelukast (SINGULAIR) 10 MG tablet TAKE 1 TABLET BY MOUTH EVERYDAY AT BEDTIME  . Multiple Vitamins-Minerals (CENTRUM SILVER 50+WOMEN PO) Take by mouth.  . Nutritional Supplements (JUICE PLUS FIBRE PO) Take by mouth. Juice plus fruit 4 tablets daily Juice plus veggie 4 tablets daily  .  Probiotic Product (ADVANCED PROBIOTIC 10) CAPS Take 1 capsule by mouth daily.   No facility-administered encounter medications on file as of 06/28/2020.    Allergies (verified) No known allergies   History: Past Medical History:  Diagnosis Date  . Arthritis   . Cancer ([place])    pre cancer   . Chicken pox   . History of radiation therapy 06/26/17- 07/24/17   Left Breast 50.05 Gy total  . Personal history of radiation therapy   . Shingles 07-2002  . Sleep apnea    mild uses oral devise  . Urinary tract infection    Past Surgical History:  Procedure Laterality Date  . BREAST BIOPSY Bilateral 03/07/2017   benign  . BREAST BIOPSY Right 03/26/2017   benign  . BREAST BIOPSY Left 03/26/2017   malignant  . BREAST LUMPECTOMY Left 05/03/2017  . BREAST LUMPECTOMY WITH RADIOACTIVE SEED LOCALIZATION Left 05/03/2017   Procedure: LEFT BREAST LUMPECTOMY WITH RADIOACTIVE SEED LOCALIZATION;  Surgeon: Jovita Kussmaul, MD;  Location: [facility];  Service: General;  Laterality: Left;  . COLONOSCOPY    . HYSTEROSCOPY     Dr Ubaldo Glassing  . SPINAL FUSION  12/01/2016   T10 to Pelvis  . WISDOM TOOTH EXTRACTION     age 53's   Family History  Problem Relation Age of Onset  . Diabetes Mother   . Arthritis Father   . Cancer Maternal Aunt        liver  .  Heart disease Paternal Grandfather   . Colon cancer Neg Hx   . Pancreatic cancer Neg Hx   . Stomach cancer Neg Hx    Social History   Socioeconomic History  . Marital status: Married    Spouse name: Not on file  . Number of children: Not on file  . Years of education: Not on file  . Highest education level: Not on file  Occupational History  . Occupation: retired  Tobacco Use  . Smoking status: Former Smoker    Types: Cigarettes    Quit date: 02/18/1981    Years since quitting: 39.3  . Smokeless tobacco: Never Used  Vaping Use  . Vaping Use: Never used  Substance and Sexual Activity  . Alcohol use: Yes    Alcohol/week: 2.0 standard drinks     Types: 2 Glasses of wine per week    Comment: 2 glasses 4 days/week  . Drug use: No  . Sexual activity: Not on file  Other Topics Concern  . Not on file  Social History Narrative  . Not on file   Social Determinants of Health   Financial Resource Strain: Low Risk   . Difficulty of Paying Living Expenses: Not hard at all  Food Insecurity: No Food Insecurity  . Worried About Charity fundraiser in the Last Year: Never true  . Ran Out of Food in the Last Year: Never true  Transportation Needs: No Transportation Needs  . Lack of Transportation (Medical): No  . Lack of Transportation (Non-Medical): No  Physical Activity: Sufficiently Active  . Days of Exercise per Week: 3 days  . Minutes of Exercise per Session: 60 min  Stress: No Stress Concern Present  . Feeling of Stress : Not at all  Social Connections: Socially Integrated  . Frequency of Communication with Friends and Family: More than three times a week  . Frequency of Social Gatherings with Friends and Family: More than three times a week  . Attends Religious Services: More than 4 times per year  . Active Member of Clubs or Organizations: Yes  . Attends Archivist Meetings: More than 4 times per year  . Marital Status: Married    Tobacco Counseling Counseling given: Not Answered   Clinical Intake:  Pre-visit preparation completed: Yes  Pain : 0-10 Pain Score: 3  Pain Type: Chronic pain Pain Location: Back Pain Onset: More than a month ago Pain Frequency: Constant Pain Relieving Factors: Pilates  Pain Relieving Factors: Pilates  Nutritional Status: BMI of 19-24  Normal Nutritional Risks: None Diabetes: No  How often do you need to have someone help you when you read instructions, pamphlets, or other written materials from your doctor or pharmacy?: 1 - Never What is the last grade level you completed in school?: College  Diabetic?No  Interpreter Needed?: No  Information entered by :: Caroleen Hamman LPN   Activities of Daily Living In your present state of health, do you have any difficulty performing the following activities: 06/28/2020 08/06/2019  Hearing? N Y  Vision? N N  Difficulty concentrating or making decisions? N N  Walking or climbing stairs? N N  Dressing or bathing? N N  Doing errands, shopping? N N  Preparing Food and eating ? N -  Using the Toilet? N -  In the past six months, have you accidently leaked urine? Y -  Comment wears a panty liner -  Do you have problems with loss of bowel control? N -  Managing  your Medications? N -  Managing your Finances? N -  Housekeeping or managing your Housekeeping? N -  Some recent data might be hidden    Patient Care Team: Midge Minium, MD as PCP - General (Family Medicine) Paula Compton, MD as Consulting Physician (Obstetrics and Gynecology) Gloris Manchester, MD (Neurosurgery) Regal, Tamala Fothergill, DPM as Consulting Physician (Podiatry) Marica Otter, OD (Optometry) Jovita Kussmaul, MD as Consulting Physician (General Surgery) Nicholas Lose, MD as Consulting Physician (Hematology and Oncology) Eppie Gibson, MD as Attending Physician (Radiation Oncology) Gardenia Phlegm, NP as Nurse Practitioner (Hematology and Oncology)  Indicate any recent Medical Services you may have received from other than [facility] providers in the past year (date may be approximate).     Assessment:   This is a routine wellness examination for Kaiyla.  Hearing/Vision screen  Hearing Screening   125Hz  250Hz  500Hz  1000Hz  2000Hz  3000Hz  4000Hz  6000Hz  8000Hz   Right ear:           Left ear:           Comments: Bilateral hearing aids  Vision Screening Comments: Last eye exam 05/2020-Dr Marica Otter  Dietary issues and exercise activities discussed: Current Exercise Habits: Home exercise routine, Type of exercise: Other - see comments;treadmill (pilates), Time (Minutes): 60, Frequency (Times/Week): 3, Weekly Exercise (Minutes/Week):  180, Intensity: Mild  Goals      Patient Stated   .  Maintain (pt-stated)      Maintain current health status by staying active, eating healthy and begin yoga again.       Other   .  Patient Stated      Increase walking after back pain controlled.       Depression Screen PHQ 2/9 Scores 06/28/2020 08/06/2019 05/07/2019 09/25/2018 08/27/2018 03/21/2018 09/19/2017  PHQ - 2 Score 0 0 0 0 0 0 0  PHQ- 9 Score - 0 0 0 0 - 0  Exception Documentation - - - - - - -    Fall Risk Fall Risk  06/28/2020 08/06/2019 05/07/2019 08/27/2018 03/21/2018  Falls in the past year? 0 0 0 No No  Number falls in past yr: 0 0 0 - -  Injury with Fall? 0 0 0 - -  Follow up Falls prevention discussed - - - -    Any stairs in or around the home? Yes  If so, are there any without handrails? No  Home free of loose throw rugs in walkways, pet beds, electrical cords, etc? Yes  Adequate lighting in your home to reduce risk of falls? Yes   ASSISTIVE DEVICES UTILIZED TO PREVENT FALLS:  Life alert? No  Use of a cane, walker or w/c? No  Grab bars in the bathroom? Yes  Shower chair or bench in shower? No  Elevated toilet seat or a handicapped toilet? No   TIMED UP AND GO:  Was the test performed? Yes .  Length of time to ambulate 10 feet: 10 sec.   Gait steady and fast without use of assistive device  Cognitive Function: No cognitive impairment noted. MMSE - Mini Mental State Exam 03/21/2018  Orientation to time 5  Orientation to Place 5  Registration 3  Attention/ Calculation 3  Recall 3  Language- name 2 objects 2  Language- repeat 1  Language- follow 3 step command 3  Language- read & follow direction 1  Write a sentence 1  Copy design 1  Total score 28     6CIT Screen 06/28/2020  What Year? 0  points  What month? 0 points  What time? 0 points  Count back from 20 0 points  Months in reverse 0 points  Repeat phrase 0 points  Total Score 0    Immunizations Immunization History  Administered Date(s)  Administered  . Fluad Quad(high Dose 65+) 08/06/2019  . Influenza, High Dose Seasonal PF 09/12/2013, 09/24/2015, 08/24/2016, 08/08/2018  . Influenza,inj,Quad PF,6+ Mos 08/24/2014, 07/30/2017  . Influenza-Unspecified 09/12/2013  . PFIZER SARS-COV-2 Vaccination 12/04/2019, 12/25/2019  . Pneumococcal Conjugate-13 07/22/2008, 12/17/2014  . Pneumococcal Polysaccharide-23 10/06/2016  . Tetanus 10/08/2013  . Zoster 07/22/2008  . Zoster Recombinat (Shingrix) 11/07/2018, 05/21/2019    TDAP status: Up to date   Flu Vaccine status: Up to date   Pneumococcal vaccine status: Up to date   Covid-19 vaccine status: Completed vaccines  Qualifies for Shingles Vaccine? No   Zostavax completed Yes   Shingrix Completed?: Yes  Screening Tests Health Maintenance  Topic Date Due  . Hepatitis C Screening  Never done  . INFLUENZA VACCINE  06/20/2020  . TETANUS/TDAP  10/09/2023  . DEXA SCAN  Completed  . COVID-19 Vaccine  Completed  . PNA vac Low Risk Adult  Completed    Health Maintenance  Health Maintenance Due  Topic Date Due  . Hepatitis C Screening  Never done  . INFLUENZA VACCINE  06/20/2020    Colorectal cancer screening: No longer required.    Mammogram status: Completed 03/10/2020. Repeat every year   Bone Density status: Ordered today. Pt provided with contact info and advised to call to schedule appt.  Lung Cancer Screening: (Low Dose CT Chest recommended if Age 28-80 years, 30 pack-year currently smoking OR have quit w/in 15years.) does not qualify.     Additional Screening:  Hepatitis C Screening: Discuss with PCP  Vision Screening: Recommended annual ophthalmology exams for early detection of glaucoma and other disorders of the eye. Is the patient up to date with their annual eye exam?  Yes  Who is the provider or what is the name of the office in which the patient attends annual eye exams? Dr Sabra Heck   Dental Screening: Recommended annual dental exams for proper oral  hygiene  Community Resource Referral / Chronic Care Management: CRR required this visit?  No   CCM required this visit?  No      Plan:     I have personally reviewed and noted the following in the patient's chart:   . Medical and social history . Use of alcohol, tobacco or illicit drugs  . Current medications and supplements . Functional ability and status . Nutritional status . Physical activity . Advanced directives . List of other physicians . Hospitalizations, surgeries, and ER visits in previous 12 months . Vitals . Screenings to include cognitive, depression, and falls . Referrals and appointments  In addition, I have reviewed and discussed with patient certain preventive protocols, quality metrics, and best practice recommendations. A written personalized care plan for preventive services as well as general preventive health recommendations were provided to patient.   Patient to access AVS via mychart.  Marta Antu, LPN   05/28/4800  Nurse Health Advisor  Nurse Notes: None

## 2020-06-28 ENCOUNTER — Ambulatory Visit (INDEPENDENT_AMBULATORY_CARE_PROVIDER_SITE_OTHER): Payer: Medicare HMO

## 2020-06-28 ENCOUNTER — Other Ambulatory Visit: Payer: Self-pay

## 2020-06-28 VITALS — BP 120/78 | HR 71 | Resp 16 | Ht 67.0 in | Wt 143.8 lb

## 2020-06-28 DIAGNOSIS — Z780 Asymptomatic menopausal state: Secondary | ICD-10-CM | POA: Diagnosis not present

## 2020-06-28 DIAGNOSIS — Z0000 Encounter for general adult medical examination without abnormal findings: Secondary | ICD-10-CM | POA: Diagnosis not present

## 2020-06-28 NOTE — Patient Instructions (Signed)
Elizabeth [name] , Thank you for taking time to come for your Medicare Wellness Visit. I appreciate your ongoing commitment to your health goals. Please review the following plan we discussed and let me know if I can assist you in the future.   Screening recommendations/referrals: Colonoscopy: No longer required. Mammogram: Completed 03/10/2020-Due-03/10/2021 Bone Density: Ordered today. Someone will be calling you to schedule an appointment. Recommended yearly ophthalmology/optometry visit for glaucoma screening and checkup Recommended yearly dental visit for hygiene and checkup  Vaccinations: Influenza vaccine: Due 07/2020 Pneumococcal vaccine: Completed vaccines Tdap vaccine: Completed 10/07/2013- Due-10/09/2023 Shingles vaccine: Completed vaccine  Covid-19:Completed vaccine  Advanced directives: Copy on file  Conditions/risks identified: See problem list  Next appointment: Follow up in one year for your annual wellness visit.   Preventive Care 77 Years and Older, Female Preventive care refers to lifestyle choices and visits with your health care provider that can promote health and wellness. What does preventive care include?  A yearly physical exam. This is also called an annual well check.  Dental exams once or twice a year.  Routine eye exams. Ask your health care provider how often you should have your eyes checked.  Personal lifestyle choices, including:  Daily care of your teeth and gums.  Regular physical activity.  Eating a healthy diet.  Avoiding tobacco and drug use.  Limiting alcohol use.  Practicing safe sex.  Taking low-dose aspirin every day.  Taking vitamin and mineral supplements as recommended by your health care provider. What happens during an annual well check? The services and screenings done by your health care provider during your annual well check will depend on your age, overall health, lifestyle risk factors, and family history of  disease. Counseling  Your health care provider may ask you questions about your:  Alcohol use.  Tobacco use.  Drug use.  Emotional well-being.  Home and relationship well-being.  Sexual activity.  Eating habits.  History of falls.  Memory and ability to understand (cognition).  Work and work Statistician.  Reproductive health. Screening  You may have the following tests or measurements:  Height, weight, and BMI.  Blood pressure.  Lipid and cholesterol levels. These may be checked every 5 years, or more frequently if you are over 87 years old.  Skin check.  Lung cancer screening. You may have this screening every year starting at age 42 if you have a 30-pack-year history of smoking and currently smoke or have quit within the past 15 years.  Fecal occult blood test (FOBT) of the stool. You may have this test every year starting at age 33.  Flexible sigmoidoscopy or colonoscopy. You may have a sigmoidoscopy every 5 years or a colonoscopy every 10 years starting at age 82.  Hepatitis C blood test.  Hepatitis B blood test.  Sexually transmitted disease (STD) testing.  Diabetes screening. This is done by checking your blood sugar (glucose) after you have not eaten for a while (fasting). You may have this done every 1-3 years.  Bone density scan. This is done to screen for osteoporosis. You may have this done starting at age 63.  Mammogram. This may be done every 1-2 years. Talk to your health care provider about how often you should have regular mammograms. Talk with your health care provider about your test results, treatment options, and if necessary, the need for more tests. Vaccines  Your health care provider may recommend certain vaccines, such as:  Influenza vaccine. This is recommended every year.  Tetanus, diphtheria,  and acellular pertussis (Tdap, Td) vaccine. You may need a Td booster every 10 years.  Zoster vaccine. You may need this after age  72.  Pneumococcal 13-valent conjugate (PCV13) vaccine. One dose is recommended after age 48.  Pneumococcal polysaccharide (PPSV23) vaccine. One dose is recommended after age 49. Talk to your health care provider about which screenings and vaccines you need and how often you need them. This information is not intended to replace advice given to you by your health care provider. Make sure you discuss any questions you have with your health care provider. Document Released: 12/03/2015 Document Revised: 07/26/2016 Document Reviewed: 09/07/2015 Elsevier Interactive Patient Education  2017 [place] Prevention in the Home Falls can cause injuries. They can happen to people of all ages. There are many things you can do to make your home safe and to help prevent falls. What can I do on the outside of my home?  Regularly fix the edges of walkways and driveways and fix any cracks.  Remove anything that might make you trip as you walk through a door, such as a raised step or threshold.  Trim any bushes or trees on the path to your home.  Use bright outdoor lighting.  Clear any walking paths of anything that might make someone trip, such as rocks or tools.  Regularly check to see if handrails are loose or broken. Make sure that both sides of any steps have handrails.  Any raised decks and porches should have guardrails on the edges.  Have any leaves, snow, or ice cleared regularly.  Use sand or salt on walking paths during winter.  Clean up any spills in your garage right away. This includes oil or grease spills. What can I do in the bathroom?  Use night lights.  Install grab bars by the toilet and in the tub and shower. Do not use towel bars as grab bars.  Use non-skid mats or decals in the tub or shower.  If you need to sit down in the shower, use a plastic, non-slip stool.  Keep the floor dry. Clean up any water that spills on the floor as soon as it happens.  Remove  soap buildup in the tub or shower regularly.  Attach bath mats securely with double-sided non-slip rug tape.  Do not have throw rugs and other things on the floor that can make you trip. What can I do in the bedroom?  Use night lights.  Make sure that you have a light by your bed that is easy to reach.  Do not use any sheets or blankets that are too big for your bed. They should not hang down onto the floor.  Have a firm chair that has side arms. You can use this for support while you get dressed.  Do not have throw rugs and other things on the floor that can make you trip. What can I do in the kitchen?  Clean up any spills right away.  Avoid walking on wet floors.  Keep items that you use a lot in easy-to-reach places.  If you need to reach something above you, use a strong step stool that has a grab bar.  Keep electrical cords out of the way.  Do not use floor polish or wax that makes floors slippery. If you must use wax, use non-skid floor wax.  Do not have throw rugs and other things on the floor that can make you trip. What can I do with my  stairs?  Do not leave any items on the stairs.  Make sure that there are handrails on both sides of the stairs and use them. Fix handrails that are broken or loose. Make sure that handrails are as long as the stairways.  Check any carpeting to make sure that it is firmly attached to the stairs. Fix any carpet that is loose or worn.  Avoid having throw rugs at the top or bottom of the stairs. If you do have throw rugs, attach them to the floor with carpet tape.  Make sure that you have a light switch at the top of the stairs and the bottom of the stairs. If you do not have them, ask someone to add them for you. What else can I do to help prevent falls?  Wear shoes that:  Do not have high heels.  Have rubber bottoms.  Are comfortable and fit you well.  Are closed at the toe. Do not wear sandals.  If you use a  stepladder:  Make sure that it is fully opened. Do not climb a closed stepladder.  Make sure that both sides of the stepladder are locked into place.  Ask someone to hold it for you, if possible.  Clearly mark and make sure that you can see:  Any grab bars or handrails.  First and last steps.  Where the edge of each step is.  Use tools that help you move around (mobility aids) if they are needed. These include:  Canes.  Walkers.  Scooters.  Crutches.  Turn on the lights when you go into a dark area. Replace any light bulbs as soon as they burn out.  Set up your furniture so you have a clear path. Avoid moving your furniture around.  If any of your floors are uneven, fix them.  If there are any pets around you, be aware of where they are.  Review your medicines with your doctor. Some medicines can make you feel dizzy. This can increase your chance of falling. Ask your doctor what other things that you can do to help prevent falls. This information is not intended to replace advice given to you by your health care provider. Make sure you discuss any questions you have with your health care provider. Document Released: 09/02/2009 Document Revised: 04/13/2016 Document Reviewed: 12/11/2014 Elsevier Interactive Patient Education  2017 [employer].

## 2020-07-07 ENCOUNTER — Other Ambulatory Visit: Payer: Self-pay | Admitting: Family Medicine

## 2020-07-07 DIAGNOSIS — Z780 Asymptomatic menopausal state: Secondary | ICD-10-CM

## 2020-08-09 ENCOUNTER — Ambulatory Visit (INDEPENDENT_AMBULATORY_CARE_PROVIDER_SITE_OTHER): Payer: Medicare HMO | Admitting: Family Medicine

## 2020-08-09 ENCOUNTER — Encounter: Payer: Self-pay | Admitting: Family Medicine

## 2020-08-09 ENCOUNTER — Other Ambulatory Visit: Payer: Self-pay

## 2020-08-09 VITALS — BP 121/79 | HR 80 | Temp 97.9°F | Resp 16 | Ht 67.0 in | Wt 144.0 lb

## 2020-08-09 DIAGNOSIS — Z23 Encounter for immunization: Secondary | ICD-10-CM | POA: Diagnosis not present

## 2020-08-09 DIAGNOSIS — R1314 Dysphagia, pharyngoesophageal phase: Secondary | ICD-10-CM

## 2020-08-09 DIAGNOSIS — E785 Hyperlipidemia, unspecified: Secondary | ICD-10-CM

## 2020-08-09 DIAGNOSIS — Z0000 Encounter for general adult medical examination without abnormal findings: Secondary | ICD-10-CM | POA: Diagnosis not present

## 2020-08-09 LAB — CBC WITH DIFFERENTIAL/PLATELET
Basophils Absolute: 0 10*3/uL (ref 0.0–0.1)
Basophils Relative: 1.2 % (ref 0.0–3.0)
Eosinophils Absolute: 0.1 10*3/uL (ref 0.0–0.7)
Eosinophils Relative: 2 % (ref 0.0–5.0)
HCT: 38.5 % (ref 36.0–46.0)
Hemoglobin: 13 g/dL (ref 12.0–15.0)
Lymphocytes Relative: 19.7 % (ref 12.0–46.0)
Lymphs Abs: 0.8 10*3/uL (ref 0.7–4.0)
MCHC: 33.7 g/dL (ref 30.0–36.0)
MCV: 96.6 fl (ref 78.0–100.0)
Monocytes Absolute: 0.4 10*3/uL (ref 0.1–1.0)
Monocytes Relative: 11 % (ref 3.0–12.0)
Neutro Abs: 2.6 10*3/uL (ref 1.4–7.7)
Neutrophils Relative %: 66.1 % (ref 43.0–77.0)
Platelets: 285 10*3/uL (ref 150.0–400.0)
RBC: 3.98 Mil/uL (ref 3.87–5.11)
RDW: 12.6 % (ref 11.5–15.5)
WBC: 3.9 10*3/uL — ABNORMAL LOW (ref 4.0–10.5)

## 2020-08-09 LAB — LIPID PANEL
Cholesterol: 221 mg/dL — ABNORMAL HIGH (ref 0–200)
HDL: 90.7 mg/dL (ref >39.00)
LDL Cholesterol: 112 mg/dL — ABNORMAL HIGH (ref 0–99)
NonHDL: 130.51
Total CHOL/HDL Ratio: 2
Triglycerides: 95 mg/dL (ref 0.0–149.0)
VLDL: 19 mg/dL (ref 0.0–40.0)

## 2020-08-09 LAB — HEPATIC FUNCTION PANEL
ALT: 14 U/L (ref 0–35)
AST: 19 U/L (ref 0–37)
Albumin: 4.4 g/dL (ref 3.5–5.2)
Alkaline Phosphatase: 63 U/L (ref 39–117)
Bilirubin, Direct: 0.1 mg/dL (ref 0.0–0.3)
Total Bilirubin: 0.7 mg/dL (ref 0.2–1.2)
Total Protein: 7.5 g/dL (ref 6.0–8.3)

## 2020-08-09 LAB — BASIC METABOLIC PANEL
BUN: 10 mg/dL (ref 6–23)
CO2: 29 mEq/L (ref 19–32)
Calcium: 9.9 mg/dL (ref 8.4–10.5)
Chloride: 102 mEq/L (ref 96–112)
Creatinine, Ser: 0.7 mg/dL (ref 0.40–1.20)
GFR: 81 mL/min (ref >60.00)
Glucose, Bld: 106 mg/dL — ABNORMAL HIGH (ref 70–99)
Potassium: 4.7 mEq/L (ref 3.5–5.1)
Sodium: 137 mEq/L (ref 135–145)

## 2020-08-09 LAB — TSH: TSH: 2.24 u[IU]/mL (ref 0.35–4.50)

## 2020-08-09 MED ORDER — GABAPENTIN 300 MG PO CAPS: 300.0000 mg | ORAL_CAPSULE | Freq: Four times a day (QID) | ORAL | 1 refills | Status: DC

## 2020-08-09 NOTE — Progress Notes (Signed)
Subjective:    Patient ID: Elizabeth [name], female    DOB: 03-Jan-1941, 79 y.o.   MRN: [id number]  HPI CPE- UTD on immunizations (will get flu shot today), colonoscopy, mammogram.  Reviewed past medical, surgical, family and social histories.   Health Maintenance  Topic Date Due  . INFLUENZA VACCINE  06/20/2020  . Hepatitis C Screening  08/09/2021 (Originally [birth year])  . TETANUS/TDAP  10/09/2023  . DEXA SCAN  Completed  . COVID-19 Vaccine  Completed  . PNA vac Low Risk Adult  Completed     Patient Care Team    Relationship Specialty Notifications Start End  Midge Minium, MD PCP - General Family Medicine  12/26/12   Paula Compton, MD Consulting Physician Obstetrics and Gynecology  03/01/15   Gloris Manchester, MD  Neurosurgery  03/15/17   Wallene Huh, DPM Consulting Physician Podiatry  03/15/17   Marica Otter, OD  Optometry  03/15/17   Jovita Kussmaul, MD Consulting Physician General Surgery  04/03/17   Nicholas Lose, MD Consulting Physician Hematology and Oncology  04/03/17   Eppie Gibson, MD Attending Physician Radiation Oncology  04/03/17   Gardenia Phlegm, NP Nurse Practitioner Hematology and Oncology  08/15/17       Review of Systems Patient reports no vision/ hearing changes, adenopathy,fever, weight change,  persistant/recurrent hoarseness, chest pain, palpitations, edema, persistant/recurrent cough, hemoptysis, dyspnea (rest/exertional/paroxysmal nocturnal), gastrointestinal bleeding (melena, rectal bleeding), abdominal pain, significant heartburn, bowel changes, GU symptoms (dysuria, hematuria, incontinence), Gyn symptoms (abnormal  bleeding, pain),  syncope, focal weakness, memory loss, numbness & tingling, skin/hair/nail changes, abnormal bruising or bleeding, anxiety, or depression.   Dysphagia- having difficulty swallowing pills, dad required esophageal dilation.  This visit occurred during the SARS-CoV-2 public health emergency.  Safety  protocols were in place, including screening questions prior to the visit, additional usage of staff PPE, and extensive cleaning of exam room while observing appropriate contact time as indicated for disinfecting solutions.       Objective:   Physical Exam General Appearance:    Alert, cooperative, no distress, appears stated age  Head:    Normocephalic, without obvious abnormality, atraumatic  Eyes:    PERRL, conjunctiva/corneas clear, EOM's intact, fundi    benign, both eyes  Ears:    Normal TM's and external ear canals, both ears  Nose:   Deferred due to COVID  Throat:   Neck:   Supple, symmetrical, trachea midline, no adenopathy;    Thyroid: no enlargement/tenderness/nodules  Back:     Symmetric, no curvature, ROM normal, no CVA tenderness  Lungs:     Clear to auscultation bilaterally, respirations unlabored  Chest Wall:    No tenderness or deformity   Heart:    Regular rate and rhythm, S1 and S2 normal, no murmur, rub   or gallop  Breast Exam:    Deferred to GYN  Abdomen:     Soft, non-tender, bowel sounds active all four quadrants,    no masses, no organomegaly  Genitalia:    Deferred to GYN  Rectal:    Extremities:   Extremities normal, atraumatic, no cyanosis or edema  Pulses:   2+ and symmetric all extremities  Skin:   Skin color, texture, turgor normal, no rashes or lesions  Lymph nodes:   Cervical, supraclavicular, and axillary nodes normal  Neurologic:   CNII-XII intact, normal strength, sensation and reflexes    throughout          Assessment & Plan:  Dysphagia- new.  Pt reports sxs have been going on for 'at least a year'.  Dad required esophageal dilation.  Referral placed to GI.

## 2020-08-09 NOTE — Assessment & Plan Note (Signed)
Pt's PE WNL.  UTD on GYN, immunizations, colonoscopy- flu shot given today.  Check labs.  Anticipatory guidance provided.

## 2020-08-09 NOTE — Patient Instructions (Addendum)
Follow up in 1 year or as needed We'll notify you of your lab results and make any changes if needed We'll call you with your GI referral Get your COVID booster 6 months after your last shot Call with any questions or concerns Stay Safe!  Stay Healthy!

## 2020-08-09 NOTE — Addendum Note (Signed)
Addended by: Fritz Pickerel on: 08/09/2020 10:55 AM   Modules accepted: Orders

## 2020-08-26 ENCOUNTER — Encounter: Payer: Self-pay | Admitting: Internal Medicine

## 2020-08-27 ENCOUNTER — Telehealth: Payer: Self-pay | Admitting: Family Medicine

## 2020-08-27 ENCOUNTER — Encounter: Payer: Self-pay | Admitting: Physician Assistant

## 2020-08-27 ENCOUNTER — Telehealth (INDEPENDENT_AMBULATORY_CARE_PROVIDER_SITE_OTHER): Payer: Medicare HMO | Admitting: Physician Assistant

## 2020-08-27 DIAGNOSIS — R300 Dysuria: Secondary | ICD-10-CM

## 2020-08-27 MED ORDER — CEPHALEXIN 500 MG PO CAPS: 500.0000 mg | ORAL_CAPSULE | Freq: Two times a day (BID) | ORAL | 0 refills | Status: AC

## 2020-08-27 NOTE — Telephone Encounter (Signed)
Please advise 

## 2020-08-27 NOTE — Telephone Encounter (Signed)
I have scheduled pt with Elizabeth [name] today at 2pm.

## 2020-08-27 NOTE — Telephone Encounter (Signed)
Pt called in stating that she has a UTI, she wanted to know if Dr.Tabori would be willing to call her in something.   No providers have avail. appts with in [facility] today.  Pt can be reached at the home #

## 2020-08-27 NOTE — Patient Instructions (Signed)
Instructions sent to patients MyChart.

## 2020-08-27 NOTE — Progress Notes (Signed)
Virtual Visit via Video   I connected with patient on 08/27/20 at  2:00 PM EDT by a video enabled telemedicine application and verified that I am speaking with the correct person using two identifiers.  Location patient: Home Location provider: [facility], Office Persons participating in the virtual visit: Patient, Provider, [place] (Patina Moore)  I discussed the limitations of evaluation and management by telemedicine and the availability of in person appointments. The patient expressed understanding and agreed to proceed.  Subjective:   HPI:   Patient presents via [place] today urinary urgency, pressure, dysuria and small amount of hematuria x 2 days. Endorses frequency and hesitancy today. Denies fever, chills, nausea or vomiting. Denies flank pain or new onset back pain. Denies vaginal symptoms or bowel habits. Has been trying to stay well-hydrated.  Husband has gone to get her some AZO to take.   ROS:   See pertinent positives and negatives per HPI.  Patient Active Problem List   Diagnosis Date Noted  . Sensorineural hearing loss (SNHL) of both ears 06/24/2019  . Malignant neoplasm of lower-outer quadrant of left breast of female, estrogen receptor positive ([place]) 04/03/2017  . Apnea, sleep 11/03/2016  . Lumbar back pain 12/23/2015  . Routine general medical examination at a health care facility 10/08/2013  . Chronic back pain 12/26/2012  . Blood glucose elevated 12/26/2012    Social History   Tobacco Use  . Smoking status: Former Smoker    Types: Cigarettes    Quit date: 02/18/1981    Years since quitting: 39.5  . Smokeless tobacco: Never Used  Substance Use Topics  . Alcohol use: Yes    Alcohol/week: 2.0 standard drinks    Types: 2 Glasses of wine per week    Comment: 2 glasses 4 days/week    Current Outpatient Medications:  .  Biotin 2.5 MG CAPS, Take 2.5 mg by mouth daily. , Disp: , Rfl:  .  Carboxymethylcellul-Glycerin (LUBRICATING EYE DROPS OP),  Apply 1 drop to eye daily as needed (dry eyes)., Disp: , Rfl:  .  Cholecalciferol (VITAMIN D-1000 MAX ST) 25 MCG (1000 UT) tablet, Take by mouth., Disp: , Rfl:  .  gabapentin (NEURONTIN) 300 MG capsule, Take 1 capsule (300 mg total) by mouth 4 (four) times daily., Disp: 360 capsule, Rfl: 1 .  mometasone (ELOCON) 0.1 % cream, Apply 1 application topically daily., Disp: 45 g, Rfl: 0 .  montelukast (SINGULAIR) 10 MG tablet, TAKE 1 TABLET BY MOUTH EVERYDAY AT BEDTIME, Disp: 90 tablet, Rfl: 1 .  Multiple Vitamins-Minerals (CENTRUM SILVER 50+WOMEN PO), Take by mouth., Disp: , Rfl:  .  Nutritional Supplements (JUICE PLUS FIBRE PO), Take by mouth. Juice plus fruit 4 tablets daily Juice plus veggie 4 tablets daily, Disp: , Rfl:  .  Probiotic Product (ADVANCED PROBIOTIC 10) CAPS, Take 1 capsule by mouth daily., Disp: , Rfl:   Allergies  Allergen Reactions  . No Known Allergies     Objective:   There were no vitals taken for this visit.  Patient is well-developed, well-nourished in no acute distress.  Resting comfortably at home.  Head is normocephalic, atraumatic.  No labored breathing.  Speech is clear and coherent with logical content.  Patient is alert and oriented at baseline.   Assessment and Plan:   1. Dysuria Classic UTI symptoms. No alarm signs or symptoms present. Increase fluids. OTC medications and supportive measures reviewed. Rx Keflex 500 mg BID x 7 days. If not improving she will need in-office assessment with  UA/Culture. Strict ER precautions discussed with patient.     Leeanne Rio, PA-C 08/27/2020

## 2020-10-13 ENCOUNTER — Other Ambulatory Visit: Payer: Medicare HMO

## 2020-10-27 ENCOUNTER — Telehealth: Payer: Self-pay | Admitting: Family Medicine

## 2020-10-28 NOTE — Telephone Encounter (Signed)
Called patient in reference to medication. I informed her the Gabapentin was refused because it was an old prescription and Dr. Birdie Riddle had increased her dosage. Patient voiced understanding.

## 2020-10-28 NOTE — Telephone Encounter (Signed)
Patient would like a call back in regard to why medication was denied.    Patient can be reached at [phone].

## 2020-11-02 ENCOUNTER — Encounter: Payer: Self-pay | Admitting: Nurse Practitioner

## 2020-11-02 ENCOUNTER — Other Ambulatory Visit (INDEPENDENT_AMBULATORY_CARE_PROVIDER_SITE_OTHER): Payer: Medicare HMO

## 2020-11-02 ENCOUNTER — Ambulatory Visit: Payer: Medicare HMO | Admitting: Internal Medicine

## 2020-11-02 ENCOUNTER — Ambulatory Visit: Payer: Medicare HMO | Admitting: Nurse Practitioner

## 2020-11-02 ENCOUNTER — Other Ambulatory Visit: Payer: Self-pay

## 2020-11-02 VITALS — BP 110/76 | HR 82 | Ht 67.0 in | Wt 144.0 lb

## 2020-11-02 DIAGNOSIS — R1310 Dysphagia, unspecified: Secondary | ICD-10-CM

## 2020-11-02 DIAGNOSIS — A09 Infectious gastroenteritis and colitis, unspecified: Secondary | ICD-10-CM

## 2020-11-02 DIAGNOSIS — R197 Diarrhea, unspecified: Secondary | ICD-10-CM

## 2020-11-02 LAB — COMPREHENSIVE METABOLIC PANEL
ALT: 15 U/L (ref 0–35)
AST: 21 U/L (ref 0–37)
Albumin: 4.4 g/dL (ref 3.5–5.2)
Alkaline Phosphatase: 62 U/L (ref 39–117)
BUN: 9 mg/dL (ref 6–23)
CO2: 27 mEq/L (ref 19–32)
Calcium: 9.8 mg/dL (ref 8.4–10.5)
Chloride: 99 mEq/L (ref 96–112)
Creatinine, Ser: 0.82 mg/dL (ref 0.40–1.20)
GFR: 68.77 mL/min (ref >60.00)
Glucose, Bld: 97 mg/dL (ref 70–99)
Potassium: 4.1 mEq/L (ref 3.5–5.1)
Sodium: 133 mEq/L — ABNORMAL LOW (ref 135–145)
Total Bilirubin: 0.7 mg/dL (ref 0.2–1.2)
Total Protein: 8 g/dL (ref 6.0–8.3)

## 2020-11-02 LAB — CBC WITH DIFFERENTIAL/PLATELET
Basophils Absolute: 0 10*3/uL (ref 0.0–0.1)
Basophils Relative: 0.7 % (ref 0.0–3.0)
Eosinophils Absolute: 0.1 10*3/uL (ref 0.0–0.7)
Eosinophils Relative: 1.2 % (ref 0.0–5.0)
HCT: 37.9 % (ref 36.0–46.0)
Hemoglobin: 13.1 g/dL (ref 12.0–15.0)
Lymphocytes Relative: 15.4 % (ref 12.0–46.0)
Lymphs Abs: 0.8 10*3/uL (ref 0.7–4.0)
MCHC: 34.6 g/dL (ref 30.0–36.0)
MCV: 94.7 fl (ref 78.0–100.0)
Monocytes Absolute: 0.4 10*3/uL (ref 0.1–1.0)
Monocytes Relative: 7.8 % (ref 3.0–12.0)
Neutro Abs: 4.1 10*3/uL (ref 1.4–7.7)
Neutrophils Relative %: 74.9 % (ref 43.0–77.0)
Platelets: 290 10*3/uL (ref 150.0–400.0)
RBC: 4 Mil/uL (ref 3.87–5.11)
RDW: 12.7 % (ref 11.5–15.5)
WBC: 5.4 10*3/uL (ref 4.0–10.5)

## 2020-11-02 NOTE — Patient Instructions (Addendum)
If you are age 77 or older, your body mass index should be between 23-30. Your Body mass index is 22.55 kg/m. If this is out of the aforementioned range listed, please consider follow up with your Primary Care Provider.  If you are age 68 or younger, your body mass index should be between 19-25. Your Body mass index is 22.55 kg/m. If this is out of the aformentioned range listed, please consider follow up with your Primary Care Provider.   Your provider has requested that you go to the basement level for lab work before leaving today. Press "B" on the elevator. The lab is located at the first door on the left as you exit the elevator.  Start Florastor probiotic twice daily for 2-4 weeks.  Start Lactaid 1-2 tablets with dairy products.   Call office if symptoms get worse.

## 2020-11-02 NOTE — Progress Notes (Signed)
11/02/2020 Elizabeth [name] [id number] 01/18/1941   CHIEF COMPLAINT: Diarrhea and dysphagia  HISTORY OF PRESENT ILLNESS: Elizabeth [name] is a 79 year old female with a past medical history of arthritis, breast cancer s/p lumpectomy and radiation, sleep apnea uses dental device, chronic back pain, dysphagia and IBS. She presents to our office today as referred by Dr. Birdie Riddle for further evaluation regarding dysphagia and explosive bowel movements. She reports having intermittent dysphagia for the past 2 years which has progressively worsened. She describes feeling a tightening to her esophagus and food passes down slower and sometimes gets stuck to the upper esophageal area which occurs once every few months. She drinks water and the stuck food passes. She denies having any classic heartburn symptoms. No upper or lower abdominal pain. Her father had his esophagus dilated in the past. She also complains of having explosive watery diarrhea after lunch which initially started 2 years ago and has progressively worsened over the past few months. No specific food triggers. She describes passing a 1 nonbloody watery explosive bowel movement after lunch x 1. The next 3 to 4 days she passes small bits of stool then her bowel pattern normalizes until she has another episode of lunchtime diarrhea. Over the past 2 weeks, she has had 3 or 4 episodes of explosive diarrhea. She was on an antibiotic for UTI in June and most recently in October 2021. No history of C. Difficile. She eats cereal with milk 3 days weekly. She eats cheese several days weekly. No history of lactose intolerance. No fever, sweats or chills. No weight loss. She was diagnosed with breast cancer in 2018 which required a lumpectomy and radiation. She underwent a colonoscopy by Dr. Henrene Pastor 01/05/2014 which showed moderate diverticulosis to the left colon otherwise was normal. Recall colonoscopy in 10 years was recommended. No other complaints  at this time.   Past Medical History:  Diagnosis Date  . Arthritis   . Cancer ([place])    pre cancer   . Chicken pox   . History of radiation therapy 06/26/17- 07/24/17   Left Breast 50.05 Gy total  . Personal history of radiation therapy   . Shingles 07-2002  . Sleep apnea    mild uses oral devise  . Urinary tract infection    Past Surgical History:  Procedure Laterality Date  . BREAST BIOPSY Bilateral 03/07/2017   benign  . BREAST BIOPSY Right 03/26/2017   benign  . BREAST BIOPSY Left 03/26/2017   malignant  . BREAST LUMPECTOMY Left 05/03/2017  . BREAST LUMPECTOMY WITH RADIOACTIVE SEED LOCALIZATION Left 05/03/2017   Procedure: LEFT BREAST LUMPECTOMY WITH RADIOACTIVE SEED LOCALIZATION;  Surgeon: Jovita Kussmaul, MD;  Location: [facility];  Service: General;  Laterality: Left;  . COLONOSCOPY    . HYSTEROSCOPY     Dr Ubaldo Glassing  . SPINAL FUSION  12/01/2016   T10 to Pelvis  . WISDOM TOOTH EXTRACTION     age 20's   Social History:  She is married. Retired. She has one daughter and one son. She smoked 1ppd x 10 to 12 years quit 1972. She drinks 1 or 2 glasses of wine 4 days weekly. No drug use.   Family History: Father with history of arthritis.  Mother with history of diabetes.  Paternal grandfather with heart disease.  Maternal aunt with cancer. Maternal uncle esophageal cancer. Maternal aunt with  liver cancer. Brother with prostate cancer.   Allergies  Allergen Reactions  . No Known Allergies  Outpatient Encounter Medications as of 11/02/2020  Medication Sig  . Biotin 2.5 MG CAPS Take 2.5 mg by mouth daily.   . Carboxymethylcellul-Glycerin (LUBRICATING EYE DROPS OP) Apply 1 drop to eye daily as needed (dry eyes).  . Cholecalciferol (VITAMIN D-1000 MAX ST) 25 MCG (1000 UT) tablet Take by mouth.  . gabapentin (NEURONTIN) 300 MG capsule Take 1 capsule (300 mg total) by mouth 4 (four) times daily.  . mometasone (ELOCON) 0.1 % cream Apply 1 application topically daily.  . montelukast  (SINGULAIR) 10 MG tablet TAKE 1 TABLET BY MOUTH EVERYDAY AT BEDTIME  . Multiple Vitamins-Minerals (CENTRUM SILVER 50+WOMEN PO) Take by mouth.  . Nutritional Supplements (JUICE PLUS FIBRE PO) Take by mouth. Juice plus fruit 4 tablets daily Juice plus veggie 4 tablets daily  . Probiotic Product (ADVANCED PROBIOTIC 10) CAPS Take 1 capsule by mouth daily.   No facility-administered encounter medications on file as of 11/02/2020.    REVIEW OF SYSTEMS:  Gen: Denies fever, sweats or chills. No weight loss.  CV: Denies chest pain, palpitations or edema. Resp: Denies cough, shortness of breath of hemoptysis.  GI: See HPI. GU : UTI/blood in urine last occurred 08/2020. MS: + back pain.  Derm: Denies rash, itchiness, skin lesions or unhealing ulcers. Psych: Denies depression, anxiety or loss.  Heme: Denies bruising, bleeding. Neuro:  Denies headaches, dizziness or paresthesias. Endo:  Denies any problems with DM, thyroid or adrenal function.  PHYSICAL EXAM: Wt 144 lb (65.3 kg)   BMI 22.55 kg/m  General: Well developed 79 year old female in no acute distress. Head: Normocephalic and atraumatic. Eyes:  Sclerae non-icteric, conjunctive pink. Contact lenses and use. Ears: Normal auditory acuity. Mouth: Dentition intact. No ulcers or lesions.  Neck: Supple, no lymphadenopathy or thyromegaly.  Lungs: Clear bilaterally to auscultation without wheezes, crackles or rhonchi. Heart: Regular rate and rhythm. No murmur, rub or gallop appreciated.  Abdomen: Soft, nontender, non distended. No masses. No hepatosplenomegaly. Normoactive bowel sounds x 4 quadrants.  Rectal: Deferred. Musculoskeletal: Symmetrical with no gross deformities. Skin: Warm and dry. No rash or lesions on visible extremities. Extremities: No edema. Neurological: Alert oriented x 4, no focal deficits.  Psychological:  Alert and cooperative. Normal mood and affect.  ASSESSMENT AND PLAN:  68. 78 year old female with  dysphagia -EGD benefits and risks discussed including risk with sedation, risk of bleeding, perforation and infection  -Patient advised to cut food in small pieces, chew food thoroughly. -Patient to call our office if her symptoms worsen  2. Episodic nonbloody diarrhea which occurs 10 to 15 minutes after eating lunch the past 2 years but has progressively worsened with more frequent episodes over the past 2 weeks. No abdominal pain.  -GI pathogen panel -CBC, CMP and CRP -Florastor probiotic 1 capsule p.o. twice daily -Lactaid 1-2 tabs with each dairy product -Consider a diagnostic colonoscopy if diarrhea worsens  3. Colon cancer screening -Next colonoscopy due 12/2023, consider an earlier colonoscopy due to the patient's  history of breast cancer, await further recommendations from  Dr. Henrene Pastor.        CC:  Midge Minium, MD

## 2020-11-03 ENCOUNTER — Other Ambulatory Visit: Payer: Medicare HMO

## 2020-11-03 DIAGNOSIS — R1310 Dysphagia, unspecified: Secondary | ICD-10-CM | POA: Diagnosis not present

## 2020-11-03 DIAGNOSIS — R197 Diarrhea, unspecified: Secondary | ICD-10-CM

## 2020-11-03 NOTE — Progress Notes (Signed)
Assessment and plan reviewed 

## 2020-11-04 DIAGNOSIS — D0512 Intraductal carcinoma in situ of left breast: Secondary | ICD-10-CM | POA: Diagnosis not present

## 2020-11-08 LAB — GI PROFILE, STOOL, PCR

## 2020-11-30 ENCOUNTER — Other Ambulatory Visit: Payer: Self-pay | Admitting: Family Medicine

## 2020-11-30 ENCOUNTER — Other Ambulatory Visit: Payer: Self-pay

## 2020-11-30 ENCOUNTER — Ambulatory Visit (AMBULATORY_SURGERY_CENTER): Payer: Medicare HMO | Admitting: Internal Medicine

## 2020-11-30 ENCOUNTER — Encounter: Payer: Self-pay | Admitting: Internal Medicine

## 2020-11-30 VITALS — BP 126/64 | HR 62 | Temp 97.1°F | Resp 11 | Ht 67.0 in | Wt 144.0 lb

## 2020-11-30 DIAGNOSIS — K449 Diaphragmatic hernia without obstruction or gangrene: Secondary | ICD-10-CM | POA: Diagnosis not present

## 2020-11-30 DIAGNOSIS — R1310 Dysphagia, unspecified: Secondary | ICD-10-CM | POA: Diagnosis not present

## 2020-11-30 HISTORY — PX: UPPER GASTROINTESTINAL ENDOSCOPY: SHX188

## 2020-11-30 MED ORDER — SODIUM CHLORIDE 0.9 % IV SOLN: 500.0000 mL | Freq: Once | INTRAVENOUS | Status: DC

## 2020-11-30 MED ORDER — OMEPRAZOLE 20 MG PO CPDR: 20.0000 mg | DELAYED_RELEASE_CAPSULE | Freq: Every day | ORAL | 11 refills | Status: DC

## 2020-11-30 NOTE — Progress Notes (Signed)
Called to room to assist during endoscopic procedure.  Patient ID and intended procedure confirmed with present staff. Received instructions for my participation in the procedure from the performing physician.  

## 2020-11-30 NOTE — Op Note (Signed)
[facility] Patient Name: Elizabeth [name] Procedure Date: 11/30/2020 8:59 AM MRN: [id number] Endoscopist: Docia Chuck. Henrene Pastor , MD Age: 80 Referring MD:  Date of Birth: [birth year] Gender: Female Account #: [id number] Procedure:                Upper GI endoscopy with [facility] dilation of the                            esophagus. 38 French Indications:              Dysphagia Medicines:                Monitored Anesthesia Care Procedure:                Pre-Anesthesia Assessment:                           - Prior to the procedure, a History and Physical                            was performed, and patient medications and                            allergies were reviewed. The patient's tolerance of                            previous anesthesia was also reviewed. The risks                            and benefits of the procedure and the sedation                            options and risks were discussed with the patient.                            All questions were answered, and informed consent                            was obtained. Prior Anticoagulants: The patient has                            taken no previous anticoagulant or antiplatelet                            agents. ASA Grade Assessment: II - A patient with                            mild systemic disease. After reviewing the risks                            and benefits, the patient was deemed in                            satisfactory condition to undergo the procedure.  After obtaining informed consent, the endoscope was                            passed under direct vision. Throughout the                            procedure, the patient's blood pressure, pulse, and                            oxygen saturations were monitored continuously. The                            Endoscope was introduced through the mouth, and                            advanced to the second part of duodenum. The  upper                            GI endoscopy was accomplished without difficulty.                            The patient tolerated the procedure well. Scope In: Scope Out: Findings:                 One benign-appearing, intrinsic moderate stenosis                            was found 36 cm from the incisors. This stenosis                            measured 1.4 cm (inner diameter). There was                            associated esophagitis as manifested by friability                            and edema. After completing the entire endoscopic                            survey, the scope was withdrawn. Dilation was                            performed with a Maloney dilator with no resistance                            at 61 Fr.                           The exam of the esophagus was otherwise normal.                           The stomach was normal. A small hiatal hernia was  present.                           The examined duodenum was normal except for a                            wid[place] diverticulum in the second portion.                           The cardia and gastric fundus were normal on                            retroflexion. Complications:            No immediate complications. Estimated Blood Loss:     Estimated blood loss: none. Impression:               1. Distal esophageal stricture with inflammation                            status post dilation                           2. Small hiatal hernia and incidental duodenal                            diverticulum                           3. Otherwise normal EGD. Recommendation:           1. Patient has a contact number available for                            emergencies. The signs and symptoms of potential                            delayed complications were discussed with the                            patient. Return to normal activities tomorrow.                            Written discharge  instructions were provided to the                            patient.                           2. Post dilation diet.                           3. Prescribe omeprazole 20 mg daily; #30; 11                            refills. This is being prescribed for the  inflammation of the esophagus and to prevent                            recurrent narrowing of the esophagus.                           4. Recommend daily fiber supplementation with                            Citrucel. 1 to 2 tablespoons in 12 ounces of water                            or juice. This to help improve the consistency of                            your bowel habits.                           5. Routine office follow-up with Dr. Henrene Pastor in about                            6 weeks. Docia Chuck. Henrene Pastor, MD 11/30/2020 9:31:32 AM This report has been signed electronically.

## 2020-11-30 NOTE — Progress Notes (Signed)
Pt's states no medical or surgical changes since previsit or office visit.  Vitals SP 

## 2020-11-30 NOTE — Patient Instructions (Signed)
HANDOUTS PROVIDED ON: POST DILATION DIET, HIATAL HERNIA, & STRICTURE  You may resume your previous medication schedule.  It is recommended you begin taking a daily fiber supplement like Citrucel to help improve consistency of your bowel habits.  Your diet today should consist of only clear liquids until 10:30 am and then soft foods for the rest of today.  You may resume your regular diet tomorrow.    A prescription has been sent to your pharmacy.  Take this medication 30-60 minutes before your first meal each day.  Thank you for allowing [place] to care for you today!!!   YOU HAD AN ENDOSCOPIC PROCEDURE TODAY AT [facility]:   Refer to the procedure report that was given to you for any specific questions about what was found during the examination.  If the procedure report does not answer your questions, please call your gastroenterologist to clarify.  If you requested that your care partner not be given the details of your procedure findings, then the procedure report has been included in a sealed envelope for you to review at your convenience later.  YOU SHOULD EXPECT: Some feelings of bloating in the abdomen. Passage of more gas than usual.  Walking can help get rid of the air that was put into your GI tract during the procedure and reduce the bloating.  Please Note:  You might notice some irritation and congestion in your nose or some drainage.  This is from the oxygen used during your procedure.  There is no need for concern and it should clear up in a day or so.  SYMPTOMS TO REPORT IMMEDIATELY:   Following upper endoscopy (EGD)  Vomiting of blood or coffee ground material  New chest pain or pain under the shoulder blades  Painful or persistently difficult swallowing  New shortness of breath  Fever of 100F or higher  Black, tarry-looking stools  For urgent or emergent issues, a gastroenterologist can be reached at any hour by calling [phone]. Do not use MyChart  messaging for urgent concerns.    DIET:  We do recommend the post dilation diet today and tomorrow start with a small meal at first, but then you may proceed to your regular diet.  Drink plenty of fluids but you should avoid alcoholic beverages for 24 hours.  ACTIVITY:  You should plan to take it easy for the rest of today and you should NOT DRIVE or use heavy machinery until tomorrow (because of the sedation medicines used during the test).    FOLLOW UP: Our staff will call the number listed on your records Thursday morning between 7:15 am and 8:15 am to check on you and address any questions or concerns that you may have regarding the information given to you following your procedure. If we do not reach you, we will leave a message.  We will attempt to reach you two times.  During this call, we will ask if you have developed any symptoms of COVID 19. If you develop any symptoms (ie: fever, flu-like symptoms, shortness of breath, cough etc.) before then, please call [phone].  If you test positive for Covid 19 in the 2 weeks post procedure, please call and report this information to [place].    If any biopsies were taken you will be contacted by phone or by letter within the next 1-3 weeks.  Please call us at [phone] if you have not heard about the biopsies in 3 weeks.    SIGNATURES/CONFIDENTIALITY: You  and/or your care partner have signed paperwork which will be entered into your electronic medical record.  These signatures attest to the fact that that the information above on your After Visit Summary has been reviewed and is understood.  Full responsibility of the confidentiality of this discharge information lies with you and/or your care-partner.

## 2020-11-30 NOTE — Progress Notes (Signed)
A and O x3. Report to RN. Tolerated MAC anesthesia well.Teeth unchanged after procedure.

## 2020-12-02 ENCOUNTER — Telehealth: Payer: Self-pay | Admitting: *Deleted

## 2020-12-02 NOTE — Telephone Encounter (Signed)
  Follow up Call-  Call back number 11/30/2020  Post procedure Call Back phone  # [phone]  Permission to leave phone message Yes  Some recent data might be hidden     Patient questions:  Do you have a fever, pain , or abdominal swelling? No. Pain Score  0 *  Have you tolerated food without any problems? Yes.    Have you been able to return to your normal activities? Yes.    Do you have any questions about your discharge instructions: Diet   No. Medications  No. Follow up visit  No.  Do you have questions or concerns about your Care? No.  Actions: * If pain score is 4 or above: No action needed, pain <4.  1. Have you developed a fever since your procedure? no  2.   Have you had an respiratory symptoms (SOB or cough) since your procedure? no  3.   Have you tested positive for COVID 19 since your procedure no  4.   Have you had any family members/close contacts diagnosed with the COVID 19 since your procedure?  no   If yes to any of these questions please route to Joylene John, RN and Joella Prince, RN

## 2020-12-30 ENCOUNTER — Other Ambulatory Visit: Payer: Self-pay | Admitting: General Surgery

## 2020-12-30 ENCOUNTER — Other Ambulatory Visit: Payer: Medicare HMO

## 2020-12-30 ENCOUNTER — Other Ambulatory Visit: Payer: Self-pay | Admitting: Adult Health

## 2020-12-30 DIAGNOSIS — N63 Unspecified lump in unspecified breast: Secondary | ICD-10-CM

## 2021-01-12 ENCOUNTER — Encounter: Payer: Self-pay | Admitting: Internal Medicine

## 2021-01-12 ENCOUNTER — Ambulatory Visit: Payer: Medicare HMO | Admitting: Internal Medicine

## 2021-01-12 VITALS — BP 144/78 | HR 84 | Ht 66.0 in | Wt 144.4 lb

## 2021-01-12 DIAGNOSIS — R197 Diarrhea, unspecified: Secondary | ICD-10-CM

## 2021-01-12 DIAGNOSIS — R1310 Dysphagia, unspecified: Secondary | ICD-10-CM | POA: Diagnosis not present

## 2021-01-12 DIAGNOSIS — K219 Gastro-esophageal reflux disease without esophagitis: Secondary | ICD-10-CM

## 2021-01-12 MED ORDER — OMEPRAZOLE 20 MG PO CPDR: 20.0000 mg | DELAYED_RELEASE_CAPSULE | Freq: Every day | ORAL | 3 refills | Status: DC

## 2021-01-12 NOTE — Patient Instructions (Signed)
We have sent the following medications to your pharmacy for you to pick up at your convenience:  Omeprazole.  Please follow up in one year  

## 2021-01-12 NOTE — Progress Notes (Signed)
HISTORY OF PRESENT ILLNESS:  Elizabeth [name] is a 80 y.o. female with past medical history as listed below who presents today for follow-up post upper endoscopy.  She was seen by the GI nurse practitioner November 02, 2020 with dysphagia and episodic diarrhea.  She subsequently underwent upper endoscopy November 30, 2020.  She was found to have a distal esophageal stricture with associated inflammation and a small hiatal hernia.  Esophagus was dilated with a 54 [place] Mal[place] dilator.  She was prescribed omeprazole 20 mg daily.  For her irregular bowel habits she was told to initiate Citrucel 1 or 2 tablespoons daily.  Follow-up at this time arranged.  Patient reports being compliant with PPI therapy.  No indigestion or heartburn.  No further problems with dysphagia.  Next, she reports marked improvement in her bowel habits.  In addition to initiating Citrucel, she took probiotic Florastor for 6 weeks and has been using Lactaid with consumption of dairy products.  She does have several questions regarding her conditions.  No new complaints.  No relevant interval data to report.  Colonoscopy performed February 2015 revealed diverticulosis but was otherwise normal.  REVIEW OF SYSTEMS:  All non-GI ROS negative unless otherwise stated in the HPI except for arthritis, back pain, muscle cramps  Past Medical History:  Diagnosis Date  . Arthritis   . 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 Chicken pox   . History of radiation therapy 06/26/17- 07/24/17   Left Breast 50.05 Gy total  . Personal history of radiation therapy   . Shingles 07-2002  . Sleep apnea    mild uses oral devise  . Urinary tract infection     Past Surgical History:  Procedure Laterality Date  . BREAST BIOPSY Bilateral 03/07/2017   benign  . BREAST BIOPSY Right 03/26/2017   benign  . BREAST BIOPSY Left 03/26/2017   malignant  . BREAST LUMPECTOMY Left 05/03/2017  . BREAST LUMPECTOMY WITH RADIOACTIVE SEED LOCALIZATION Left 05/03/2017    Procedure: LEFT BREAST LUMPECTOMY WITH RADIOACTIVE SEED LOCALIZATION;  Surgeon: Jovita Kussmaul, MD;  Location: [facility];  Service: General;  Laterality: Left;  . COLONOSCOPY    . HYSTEROSCOPY     Dr Ubaldo Glassing  . SPINAL FUSION  12/01/2016   T10 to Pelvis  . UPPER GASTROINTESTINAL ENDOSCOPY  11/30/2020  . WISDOM TOOTH EXTRACTION     age 44's    Social History Dimond Crotty Aguas  reports that she quit smoking about 39 years ago. Her smoking use included cigarettes. She has never used smokeless tobacco. She reports current alcohol use of about 2.0 standard drinks of alcohol per week. She reports that she does not use drugs.  family history includes Arthritis in her father; Cancer in her maternal aunt; Diabetes in her mother; Heart disease in her paternal grandfather; Prostate cancer in her brother.  No Known Allergies     PHYSICAL EXAMINATION: Vital signs: BP (!) 144/78 (BP Location: Left Arm, Patient Position: Sitting, Cuff Size: Normal)   Pulse 84   Ht 5\' 6"  (1.676 m) Comment: height measured without shoes  Wt 144 lb 6 oz (65.5 kg)   BMI 23.30 kg/m   Constitutional: generally well-appearing, no acute distress Psychiatric: alert and oriented x3, cooperative Eyes: Anicteric Mouth: Mask Abdomen: Not reexamined Skin: no lesions on visible extremities Neuro: No focal deficits.   ASSESSMENT:  1.  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  Currently asymptomatic post dilation on  PPI 2.  Irregular bowel habits.  Improved after combination of probiotic, fiber, and on-demand Lactaid 3.  Colonoscopy February 2015 with diverticulosis   PLAN:  1.  Reflux precautions 2.  Continue omeprazole 20 mg daily.  Refilled.  Medication risks reviewed 3.  Discussion today on GERD and the complication of peptic stricture. 4.  Continue regular fiber supplementation. 5.  Routine office follow-up 1 year.  Contact the office in the interim for any  questions or problems A total time of 30 minutes was spent preparing to see the patient, reviewing tests, obtaining history, performing medically propria exam, counseling patient regarding her above listed issues, ordering medications and follow-up.  Finally, documenting clinical information in the health record

## 2021-01-24 DIAGNOSIS — M25552 Pain in left hip: Secondary | ICD-10-CM | POA: Insufficient documentation

## 2021-01-27 DIAGNOSIS — M1612 Unilateral primary osteoarthritis, left hip: Secondary | ICD-10-CM | POA: Diagnosis not present

## 2021-01-27 DIAGNOSIS — M160 Bilateral primary osteoarthritis of hip: Secondary | ICD-10-CM | POA: Diagnosis not present

## 2021-01-27 DIAGNOSIS — M1611 Unilateral primary osteoarthritis, right hip: Secondary | ICD-10-CM | POA: Diagnosis not present

## 2021-01-31 ENCOUNTER — Telehealth: Payer: Self-pay | Admitting: Family Medicine

## 2021-01-31 NOTE — Telephone Encounter (Signed)
..  Type of form received:Physical form for [facility] Additional comments:   Received by:  Tye Savoy should be Faxed to:[phone]  Form should be mailed to:    Is patient requesting call for pickup:   Form placed:  In Dr. Virgil Benedict bin Attach charge sheet.  Provider will determine charge.  Individual made aware of 3-5 business day turn around (Y/N)?

## 2021-01-31 NOTE — Telephone Encounter (Signed)
Form placed in bin to be filled.

## 2021-02-07 DIAGNOSIS — Z0279 Encounter for issue of other medical certificate: Secondary | ICD-10-CM

## 2021-02-07 NOTE — Telephone Encounter (Signed)
Form completed and placed in basket  

## 2021-02-08 NOTE — Telephone Encounter (Signed)
Picked up from the back, faxed to the # on the form and  sent to scan

## 2021-02-16 DIAGNOSIS — M25552 Pain in left hip: Secondary | ICD-10-CM | POA: Diagnosis not present

## 2021-02-16 DIAGNOSIS — M25551 Pain in right hip: Secondary | ICD-10-CM | POA: Diagnosis not present

## 2021-03-15 ENCOUNTER — Other Ambulatory Visit: Payer: Self-pay

## 2021-03-15 ENCOUNTER — Ambulatory Visit
Admission: RE | Admit: 2021-03-15 | Discharge: 2021-03-15 | Disposition: A | Discharge status: Home / Self Care | Payer: Medicare HMO | Source: Ambulatory Visit | Attending: General Surgery | Admitting: General Surgery

## 2021-03-15 ENCOUNTER — Other Ambulatory Visit: Payer: Self-pay | Admitting: General Surgery

## 2021-03-15 DIAGNOSIS — N6001 Solitary cyst of right breast: Secondary | ICD-10-CM | POA: Diagnosis not present

## 2021-03-15 DIAGNOSIS — N63 Unspecified lump in unspecified breast: Secondary | ICD-10-CM

## 2021-03-15 DIAGNOSIS — Z853 Personal history of malignant neoplasm of breast: Secondary | ICD-10-CM | POA: Diagnosis not present

## 2021-04-05 ENCOUNTER — Encounter: Payer: Self-pay | Admitting: Family Medicine

## 2021-04-06 ENCOUNTER — Ambulatory Visit: Payer: Self-pay | Admitting: Family Medicine

## 2021-04-06 ENCOUNTER — Ambulatory Visit (INDEPENDENT_AMBULATORY_CARE_PROVIDER_SITE_OTHER): Payer: Medicare HMO | Admitting: Family Medicine

## 2021-04-06 ENCOUNTER — Encounter: Payer: Self-pay | Admitting: Family Medicine

## 2021-04-06 ENCOUNTER — Other Ambulatory Visit: Payer: Self-pay

## 2021-04-06 VITALS — BP 120/70 | HR 74 | Temp 97.7°F | Resp 17 | Ht 66.0 in | Wt 142.0 lb

## 2021-04-06 DIAGNOSIS — R350 Frequency of micturition: Secondary | ICD-10-CM

## 2021-04-06 LAB — POCT URINALYSIS DIPSTICK
Bilirubin, UA: NEGATIVE
Blood, UA: POSITIVE
Glucose, UA: NEGATIVE
Ketones, UA: NEGATIVE
Nitrite, UA: NEGATIVE
Protein, UA: POSITIVE — AB
Spec Grav, UA: 1.01 (ref 1.010–1.025)
Urobilinogen, UA: 0.2 E.U./dL
pH, UA: 6.5 (ref 5.0–8.0)

## 2021-04-06 MED ORDER — CEPHALEXIN 500 MG PO CAPS: 500.0000 mg | ORAL_CAPSULE | Freq: Two times a day (BID) | ORAL | 0 refills | Status: AC

## 2021-04-06 NOTE — Progress Notes (Signed)
   Subjective:    Patient ID: Elizabeth [name], female    DOB: 1940/12/24, 80 y.o.   MRN: [id number]  HPI Dysuria- pt woke at 3am needing to urinate but continued to have pressure despite urinating.  Pt reports there is a wave of pain that occurs w/ urination.  Denies frequency.  + urgency.   Denies fever.  Some chills.  No nausea.   Review of Systems For ROS see HPI   This visit occurred during the SARS-CoV-2 public health emergency.  Safety protocols were in place, including screening questions prior to the visit, additional usage of staff PPE, and extensive cleaning of exam room while observing appropriate contact time as indicated for disinfecting solutions.       Objective:   Physical Exam Vitals reviewed.  Constitutional:      General: She is not in acute distress.    Appearance: Normal appearance. She is well-developed.  Abdominal:     General: There is no distension.     Palpations: Abdomen is soft.     Tenderness: There is no abdominal tenderness (no suprapubic or CVA tenderness). There is no right CVA tenderness, left CVA tenderness, guarding or rebound.  Skin:    General: Skin is warm and dry.  Neurological:     General: No focal deficit present.     Mental Status: She is alert and oriented to person, place, and time.  Psychiatric:        Mood and Affect: Mood normal.        Behavior: Behavior normal.        Thought Content: Thought content normal.           Assessment & Plan:  Dysuria- new.  Pt's sxs and UA consistent w/ UTI.  Start Keflex while awaiting culture results.  Pt expressed understanding and is in agreement w/ plan.

## 2021-04-06 NOTE — Patient Instructions (Signed)
Follow up as needed or as scheduled START the Cephalexin twice daily for the UTI Drink LOTS of fluids Call with any questions or concerns Hang in there!!!

## 2021-04-09 LAB — URINE CULTURE
MICRO NUMBER:: 11908943
SPECIMEN QUALITY:: ADEQUATE

## 2021-04-22 ENCOUNTER — Telehealth: Payer: Self-pay | Admitting: Family Medicine

## 2021-04-22 NOTE — Telephone Encounter (Signed)
Patient feels as if she has a cold. She has some congestion but just feels as if she has a cold. Patient would like recommendations on what to take.

## 2021-04-22 NOTE — Telephone Encounter (Signed)
Supportive care - tylenol, hydrate, rest. Isolate for 5 days, mask and distance for 5 more days after that. Can see if there's a virtual available at another office to check on antivirals  Thanks  Rich

## 2021-04-22 NOTE — Telephone Encounter (Signed)
Patient tested positive for covid and would like to have someone call her.

## 2021-04-22 NOTE — Telephone Encounter (Signed)
Left a vm message with instructions per NP Orland Mustard.

## 2021-04-25 ENCOUNTER — Encounter: Payer: Self-pay | Admitting: Family Medicine

## 2021-04-25 NOTE — Telephone Encounter (Signed)
Please check on her and see how she is feeling

## 2021-04-25 NOTE — Telephone Encounter (Signed)
Called patient, she states she feels much better and "almost feels back to normal".

## 2021-05-02 ENCOUNTER — Telehealth: Payer: Self-pay | Admitting: Family Medicine

## 2021-05-02 NOTE — Telephone Encounter (Signed)
..  Type of form received: Travel documentation  Additional comments:   Received TM:MITVI  Form should be Faxed to:  Form should be mailed to:    Is patient requesting call for pickup:Yes   Form placed:  In Dr. Virgil Benedict bin  Attach charge sheet.  Provider will determine charge.  Individual made aware of 3-5 business day turn around (Y/N)?

## 2021-05-03 NOTE — Telephone Encounter (Signed)
Forms placed in bin to be filled 

## 2021-05-09 ENCOUNTER — Other Ambulatory Visit: Payer: Self-pay | Admitting: Family Medicine

## 2021-05-11 DIAGNOSIS — M25551 Pain in right hip: Secondary | ICD-10-CM | POA: Diagnosis not present

## 2021-05-11 DIAGNOSIS — M25552 Pain in left hip: Secondary | ICD-10-CM | POA: Diagnosis not present

## 2021-05-14 ENCOUNTER — Ambulatory Visit (HOSPITAL_COMMUNITY)
Admission: EM | Admit: 2021-05-14 | Discharge: 2021-05-14 | Disposition: A | Discharge status: Home / Self Care | Payer: Medicare HMO | Attending: Emergency Medicine | Admitting: Emergency Medicine

## 2021-05-14 ENCOUNTER — Encounter (HOSPITAL_COMMUNITY): Payer: Self-pay | Admitting: Emergency Medicine

## 2021-05-14 ENCOUNTER — Other Ambulatory Visit: Payer: Self-pay

## 2021-05-14 DIAGNOSIS — N390 Urinary tract infection, site not specified: Secondary | ICD-10-CM | POA: Diagnosis not present

## 2021-05-14 LAB — POCT URINALYSIS DIPSTICK, ED / UC
Bilirubin Urine: NEGATIVE
Glucose, UA: NEGATIVE mg/dL
Ketones, ur: NEGATIVE mg/dL
Nitrite: NEGATIVE
Protein, ur: NEGATIVE mg/dL
Specific Gravity, Urine: 1.01 (ref 1.005–1.030)
Urobilinogen, UA: 0.2 mg/dL (ref 0.0–1.0)
pH: 6 (ref 5.0–8.0)

## 2021-05-14 MED ORDER — CEPHALEXIN 500 MG PO CAPS: 500.0000 mg | ORAL_CAPSULE | Freq: Two times a day (BID) | ORAL | 0 refills | Status: AC

## 2021-05-14 MED ORDER — CEPHALEXIN 500 MG PO CAPS: 500.0000 mg | ORAL_CAPSULE | Freq: Four times a day (QID) | ORAL | 0 refills | Status: DC

## 2021-05-14 NOTE — ED Provider Notes (Signed)
[facility]   CC: UTI  SUBJECTIVE:  Elizabeth [name] is a 80 y.o. female who complains of urinary frequency, urgency and dysuria that has been ongoing since Wednesday.  Reports symptoms initially improved after taking AZO and drinking cranberry but have gotten worse since yesterday.  Patient denies a precipitating event, recent sexual encounter, excessive caffeine intake. Localizes the pain to the lower abdomen.  Pain is intermittent and describes it as sharp/burning. Symptoms are made worse with urination. Admits to similar symptoms in the past.  Denies fever, chills, nausea, vomiting, abdominal pain, flank pain, abnormal vaginal discharge or bleeding, hematuria.    LMP: No LMP recorded. Patient is postmenopausal.  ROS: As in HPI.  All other pertinent ROS negative.     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    Chicken pox    History of radiation therapy 06/26/17- 07/24/17   Left Breast 50.05 Gy total   Personal history of radiation therapy    Shingles 07-2002   Sleep apnea    mild uses oral devise   Urinary tract infection    Past Surgical History:  Procedure Laterality Date   BREAST BIOPSY Bilateral 03/07/2017   benign   BREAST BIOPSY Right 03/26/2017   benign   BREAST BIOPSY Left 03/26/2017   malignant   BREAST LUMPECTOMY Left 05/03/2017   BREAST LUMPECTOMY WITH RADIOACTIVE SEED LOCALIZATION Left 05/03/2017   Procedure: LEFT BREAST LUMPECTOMY WITH RADIOACTIVE SEED LOCALIZATION;  Surgeon: Jovita Kussmaul, MD;  Location: [facility];  Service: General;  Laterality: Left;   COLONOSCOPY     HYSTEROSCOPY     Dr Ubaldo Glassing   SPINAL FUSION  12/01/2016   T10 to Pelvis   UPPER GASTROINTESTINAL ENDOSCOPY  11/30/2020   WISDOM TOOTH EXTRACTION     age 87's   No Known Allergies No current facility-administered medications on file prior to encounter.   Current Outpatient Medications on File Prior to Encounter  Medication Sig Dispense  Refill   Biotin 5000 MCG TABS Take 1 tablet by mouth daily.     Cholecalciferol (VITAMIN D-1000 MAX ST) 25 MCG (1000 UT) tablet Take by mouth.     gabapentin (NEURONTIN) 300 MG capsule TAKE 1 CAPSULE BY MOUTH 4 TIMES A DAY 360 capsule 1   montelukast (SINGULAIR) 10 MG tablet TAKE 1 TABLET BY MOUTH EVERYDAY AT BEDTIME 90 tablet 1   Multiple Vitamins-Minerals (CENTRUM SILVER 50+WOMEN PO) Take by mouth.     omeprazole (PRILOSEC) 20 MG capsule Take 1 capsule (20 mg total) by mouth daily. 90 capsule 3   Probiotic Product (ADVANCED PROBIOTIC 10) CAPS Take 1 capsule by mouth daily. (Patient not taking: Reported on 01/12/2021)     Social History   Socioeconomic History   Marital status: Married    Spouse name: Not on file   Number of children: 2   Years of education: Not on file   Highest education level: Not on file  Occupational History   Occupation: retired  Tobacco Use   Smoking status: Former    Pack years: 0.00    Types: Cigarettes    Quit date: 02/18/1981    Years since quitting: 40.2   Smokeless tobacco: Never  Vaping Use   Vaping Use: Never used  Substance and Sexual Activity   Alcohol use: Yes    Alcohol/week: 2.0 standard drinks    Types: 2 Glasses of wine per week  Comment: 2 glasses 4 days/week   Drug use: No   Sexual activity: Yes  Other Topics Concern   Not on file  Social History Narrative   Not on file   Social Determinants of Health   Financial Resource Strain: Low Risk    Difficulty of Paying Living Expenses: Not hard at all  Food Insecurity: No Food Insecurity   Worried About Charity fundraiser in the Last Year: Never true   [place] in the Last Year: Never true  Transportation Needs: No Transportation Needs   Lack of Transportation (Medical): No   Lack of Transportation (Non-Medical): No  Physical Activity: Sufficiently Active   Days of Exercise per Week: 3 days   Minutes of Exercise per Session: 60 min  Stress: No Stress Concern Present    Feeling of Stress : Not at all  Social Connections: Socially Integrated   Frequency of Communication with Friends and Family: More than three times a week   Frequency of Social Gatherings with Friends and Family: More than three times a week   Attends Religious Services: More than 4 times per year   Active Member of [employer] or Organizations: Yes   Attends Music therapist: More than 4 times per year   Marital Status: Married  Human resources officer Violence: Not At Risk   Fear of Current or Ex-Partner: No   Emotionally Abused: No   Physically Abused: No   Sexually Abused: No   Family History  Problem Relation Age of Onset   Diabetes Mother    Arthritis Father    Cancer Maternal Aunt        liver   Heart disease Paternal Grandfather    Prostate cancer Brother    Colon cancer Neg Hx    Pancreatic cancer Neg Hx    Stomach cancer Neg Hx     OBJECTIVE:  Vitals:   05/14/21 1533  BP: (!) 172/85  Pulse: 84  Resp: 17  Temp: 98.3 F (36.8 C)  TempSrc: Oral  SpO2: 95%   General appearance: AOx3 in no acute distress HEENT: NCAT. Oropharynx clear.  Lungs: clear to auscultation bilaterally without adventitious breath sounds Heart: regular rate and rhythm. Radial pulses 2+ symmetrical bilaterally Abdomen: soft; non-distended; no tenderness; bowel sounds present; no guarding or rebound tenderness Back: no CVA tenderness Extremities: no edema; symmetrical with no gross deformities Skin: warm and dry Neurologic: Ambulates from chair to exam table without difficulty Psychological: alert and cooperative; normal mood and affect  Labs Reviewed  POCT URINALYSIS DIPSTICK, ED / UC - Abnormal; Notable for the following components:      Result Value   Hgb urine dipstick SMALL (*)    Leukocytes,Ua MODERATE (*)    All other components within normal limits  URINE CULTURE    ASSESSMENT & PLAN:  1. Lower urinary tract infectious disease     Meds ordered this encounter   Medications   DISCONTD: cephALEXin (KEFLEX) 500 MG capsule    Sig: Take 1 capsule (500 mg total) by mouth 4 (four) times daily.    Dispense:  20 capsule    Refill:  0    Order Specific Question:   Supervising Provider    Answer:   Chase Picket [[id number]]   cephALEXin (KEFLEX) 500 MG capsule    Sig: Take 1 capsule (500 mg total) by mouth 2 (two) times daily for 7 days.    Dispense:  14 capsule    Refill:  0  Order Specific Question:   Supervising Provider    Answer:   Chase Picket [[id number]]    Urine culture sent  We will call you with abnormal results that need further treatment Push fluids and get plenty of rest Take antibiotic as directed and to completion May take AZO or cranberry pills as needed for symptom relief.  Follow up with PCP if symptoms persists Return here or go to ER if you have any new or worsening symptoms such as fever, worsening abdominal pain, nausea/vomiting, flank pain  Outlined signs and symptoms indicating need for more acute intervention Patient verbalized understanding After Visit Summary given      Pearson Forster, NP 05/14/21 1605

## 2021-05-14 NOTE — ED Triage Notes (Signed)
Pt is present today with dysuria and diarrhea that started two days ago.

## 2021-05-14 NOTE — Discharge Instructions (Addendum)
Take the Keflex twice a day for the next 7 days.   Make sure you are drinking plenty of water.  You can also drink cranberry juice as needed for symptom management.   You can take AZO or cranberry pills for symptom management.    Return or go to the Emergency Department if symptoms worsen or do not improve in the next few days.

## 2021-05-17 LAB — URINE CULTURE: Culture: >=100000 — AB

## 2021-05-17 NOTE — Telephone Encounter (Signed)
Form completed and placed in basket  

## 2021-05-18 ENCOUNTER — Encounter: Payer: Self-pay | Admitting: *Deleted

## 2021-05-31 ENCOUNTER — Ambulatory Visit
Admission: RE | Admit: 2021-05-31 | Discharge: 2021-05-31 | Disposition: A | Discharge status: Home / Self Care | Payer: Medicare HMO | Source: Ambulatory Visit | Attending: Family Medicine | Admitting: Family Medicine

## 2021-05-31 ENCOUNTER — Other Ambulatory Visit: Payer: Medicare HMO

## 2021-05-31 ENCOUNTER — Other Ambulatory Visit: Payer: Self-pay

## 2021-05-31 DIAGNOSIS — M85832 Other specified disorders of bone density and structure, left forearm: Secondary | ICD-10-CM | POA: Diagnosis not present

## 2021-05-31 DIAGNOSIS — Z780 Asymptomatic menopausal state: Secondary | ICD-10-CM | POA: Diagnosis not present

## 2021-06-21 DIAGNOSIS — H2513 Age-related nuclear cataract, bilateral: Secondary | ICD-10-CM | POA: Diagnosis not present

## 2021-06-21 DIAGNOSIS — H5213 Myopia, bilateral: Secondary | ICD-10-CM | POA: Diagnosis not present

## 2021-06-30 ENCOUNTER — Other Ambulatory Visit: Payer: Self-pay

## 2021-06-30 ENCOUNTER — Ambulatory Visit (HOSPITAL_COMMUNITY)
Admission: EM | Admit: 2021-06-30 | Discharge: 2021-06-30 | Disposition: A | Discharge status: Home / Self Care | Payer: Medicare HMO | Attending: Emergency Medicine | Admitting: Emergency Medicine

## 2021-06-30 ENCOUNTER — Encounter (HOSPITAL_COMMUNITY): Payer: Self-pay | Admitting: Emergency Medicine

## 2021-06-30 DIAGNOSIS — N390 Urinary tract infection, site not specified: Secondary | ICD-10-CM | POA: Insufficient documentation

## 2021-06-30 DIAGNOSIS — M25552 Pain in left hip: Secondary | ICD-10-CM

## 2021-06-30 DIAGNOSIS — Z0100 Encounter for examination of eyes and vision without abnormal findings: Secondary | ICD-10-CM | POA: Diagnosis not present

## 2021-06-30 LAB — POCT URINALYSIS DIPSTICK, ED / UC
Glucose, UA: NEGATIVE mg/dL
Ketones, ur: NEGATIVE mg/dL
Nitrite: NEGATIVE
Protein, ur: >=300 mg/dL — AB
Specific Gravity, Urine: 1.02 (ref 1.005–1.030)
Urobilinogen, UA: 0.2 mg/dL (ref 0.0–1.0)
pH: 6.5 (ref 5.0–8.0)

## 2021-06-30 MED ORDER — CEPHALEXIN 500 MG PO CAPS: 500.0000 mg | ORAL_CAPSULE | Freq: Two times a day (BID) | ORAL | 0 refills | Status: AC

## 2021-06-30 MED ORDER — GABAPENTIN 300 MG PO CAPS: ORAL_CAPSULE | ORAL | 0 refills | Status: DC

## 2021-06-30 NOTE — ED Provider Notes (Signed)
[facility]   CC: UTI  SUBJECTIVE:  Elizabeth [name] is a 80 y.o. female who complains of urinary frequency, urgency and dysuria for the past 3 days.  Patient denies a precipitating event, recent sexual encounter, excessive caffeine intake. Localizes the pain to the lower abdomen.  Pain is intermittent and describes it as burning.  Symptoms are made worse with urination. Admits to similar symptoms in the past.  Denies fever, chills, nausea, vomiting, abdominal pain, flank pain, abnormal vaginal discharge or bleeding, hematuria.    LMP: No LMP recorded. Patient is postmenopausal.  ROS: As in HPI.  All other pertinent ROS negative.     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    Chicken pox    History of radiation therapy 06/26/17- 07/24/17   Left Breast 50.05 Gy total   Personal history of radiation therapy    Shingles 07-2002   Sleep apnea    mild uses oral devise   Urinary tract infection    Past Surgical History:  Procedure Laterality Date   BREAST BIOPSY Bilateral 03/07/2017   benign   BREAST BIOPSY Right 03/26/2017   benign   BREAST BIOPSY Left 03/26/2017   malignant   BREAST LUMPECTOMY Left 05/03/2017   BREAST LUMPECTOMY WITH RADIOACTIVE SEED LOCALIZATION Left 05/03/2017   Procedure: LEFT BREAST LUMPECTOMY WITH RADIOACTIVE SEED LOCALIZATION;  Surgeon: Jovita Kussmaul, MD;  Location: [place];  Service: General;  Laterality: Left;   COLONOSCOPY     HYSTEROSCOPY     Dr Ubaldo Glassing   SPINAL FUSION  12/01/2016   T10 to Pelvis   UPPER GASTROINTESTINAL ENDOSCOPY  11/30/2020   WISDOM TOOTH EXTRACTION     age 54's   No Known Allergies No current facility-administered medications on file prior to encounter.   Current Outpatient Medications on File Prior to Encounter  Medication Sig Dispense Refill   Biotin 5000 MCG TABS Take 1 tablet by mouth daily.     Cholecalciferol (VITAMIN D-1000 MAX ST) 25 MCG (1000 UT) tablet Take by  mouth.     gabapentin (NEURONTIN) 300 MG capsule TAKE 1 CAPSULE BY MOUTH 4 TIMES A DAY 360 capsule 1   montelukast (SINGULAIR) 10 MG tablet TAKE 1 TABLET BY MOUTH EVERYDAY AT BEDTIME 90 tablet 1   Multiple Vitamins-Minerals (CENTRUM SILVER 50+WOMEN PO) Take by mouth.     omeprazole (PRILOSEC) 20 MG capsule Take 1 capsule (20 mg total) by mouth daily. 90 capsule 3   Probiotic Product (ADVANCED PROBIOTIC 10) CAPS Take 1 capsule by mouth daily. (Patient not taking: Reported on 01/12/2021)     Social History   Socioeconomic History   Marital status: Married    Spouse name: Not on file   Number of children: 2   Years of education: Not on file   Highest education level: Not on file  Occupational History   Occupation: retired  Tobacco Use   Smoking status: Former    Types: Cigarettes    Quit date: 02/18/1981    Years since quitting: 40.3   Smokeless tobacco: Never  Vaping Use   Vaping Use: Never used  Substance and Sexual Activity   Alcohol use: Yes    Alcohol/week: 2.0 standard drinks    Types: 2 Glasses of wine per week    Comment: 2 glasses 4 days/week   Drug use: No   Sexual activity: Yes  Other Topics Concern   Not on  file  Social History Narrative   Not on file   Social Determinants of Health   Financial Resource Strain: Not on file  Food Insecurity: Not on file  Transportation Needs: Not on file  Physical Activity: Not on file  Stress: Not on file  Social Connections: Not on file  Intimate Partner Violence: Not on file   Family History  Problem Relation Age of Onset   Diabetes Mother    Arthritis Father    Cancer Maternal Aunt        liver   Heart disease Paternal Grandfather    Prostate cancer Brother    Colon cancer Neg Hx    Pancreatic cancer Neg Hx    Stomach cancer Neg Hx     OBJECTIVE:  Vitals:   06/30/21 1019  BP: (!) 154/85  Pulse: 77  Resp: 16  Temp: 98 F (36.7 C)  TempSrc: Oral  SpO2: 98%   General appearance: AOx3 in no acute  distress HEENT: NCAT. Oropharynx clear.  Lungs: clear to auscultation bilaterally without adventitious breath sounds Heart: regular rate and rhythm. Radial pulses 2+ symmetrical bilaterally Abdomen: soft; non-distended; no tenderness; bowel sounds present; no guarding or rebound tenderness Back: no CVA tenderness Extremities: no edema; symmetrical with no gross deformities Skin: warm and dry Neurologic: Ambulates from chair to exam table without difficulty Psychological: alert and cooperative; normal mood and affect  Labs Reviewed  POCT URINALYSIS DIPSTICK, ED / UC - Abnormal; Notable for the following components:      Result Value   Bilirubin Urine SMALL (*)    Hgb urine dipstick LARGE (*)    Protein, ur >=300 (*)    Leukocytes,Ua MODERATE (*)    All other components within normal limits  URINE CULTURE    ASSESSMENT & PLAN:  1. Acute lower urinary tract infection     Meds ordered this encounter  Medications   cephALEXin (KEFLEX) 500 MG capsule    Sig: Take 1 capsule (500 mg total) by mouth 2 (two) times daily for 7 days.    Dispense:  14 capsule    Refill:  0    Order Specific Question:   Supervising Provider    Answer:   Chase Picket [id number]    Urine culture sent  We will call you with abnormal results that need further treatment Push fluids and get plenty of rest Take antibiotic as directed and to completion Follow up with PCP if symptoms persists Return here or go to ER if you have any new or worsening symptoms such as fever, worsening abdominal pain, nausea/vomiting, flank pain  Outlined signs and symptoms indicating need for more acute intervention Patient verbalized understanding After Visit Summary given      Pearson Forster, NP 06/30/21 1040

## 2021-06-30 NOTE — Discharge Instructions (Addendum)
Take the Keflex twice a day for the next 7 days.    You can take Tylenol and/or Ibuprofen as needed for pain relief and fever reduction.   Make sure you are drinking plenty of fluids, especially water.  You can drink cranberry juice to help with symptom relief, but make sure it is cranberry juice and not cranberry cocktail.  You can also try AZO, cranberry pills, or pyridium as needed.    Return or go to the Emergency Department if symptoms worsen or do not improve in the next few days.   Follow up with your primary care providers as scheduled.

## 2021-06-30 NOTE — ED Triage Notes (Signed)
Pt presents with dysuria and urgency xs 3 days.

## 2021-07-02 LAB — URINE CULTURE: Culture: 80000 — AB

## 2021-07-04 ENCOUNTER — Ambulatory Visit (INDEPENDENT_AMBULATORY_CARE_PROVIDER_SITE_OTHER): Payer: Medicare HMO | Admitting: *Deleted

## 2021-07-04 ENCOUNTER — Other Ambulatory Visit: Payer: Self-pay | Admitting: Family Medicine

## 2021-07-04 DIAGNOSIS — Z0000 Encounter for general adult medical examination without abnormal findings: Secondary | ICD-10-CM | POA: Diagnosis not present

## 2021-07-04 DIAGNOSIS — M25552 Pain in left hip: Secondary | ICD-10-CM

## 2021-07-04 NOTE — Patient Instructions (Signed)
Elizabeth [name] , Thank you for taking time to come for your Medicare Wellness Visit. I appreciate your ongoing commitment to your health goals. Please review the following plan we discussed and let me know if I can assist you in the future.   Screening recommendations/referrals: Colonoscopy: no longer required Mammogram: up to date Bone Density: up to date Recommended yearly ophthalmology/optometry visit for glaucoma screening and checkup Recommended yearly dental visit for hygiene and checkup  Vaccinations: Influenza vaccine: up to date Pneumococcal vaccine: up to date Tdap vaccine: up to date Shingles vaccine: up to date    Advanced directives: education on file  Conditions/risks identified: na  Next appointment: 08-12-2021 @ 9:00 Dr. Birdie Riddle   Preventive Care 28 Years and Older, Female Preventive care refers to lifestyle choices and visits with your health care provider that can promote health and wellness. What does preventive care include? A yearly physical exam. This is also called an annual well check. Dental exams once or twice a year. Routine eye exams. Ask your health care provider how often you should have your eyes checked. Personal lifestyle choices, including: Daily care of your teeth and gums. Regular physical activity. Eating a healthy diet. Avoiding tobacco and drug use. Limiting alcohol use. Practicing safe sex. Taking low-dose aspirin every day. Taking vitamin and mineral supplements as recommended by your health care provider. What happens during an annual well check? The services and screenings done by your health care provider during your annual well check will depend on your age, overall health, lifestyle risk factors, and family history of disease. Counseling  Your health care provider may ask you questions about your: Alcohol use. Tobacco use. Drug use. Emotional well-being. Home and relationship well-being. Sexual activity. Eating  habits. History of falls. Memory and ability to understand (cognition). Work and work Statistician. Reproductive health. Screening  You may have the following tests or measurements: Height, weight, and BMI. Blood pressure. Lipid and cholesterol levels. These may be checked every 5 years, or more frequently if you are over 40 years old. Skin check. Lung cancer screening. You may have this screening every year starting at age 91 if you have a 30-pack-year history of smoking and currently smoke or have quit within the past 15 years. Fecal occult blood test (FOBT) of the stool. You may have this test every year starting at age 10. Flexible sigmoidoscopy or colonoscopy. You may have a sigmoidoscopy every 5 years or a colonoscopy every 10 years starting at age 51. Hepatitis C blood test. Hepatitis B blood test. Sexually transmitted disease (STD) testing. Diabetes screening. This is done by checking your blood sugar (glucose) after you have not eaten for a while (fasting). You may have this done every 1-3 years. Bone density scan. This is done to screen for osteoporosis. You may have this done starting at age 40. Mammogram. This may be done every 1-2 years. Talk to your health care provider about how often you should have regular mammograms. Talk with your health care provider about your test results, treatment options, and if necessary, the need for more tests. Vaccines  Your health care provider may recommend certain vaccines, such as: Influenza vaccine. This is recommended every year. Tetanus, diphtheria, and acellular pertussis (Tdap, Td) vaccine. You may need a Td booster every 10 years. Zoster vaccine. You may need this after age 49. Pneumococcal 13-valent conjugate (PCV13) vaccine. One dose is recommended after age 79. Pneumococcal polysaccharide (PPSV23) vaccine. One dose is recommended after age 8. Talk to your  health care provider about which screenings and vaccines you need and how  often you need them. This information is not intended to replace advice given to you by your health care provider. Make sure you discuss any questions you have with your health care provider. Document Released: 12/03/2015 Document Revised: 07/26/2016 Document Reviewed: 09/07/2015 Elsevier Interactive Patient Education  2017 [employer] Prevention in the Home Falls can cause injuries. They can happen to people of all ages. There are many things you can do to make your home safe and to help prevent falls. What can I do on the outside of my home? Regularly fix the edges of walkways and driveways and fix any cracks. Remove anything that might make you trip as you walk through a door, such as a raised step or threshold. Trim any bushes or trees on the path to your home. Use bright outdoor lighting. Clear any walking paths of anything that might make someone trip, such as rocks or tools. Regularly check to see if handrails are loose or broken. Make sure that both sides of any steps have handrails. Any raised decks and porches should have guardrails on the edges. Have any leaves, snow, or ice cleared regularly. Use sand or salt on walking paths during winter. Clean up any spills in your garage right away. This includes oil or grease spills. What can I do in the bathroom? Use night lights. Install grab bars by the toilet and in the tub and shower. Do not use towel bars as grab bars. Use non-skid mats or decals in the tub or shower. If you need to sit down in the shower, use a plastic, non-slip stool. Keep the floor dry. Clean up any water that spills on the floor as soon as it happens. Remove soap buildup in the tub or shower regularly. Attach bath mats securely with double-sided non-slip rug tape. Do not have throw rugs and other things on the floor that can make you trip. What can I do in the bedroom? Use night lights. Make sure that you have a light by your bed that is easy to  reach. Do not use any sheets or blankets that are too big for your bed. They should not hang down onto the floor. Have a firm chair that has side arms. You can use this for support while you get dressed. Do not have throw rugs and other things on the floor that can make you trip. What can I do in the kitchen? Clean up any spills right away. Avoid walking on wet floors. Keep items that you use a lot in easy-to-reach places. If you need to reach something above you, use a strong step stool that has a grab bar. Keep electrical cords out of the way. Do not use floor polish or wax that makes floors slippery. If you must use wax, use non-skid floor wax. Do not have throw rugs and other things on the floor that can make you trip. What can I do with my stairs? Do not leave any items on the stairs. Make sure that there are handrails on both sides of the stairs and use them. Fix handrails that are broken or loose. Make sure that handrails are as long as the stairways. Check any carpeting to make sure that it is firmly attached to the stairs. Fix any carpet that is loose or worn. Avoid having throw rugs at the top or bottom of the stairs. If you do have throw rugs, attach them  to the floor with carpet tape. Make sure that you have a light switch at the top of the stairs and the bottom of the stairs. If you do not have them, ask someone to add them for you. What else can I do to help prevent falls? Wear shoes that: Do not have high heels. Have rubber bottoms. Are comfortable and fit you well. Are closed at the toe. Do not wear sandals. If you use a stepladder: Make sure that it is fully opened. Do not climb a closed stepladder. Make sure that both sides of the stepladder are locked into place. Ask someone to hold it for you, if possible. Clearly mark and make sure that you can see: Any grab bars or handrails. First and last steps. Where the edge of each step is. Use tools that help you move  around (mobility aids) if they are needed. These include: Canes. Walkers. Scooters. Crutches. Turn on the lights when you go into a dark area. Replace any light bulbs as soon as they burn out. Set up your furniture so you have a clear path. Avoid moving your furniture around. If any of your floors are uneven, fix them. If there are any pets around you, be aware of where they are. Review your medicines with your doctor. Some medicines can make you feel dizzy. This can increase your chance of falling. Ask your doctor what other things that you can do to help prevent falls. This information is not intended to replace advice given to you by your health care provider. Make sure you discuss any questions you have with your health care provider. Document Released: 09/02/2009 Document Revised: 04/13/2016 Document Reviewed: 12/11/2014 Elsevier Interactive Patient Education  2017 [employer].

## 2021-07-04 NOTE — Progress Notes (Addendum)
I connected with  Elizabeth [name] on 07/04/21 by a  telephone enabled telemedicine application and verified that I am speaking with the correct person using two identifiers.   I discussed the limitations of evaluation and management by telemedicine. The patient expressed understanding and agreed to proceed.  Patient location: home  Provider location: telehealth     Review of Systems    NA Cardiac Risk Factors include: advanced age (>33mn, >>69women)     Objective:    Today's Vitals   07/04/21 0910  PainSc: 4    There is no height or weight on file to calculate BMI.  Advanced Directives 07/04/2021 06/28/2020 03/21/2018 08/24/2017 07/19/2017 05/11/2017 04/30/2017  Does Patient Have a Medical Advance Directive? Yes Yes Yes Yes Yes Yes Yes  Type of Advance Directive Living will [place] will [place] will Living will;Healthcare Power of [place] will  Does patient want to make changes to medical advance directive? Yes (Inpatient - patient requests chaplain consult to change a medical advance directive) - - - - - No - Patient declined  Copy of [place] Chart? - Yes - validated most recent copy scanned in chart (See row information) Yes No - copy requested - Yes No - copy requested    Current Medications (verified) Outpatient Encounter Medications as of 07/04/2021  Medication Sig   Biotin 5000 MCG TABS Take 1 tablet by mouth daily.   cephALEXin (KEFLEX) 500 MG capsule Take 1 capsule (500 mg total) by mouth 2 (two) times daily for 7 days.   Cholecalciferol (VITAMIN D-1000 MAX ST) 25 MCG (1000 UT) tablet Take by mouth.   gabapentin (NEURONTIN) 300 MG capsule TAKE 1 CAPSULE BY MOUTH 4 TIMES A DAY   montelukast (SINGULAIR) 10 MG tablet TAKE 1 TABLET BY MOUTH EVERYDAY AT BEDTIME   Multiple Vitamins-Minerals (CENTRUM SILVER 50+WOMEN  PO) Take by mouth.   omeprazole (PRILOSEC) 20 MG capsule Take 1 capsule (20 mg total) by mouth daily.   Probiotic Product (ADVANCED PROBIOTIC 10) CAPS Take 1 capsule by mouth daily. (Patient not taking: No sig reported)   No facility-administered encounter medications on file as of 07/04/2021.    Allergies (verified) Patient has no known allergies.   History: Past Medical History:  Diagnosis Date   Arthritis    Cancer ([facility]    pre cancer    Chicken pox    History of radiation therapy 06/26/17- 07/24/17   Left Breast 50.05 Gy total   Personal history of radiation therapy    Shingles 07-2002   Sleep apnea    mild uses oral devise   Urinary tract infection    Past Surgical History:  Procedure Laterality Date   BREAST BIOPSY Bilateral 03/07/2017   benign   BREAST BIOPSY Right 03/26/2017   benign   BREAST BIOPSY Left 03/26/2017   malignant   BREAST LUMPECTOMY Left 05/03/2017   BREAST LUMPECTOMY WITH RADIOACTIVE SEED LOCALIZATION Left 05/03/2017   Procedure: LEFT BREAST LUMPECTOMY WITH RADIOACTIVE SEED LOCALIZATION;  Surgeon: TJovita Kussmaul MD;  [facility]  Service: General;  Laterality: Left;   COLONOSCOPY     HYSTEROSCOPY     Dr LUbaldo Glassing  SPINAL FUSION  12/01/2016   T10 to Pelvis   UPPER GASTROINTESTINAL ENDOSCOPY  11/30/2020   WISDOM TOOTH EXTRACTION     age 80's  Family History  Problem Relation Age of  Onset   Diabetes Mother    Arthritis Father    Cancer Maternal Aunt        liver   Heart disease Paternal Grandfather    Prostate cancer Brother    Colon cancer Neg Hx    Pancreatic cancer Neg Hx    Stomach cancer Neg Hx    Social History   Socioeconomic History   Marital status: Married    Spouse name: Not on file   Number of children: 2   Years of education: Not on file   Highest education level: Not on file  Occupational History   Occupation: retired  Tobacco Use   Smoking status: Former    Types: Cigarettes    Quit date: 02/18/1981    Years since  quitting: 40.4   Smokeless tobacco: Never  Vaping Use   Vaping Use: Never used  Substance and Sexual Activity   Alcohol use: Yes    Alcohol/week: 2.0 standard drinks    Types: 2 Glasses of wine per week    Comment: 2 glasses 4 days/week   Drug use: No   Sexual activity: Yes  Other Topics Concern   Not on file  Social History Narrative   Not on file   Social Determinants of Health   Financial Resource Strain: Low Risk    Difficulty of Paying Living Expenses: Not hard at all  Food Insecurity: No Food Insecurity   Worried About Charity fundraiser in the Last Year: Never true   [place] in the Last Year: Never true  Transportation Needs: No Transportation Needs   Lack of Transportation (Medical): No   Lack of Transportation (Non-Medical): No  Physical Activity: Insufficiently Active   Days of Exercise per Week: 4 days   Minutes of Exercise per Session: 30 min  Stress: No Stress Concern Present   Feeling of Stress : Not at all  Social Connections: Socially Integrated   Frequency of Communication with Friends and Family: More than three times a week   Frequency of Social Gatherings with Friends and Family: More than three times a week   Attends Religious Services: More than 4 times per year   Active Member of [employer] or Organizations: Yes   Attends Archivist Meetings: 1 to 4 times per year   Marital Status: Married    Tobacco Counseling Counseling given: Not Answered   Clinical Intake:  Pre-visit preparation completed: Yes  Pain : 0-10 Pain Score: 4  Pain Location: Hip Pain Orientation: Left, Right Pain Descriptors / Indicators: Aching, Constant Pain Onset: 1 to 4 weeks ago Pain Frequency: Constant Pain Relieving Factors: dry needling, tylenol, injections every 3 months , ibuprofen  Pain Relieving Factors: dry needling, tylenol, injections every 3 months , ibuprofen  Nutritional Risks: None Diabetes: No  How often do you need to have someone  help you when you read instructions, pamphlets, or other written materials from your doctor or pharmacy?: 1 - Never  Diabetic?  NO  Interpreter Needed?: No  Information entered by :: Elizabeth Kennedy LPN   Activities of Daily Living In your present state of health, do you have any difficulty performing the following activities: 07/04/2021 04/06/2021  Hearing? Y N  Vision? N N  Difficulty concentrating or making decisions? N N  Walking or climbing stairs? N N  Dressing or bathing? N N  Doing errands, shopping? N N  Preparing Food and eating ? N -  Using the Toilet? N -  In  the past six months, have you accidently leaked urine? N -  Do you have problems with loss of bowel control? N -  Managing your Medications? N -  Managing your Finances? N -  Housekeeping or managing your Housekeeping? N -  Some recent data might be hidden    Patient Care Team: Midge Minium, MD as PCP - General (Family Medicine) Paula Compton, MD as Consulting Physician (Obstetrics and Gynecology) Gloris Manchester, MD (Neurosurgery) Regal, Tamala Fothergill, DPM as Consulting Physician (Podiatry) Marica Otter, Lyndon (Optometry) Jovita Kussmaul, MD as Consulting Physician (General Surgery) Nicholas Lose, MD as Consulting Physician (Hematology and Oncology) Eppie Gibson, MD as Attending Physician (Radiation Oncology) Gardenia Phlegm, NP as Nurse Practitioner (Hematology and Oncology) Irene Shipper, MD as Consulting Physician (Gastroenterology)  Indicate any recent Medical Services you may have received from other than [facility] providers in the past year (date may be approximate).     Assessment:   This is a routine wellness examination for Trysta.  Hearing/Vision screen Hearing Screening - Comments:: Hearing aids in both ears Vision Screening - Comments:: Up to date [place] issues and exercise activities discussed: Current Exercise Habits: Home exercise routine;Structured exercise class,  Type of exercise: strength training/weights;treadmill;walking, Time (Minutes): 30, Frequency (Times/Week): 4, Weekly Exercise (Minutes/Week): 120, Intensity: Moderate   Goals Addressed             This Visit's Progress    Patient Stated   On track    Increase walking after back pain controlled.      Patient Stated       Would like to get hip replacement so can exercise more       Depression Screen PHQ 2/9 Scores 07/04/2021 04/06/2021 08/09/2020 06/28/2020 08/06/2019 05/07/2019 09/25/2018  PHQ - 2 Score 0 0 0 0 0 0 0  PHQ- 9 Score - 0 0 - 0 0 0  Exception Documentation - - - - - - -    Fall Risk Fall Risk  07/04/2021 04/06/2021 08/09/2020 06/28/2020 08/06/2019  Falls in the past year? 0 0 0 0 0  Number falls in past yr: 0 0 0 0 0  Injury with Fall? 0 0 0 0 0  Risk for fall due to : - No Fall Risks - - -  Follow up Falls evaluation completed;Falls prevention discussed - Falls evaluation completed Falls prevention discussed -    FALL RISK PREVENTION PERTAINING TO THE HOME:  Any stairs in or around the home? Yes  If so, are there any without handrails? Yes  Home free of loose throw rugs in walkways, pet beds, electrical cords, etc? Yes  Adequate lighting in your home to reduce risk of falls? Yes   ASSISTIVE DEVICES UTILIZED TO PREVENT FALLS:  Life alert? No  Use of a cane, walker or w/c? No  Grab bars in the bathroom? Yes  Shower chair or bench in shower? Yes  Elevated toilet seat or a handicapped toilet? Yes   TIMED UP AND GO:  Was the test performed? No .   Cognitive Function: Normal cognitive status assessed by direct observation by this Nurse Health Advisor. No abnormalities found.    MMSE - Mini Mental State Exam 03/21/2018  Orientation to time 5  Orientation to Place 5  Registration 3  Attention/ Calculation 3  Recall 3  Language- name 2 objects 2  Language- repeat 1  Language- follow 3 step command 3  Language- read & follow direction 1  Write a sentence 1  Copy  design 1  Total score 28     6CIT Screen 06/28/2020  What Year? 0 points  What month? 0 points  What time? 0 points  Count back from 20 0 points  Months in reverse 0 points  Repeat phrase 0 points  Total Score 0    Immunizations Immunization History  Administered Date(s) Administered   Fluad Quad(high Dose 65+) 08/06/2019, 08/09/2020   Influenza, High Dose Seasonal PF 09/12/2013, 09/24/2015, 08/24/2016, 08/08/2018   Influenza,inj,Quad PF,6+ Mos 08/24/2014, 07/30/2017   Influenza-Unspecified 09/12/2013   PFIZER Comirnaty(Gray Top)Covid-19 Tri-Sucrose Vaccine 04/11/2021   PFIZER(Purple Top)SARS-COV-2 Vaccination 12/04/2019, 12/25/2019   Pneumococcal Conjugate-13 07/22/2008, 12/17/2014   Pneumococcal Polysaccharide-23 10/06/2016   Tetanus 10/08/2013   Zoster Recombinat (Shingrix) 11/07/2018, 05/21/2019   Zoster, Live 07/22/2008    TDAP status: Up to date  Flu Vaccine status: Up to date  Pneumococcal vaccine status: Up to date  Covid-19 vaccine status: Information provided on how to obtain vaccines.   Qualifies for Shingles Vaccine? No   Zostavax completed No   Shingrix Completed?: Yes  Screening Tests Health Maintenance  Topic Date Due   INFLUENZA VACCINE  06/20/2021   Hepatitis C Screening  08/09/2021 (Originally 12/15/1958)   COVID-19 Vaccine (4 - Booster for Pfizer series) 07/12/2021   TETANUS/TDAP  10/09/2023   DEXA SCAN  Completed   PNA vac Low Risk Adult  Completed   Zoster Vaccines- Shingrix  Completed   HPV VACCINES  Aged Out    Health Maintenance  Health Maintenance Due  Topic Date Due   INFLUENZA VACCINE  06/20/2021    Colorectal cancer screening: No longer required.   Mammogram status: Completed  . Repeat every year  Bone Density status: Completed  . Results reflect: Bone density results: OSTEOPOROSIS. Repeat every 2 years.  Lung Cancer Screening: (Low Dose CT Chest recommended if Age 82-80 years, 30 pack-year currently smoking OR have quit  w/in 15years.) does not qualify.   Lung Cancer Screening Referral:    Additional Screening:  Hepatitis C Screening: does not qualify  Vision Screening: Recommended annual ophthalmology exams for early detection of glaucoma and other disorders of the eye. Is the patient up to date with their annual eye exam?  Yes  Who is the provider or what is the name of the office in which the patient attends annual eye exams? Dr. Marica Otter If pt is not established with a provider, would they like to be referred to a provider to establish care? No .   Dental Screening: Recommended annual dental exams for proper oral hygiene  Community Resource Referral / Chronic Care Management: CRR required this visit?  No   CCM required this visit?  No      Plan:     I have personally reviewed and noted the following in the patient's chart:   Medical and social history Use of alcohol, tobacco or illicit drugs  Current medications and supplements including opioid prescriptions.  Functional ability and status Nutritional status Physical activity Advanced directives List of other physicians Hospitalizations, surgeries, and ER visits in previous 12 months Vitals Screenings to include cognitive, depression, and falls Referrals and appointments  In addition, I have reviewed and discussed with patient certain preventive protocols, quality metrics, and best practice recommendations. A written personalized care plan for preventive services as well as general preventive health recommendations were provided to patient.     Elizabeth Kennedy, LPN   [id number]   Nurse Notes: na

## 2021-07-06 ENCOUNTER — Other Ambulatory Visit: Payer: Self-pay

## 2021-07-06 ENCOUNTER — Ambulatory Visit (INDEPENDENT_AMBULATORY_CARE_PROVIDER_SITE_OTHER): Payer: Medicare HMO | Admitting: Family Medicine

## 2021-07-06 VITALS — BP 134/76 | HR 77 | Temp 98.1°F | Resp 15 | Ht 66.0 in | Wt 138.8 lb

## 2021-07-06 DIAGNOSIS — R300 Dysuria: Secondary | ICD-10-CM | POA: Diagnosis not present

## 2021-07-06 DIAGNOSIS — N390 Urinary tract infection, site not specified: Secondary | ICD-10-CM | POA: Diagnosis not present

## 2021-07-06 LAB — POCT URINALYSIS DIPSTICK
Bilirubin, UA: NEGATIVE
Blood, UA: NEGATIVE
Glucose, UA: NEGATIVE
Ketones, UA: NEGATIVE
Leukocytes, UA: NEGATIVE
Nitrite, UA: NEGATIVE
Protein, UA: POSITIVE — AB
Spec Grav, UA: 1.015 (ref 1.010–1.025)
Urobilinogen, UA: 0.2 E.U./dL
pH, UA: 6 (ref 5.0–8.0)

## 2021-07-06 MED ORDER — CEPHALEXIN 500 MG PO CAPS: 500.0000 mg | ORAL_CAPSULE | Freq: Two times a day (BID) | ORAL | 0 refills | Status: DC

## 2021-07-06 NOTE — Patient Instructions (Addendum)
Urine test looks ok in office today. I will extend keflex for 3 more days, but if any continued or worsening symptoms, please be seen. Ok to use pyridium again temporarily as well. I will refer you to urology to discuss recurrent UTI's as well.   Return to the clinic or go to the nearest emergency room if any of your symptoms worsen or new symptoms occur.   Urinary Tract Infection, Adult  A urinary tract infection (UTI) is an infection of any part of the urinary tract. The urinary tract includes the kidneys, ureters, bladder, and urethra.These organs make, store, and get rid of urine in the body. An upper UTI affects the ureters and kidneys. A lower UTI affects the bladderand urethra. What are the causes? Most urinary tract infections are caused by bacteria in your genital area around your urethra, where urine leaves your body. These bacteria grow andcause inflammation of your urinary tract. What increases the risk? You are more likely to develop this condition if: You have a urinary catheter that stays in place. You are not able to control when you urinate or have a bowel movement (incontinence). You are female and you: Use a spermicide or diaphragm for birth control. Have low estrogen levels. Are pregnant. You have certain genes that increase your risk. You are sexually active. You take antibiotic medicines. You have a condition that causes your flow of urine to slow down, such as: An enlarged prostate, if you are female. Blockage in your urethra. A kidney stone. A nerve condition that affects your bladder control (neurogenic bladder). Not getting enough to drink, or not urinating often. You have certain medical conditions, such as: Diabetes. A weak disease-fighting system (immunesystem). Sickle cell disease. Gout. Spinal cord injury. What are the signs or symptoms? Symptoms of this condition include: Needing to urinate right away (urgency). Frequent urination. This may include  small amounts of urine each time you urinate. Pain or burning with urination. Blood in the urine. Urine that smells bad or unusual. Trouble urinating. Cloudy urine. Vaginal discharge, if you are female. Pain in the abdomen or the lower back. You may also have: Vomiting or a decreased appetite. Confusion. Irritability or tiredness. A fever or chills. Diarrhea. The first symptom in older adults may be confusion. In some cases, they may nothave any symptoms until the infection has worsened. How is this diagnosed? This condition is diagnosed based on your medical history and a physical exam. You may also have other tests, including: Urine tests. Blood tests. Tests for STIs (sexually transmitted infections). If you have had more than one UTI, a cystoscopy or imaging studies may be doneto determine the cause of the infections. How is this treated? Treatment for this condition includes: Antibiotic medicine. Over-the-counter medicines to treat discomfort. Drinking enough water to stay hydrated. If you have frequent infections or have other conditions such as a kidney stone, you may need to see a health care provider who specializes in the urinary tract (urologist). In rare cases, urinary tract infections can cause sepsis. Sepsis is a life-threatening condition that occurs when the body responds to an infection. Sepsis is treated in the hospital with IV antibiotics, fluids, and othermedicines. Follow these instructions at home:  Medicines Take over-the-counter and prescription medicines only as told by your health care provider. If you were prescribed an antibiotic medicine, take it as told by your health care provider. Do not stop using the antibiotic even if you start to feel better. General instructions Make sure you:  Empty your bladder often and completely. Do not hold urine for long periods of time. Empty your bladder after sex. Wipe from front to back after urinating or having a  bowel movement if you are female. Use each tissue only one time when you wipe. Drink enough fluid to keep your urine pale yellow. Keep all follow-up visits. This is important. Contact a health care provider if: Your symptoms do not get better after 1-2 days. Your symptoms go away and then return. Get help right away if: You have severe pain in your back or your lower abdomen. You have a fever or chills. You have nausea or vomiting. Summary A urinary tract infection (UTI) is an infection of any part of the urinary tract, which includes the kidneys, ureters, bladder, and urethra. Most urinary tract infections are caused by bacteria in your genital area. Treatment for this condition often includes antibiotic medicines. If you were prescribed an antibiotic medicine, take it as told by your health care provider. Do not stop using the antibiotic even if you start to feel better. Keep all follow-up visits. This is important. This information is not intended to replace advice given to you by your health care provider. Make sure you discuss any questions you have with your healthcare provider. Document Revised: 06/18/2020 Document Reviewed: 06/18/2020 Elsevier Patient Education  [place].

## 2021-07-06 NOTE — Progress Notes (Signed)
Subjective:  Patient ID: Elizabeth [name], female    DOB: [birth year]  Age: 80 y.o. MRN: [id number]  CC:  Chief Complaint  Patient presents with   Dysuria    Pt here today due to recurrent uti pt reports some urgency, denies burning, reports some discomfort with urination, pt reports taking abx and sxs are worse today than before, pt reports chronic back pain unable to tell if any new or different pains     HPI Elizabeth [name] presents for   Dysuria; History of recurrent urinary tract infections. Last urology eval years ago.   Klebsiella UTI 5/18 with Dr. Birdie Riddle, treated with keflex.   Chart reviewed.  Treated June 25 through urgent care for urinary frequency, urgency, dysuria.  Moderate leukocytes on urinalysis at that time, greater than 100,000 colonies of Klebsiella.  Treated with Keflex 500 mg twice daily for 7 days.  Sensitive culture. Symtpoms resolved.   Evaluated through urgent care again on August 11 with 3 days of urgency, dysuria, frequency.  Urinalysis again with moderate LE, large blood.  Urine culture 80,000 colonies Klebsiella treated again with 1 week course of Keflex 500 mg twice daily.  Culture with resistance to ampicillin, intermediate resistance to Macrobid  but otherwise sensitive.  Improved from recent antibiotics, but still frequency, urgency - better one day then returns the next. On last day of antibiotics. Felt better for a few days, then more symptoms yesterday. More urgency, discomfort in lower area.  Some increased stressors past few months, but not depressed, handling stressors ok.   No f/c/n/v. No abd pain. History of chronic back pain.  No apparent new back pains. Using Replens for vaginal dryness - 2-3 times per week, after showering. Vaginal dryness controlled.    History Patient Active Problem List   Diagnosis Date Noted   Pain of left hip joint 01/24/2021   Dysphagia 11/02/2020   Diarrhea 11/02/2020   Sensorineural hearing loss  (SNHL) of both ears 06/24/2019   Malignant neoplasm of lower-outer quadrant of left breast of female, estrogen receptor positive ([place]) 04/03/2017   Apnea, sleep 11/03/2016   Lumbar back pain 12/23/2015   Routine general medical examination at a health care facility 10/08/2013   Chronic back pain 12/26/2012   Blood glucose elevated 12/26/2012   Past Medical History:  Diagnosis Date   Arthritis    Cancer ([place])    pre cancer    Chicken pox    History of radiation therapy 06/26/17- 07/24/17   Left Breast 50.05 Gy total   Personal history of radiation therapy    Shingles 07-2002   Sleep apnea    mild uses oral devise   Urinary tract infection    Past Surgical History:  Procedure Laterality Date   BREAST BIOPSY Bilateral 03/07/2017   benign   BREAST BIOPSY Right 03/26/2017   benign   BREAST BIOPSY Left 03/26/2017   malignant   BREAST LUMPECTOMY Left 05/03/2017   BREAST LUMPECTOMY WITH RADIOACTIVE SEED LOCALIZATION Left 05/03/2017   Procedure: LEFT BREAST LUMPECTOMY WITH RADIOACTIVE SEED LOCALIZATION;  Surgeon: Jovita Kussmaul, MD;  Location: [facility];  Service: General;  Laterality: Left;   COLONOSCOPY     HYSTEROSCOPY     Dr Ubaldo Glassing   SPINAL FUSION  12/01/2016   T10 to Pelvis   UPPER GASTROINTESTINAL ENDOSCOPY  11/30/2020   WISDOM TOOTH EXTRACTION     age 77's   No Known Allergies Prior to Admission medications   Medication Sig Start  Date End Date Taking? Authorizing Provider  Biotin 5000 MCG TABS Take 1 tablet by mouth daily.    [provider]  cephALEXin (KEFLEX) 500 MG capsule Take 1 capsule (500 mg total) by mouth 2 (two) times daily for 7 days. 06/30/21 07/07/21  Pearson Forster, NP  Cholecalciferol (VITAMIN D-1000 MAX ST) 25 MCG (1000 UT) tablet Take by mouth.    [provider]  gabapentin (NEURONTIN) 300 MG capsule TAKE 1 CAPSULE BY MOUTH 4 TIMES A DAY 06/30/21   Midge Minium, MD  montelukast (SINGULAIR) 10 MG tablet TAKE 1 TABLET BY MOUTH EVERYDAY AT  BEDTIME 05/09/21   Midge Minium, MD  Multiple Vitamins-Minerals (CENTRUM SILVER 50+WOMEN PO) Take by mouth.    [provider]  omeprazole (PRILOSEC) 20 MG capsule Take 1 capsule (20 mg total) by mouth daily. 01/12/21   Irene Shipper, MD  Probiotic Product (ADVANCED PROBIOTIC 10) CAPS Take 1 capsule by mouth daily. Patient not taking: No sig reported    [provider]   Social History   Socioeconomic History   Marital status: Married    Spouse name: Not on file   Number of children: 2   Years of education: Not on file   Highest education level: Not on file  Occupational History   Occupation: retired  Tobacco Use   Smoking status: Former    Types: Cigarettes    Quit date: 02/18/1981    Years since quitting: 40.4   Smokeless tobacco: Never  Vaping Use   Vaping Use: Never used  Substance and Sexual Activity   Alcohol use: Yes    Alcohol/week: 2.0 standard drinks    Types: 2 Glasses of wine per week    Comment: 2 glasses 4 days/week   Drug use: No   Sexual activity: Yes  Other Topics Concern   Not on file  Social History Narrative   Not on file   Social Determinants of Health   Financial Resource Strain: Low Risk    Difficulty of Paying Living Expenses: Not hard at all  Food Insecurity: No Food Insecurity   Worried About Charity fundraiser in the Last Year: Never true   [place] in the Last Year: Never true  Transportation Needs: No Transportation Needs   Lack of Transportation (Medical): No   Lack of Transportation (Non-Medical): No  Physical Activity: Insufficiently Active   Days of Exercise per Week: 4 days   Minutes of Exercise per Session: 30 min  Stress: No Stress Concern Present   Feeling of Stress : Not at all  Social Connections: Socially Integrated   Frequency of Communication with Friends and Family: More than three times a week   Frequency of Social Gatherings with Friends and Family: More than three times a week   Attends  Religious Services: More than 4 times per year   Active Member of [employer] or Organizations: Yes   Attends Archivist Meetings: 1 to 4 times per year   Marital Status: Married  Human resources officer Violence: Not At Risk   Fear of Current or Ex-Partner: No   Emotionally Abused: No   Physically Abused: No   Sexually Abused: No    Review of Systems PerHPI.   Objective:   Vitals:   07/06/21 1146  BP: 134/76  Pulse: 77  Resp: 15  Temp: 98.1 F (36.7 C)  TempSrc: Temporal  SpO2: 98%  Weight: 138 lb 12.8 oz (63 kg)  Height: 5'  6" (1.676 m)     Physical Exam Constitutional:      Appearance: Normal appearance. She is well-developed.  HENT:     Head: Normocephalic and atraumatic.  Pulmonary:     Effort: Pulmonary effort is normal.  Abdominal:     General: There is no distension.     Palpations: Abdomen is soft.     Tenderness: There is no abdominal tenderness. There is no right CVA tenderness, left CVA tenderness, guarding or rebound.  Skin:    General: Skin is warm.  Neurological:     Mental Status: She is alert and oriented to person, place, and time.  Psychiatric:        Mood and Affect: Mood normal.        Behavior: Behavior normal.     Results for orders placed or performed in visit on 07/06/21  POCT Urinalysis Dipstick  Result Value Ref Range   Color, UA yellow    Clarity, UA clear    Glucose, UA Negative Negative   Bilirubin, UA negative    Ketones, UA negative    Spec Grav, UA 1.015 1.010 - 1.025   Blood, UA negative    pH, UA 6.0 5.0 - 8.0   Protein, UA Positive (A) Negative   Urobilinogen, UA 0.2 0.2 or 1.0 E.U./dL   Nitrite, UA negative    Leukocytes, UA Negative Negative   Appearance     Odor       Assessment & Plan:  Brissa Solis is a 80 y.o. female . Dysuria - Plan: POCT Urinalysis Dipstick, Urine Culture, Ambulatory referral to Urology, cephALEXin (KEFLEX) 500 MG capsule  Recurrent UTI - Plan: Urine Culture, Ambulatory  referral to Urology, cephALEXin (KEFLEX) 500 MG capsule  In office urinalysis reassuring.  Keflex extended for 3 additional days for 10-day course for what appears to be recurrent Klebsiella UTI that has had symptom-free intervals.  Refer to urology due to recurrent UTI, check urine culture, RTC precautions.  Symptomatic care with Pyridium temporarily if needed.  Continue to push fluids. Meds ordered this encounter  Medications   cephALEXin (KEFLEX) 500 MG capsule    Sig: Take 1 capsule (500 mg total) by mouth 2 (two) times daily. Add to prior rx for 10 day total course.    Dispense:  6 capsule    Refill:  0   Patient Instructions  Urine test looks ok in office today. I will extend keflex for 3 more days, but if any continued or worsening symptoms, please be seen. Ok to use pyridium again temporarily as well. I will refer you to urology to discuss recurrent UTI's as well.   Return to the clinic or go to the nearest emergency room if any of your symptoms worsen or new symptoms occur.   Urinary Tract Infection, Adult  A urinary tract infection (UTI) is an infection of any part of the urinary tract. The urinary tract includes the kidneys, ureters, bladder, and urethra.These organs make, store, and get rid of urine in the body. An upper UTI affects the ureters and kidneys. A lower UTI affects the bladderand urethra. What are the causes? Most urinary tract infections are caused by bacteria in your genital area around your urethra, where urine leaves your body. These bacteria grow andcause inflammation of your urinary tract. What increases the risk? You are more likely to develop this condition if: You have a urinary catheter that stays in place. You are not able to control when you urinate  or have a bowel movement (incontinence). You are female and you: Use a spermicide or diaphragm for birth control. Have low estrogen levels. Are pregnant. You have certain genes that increase your  risk. You are sexually active. You take antibiotic medicines. You have a condition that causes your flow of urine to slow down, such as: An enlarged prostate, if you are female. Blockage in your urethra. A kidney stone. A nerve condition that affects your bladder control (neurogenic bladder). Not getting enough to drink, or not urinating often. You have certain medical conditions, such as: Diabetes. A weak disease-fighting system (immunesystem). Sickle cell disease. Gout. Spinal cord injury. What are the signs or symptoms? Symptoms of this condition include: Needing to urinate right away (urgency). Frequent urination. This may include small amounts of urine each time you urinate. Pain or burning with urination. Blood in the urine. Urine that smells bad or unusual. Trouble urinating. Cloudy urine. Vaginal discharge, if you are female. Pain in the abdomen or the lower back. You may also have: Vomiting or a decreased appetite. Confusion. Irritability or tiredness. A fever or chills. Diarrhea. The first symptom in older adults may be confusion. In some cases, they may nothave any symptoms until the infection has worsened. How is this diagnosed? This condition is diagnosed based on your medical history and a physical exam. You may also have other tests, including: Urine tests. Blood tests. Tests for STIs (sexually transmitted infections). If you have had more than one UTI, a cystoscopy or imaging studies may be doneto determine the cause of the infections. How is this treated? Treatment for this condition includes: Antibiotic medicine. Over-the-counter medicines to treat discomfort. Drinking enough water to stay hydrated. If you have frequent infections or have other conditions such as a kidney stone, you may need to see a health care provider who specializes in the urinary tract (urologist). In rare cases, urinary tract infections can cause sepsis. Sepsis is a life-threatening  condition that occurs when the body responds to an infection. Sepsis is treated in the hospital with IV antibiotics, fluids, and othermedicines. Follow these instructions at home:  Medicines Take over-the-counter and prescription medicines only as told by your health care provider. If you were prescribed an antibiotic medicine, take it as told by your health care provider. Do not stop using the antibiotic even if you start to feel better. General instructions Make sure you: Empty your bladder often and completely. Do not hold urine for long periods of time. Empty your bladder after sex. Wipe from front to back after urinating or having a bowel movement if you are female. Use each tissue only one time when you wipe. Drink enough fluid to keep your urine pale yellow. Keep all follow-up visits. This is important. Contact a health care provider if: Your symptoms do not get better after 1-2 days. Your symptoms go away and then return. Get help right away if: You have severe pain in your back or your lower abdomen. You have a fever or chills. You have nausea or vomiting. Summary A urinary tract infection (UTI) is an infection of any part of the urinary tract, which includes the kidneys, ureters, bladder, and urethra. Most urinary tract infections are caused by bacteria in your genital area. Treatment for this condition often includes antibiotic medicines. If you were prescribed an antibiotic medicine, take it as told by your health care provider. Do not stop using the antibiotic even if you start to feel better. Keep all follow-up visits. This is  important. This information is not intended to replace advice given to you by your health care provider. Make sure you discuss any questions you have with your healthcare provider. Document Revised: 06/18/2020 Document Reviewed: 06/18/2020 Elsevier Patient Education  2022 [place],   Merri Ray, MD [place],  [facility] 07/08/21 11:42 AM

## 2021-07-08 ENCOUNTER — Encounter: Payer: Self-pay | Admitting: Family Medicine

## 2021-07-08 LAB — URINE CULTURE
MICRO NUMBER:: 12257498
SPECIMEN QUALITY:: ADEQUATE

## 2021-07-09 ENCOUNTER — Other Ambulatory Visit: Payer: Self-pay | Admitting: Family Medicine

## 2021-07-09 MED ORDER — SULFAMETHOXAZOLE-TRIMETHOPRIM 800-160 MG PO TABS: 1.0000 | ORAL_TABLET | Freq: Two times a day (BID) | ORAL | 0 refills | Status: DC

## 2021-08-04 DIAGNOSIS — M25551 Pain in right hip: Secondary | ICD-10-CM | POA: Diagnosis not present

## 2021-08-04 DIAGNOSIS — M25552 Pain in left hip: Secondary | ICD-10-CM | POA: Diagnosis not present

## 2021-08-10 DIAGNOSIS — M25552 Pain in left hip: Secondary | ICD-10-CM | POA: Diagnosis not present

## 2021-08-10 DIAGNOSIS — M25551 Pain in right hip: Secondary | ICD-10-CM | POA: Diagnosis not present

## 2021-08-12 ENCOUNTER — Other Ambulatory Visit: Payer: Self-pay

## 2021-08-12 ENCOUNTER — Ambulatory Visit (INDEPENDENT_AMBULATORY_CARE_PROVIDER_SITE_OTHER): Payer: Medicare HMO | Admitting: Family Medicine

## 2021-08-12 ENCOUNTER — Encounter: Payer: Self-pay | Admitting: Family Medicine

## 2021-08-12 VITALS — BP 122/82 | Temp 97.9°F | Ht 66.25 in | Wt 137.6 lb

## 2021-08-12 DIAGNOSIS — Z853 Personal history of malignant neoplasm of breast: Secondary | ICD-10-CM | POA: Diagnosis not present

## 2021-08-12 DIAGNOSIS — E785 Hyperlipidemia, unspecified: Secondary | ICD-10-CM

## 2021-08-12 DIAGNOSIS — Z1159 Encounter for screening for other viral diseases: Secondary | ICD-10-CM

## 2021-08-12 DIAGNOSIS — Z0000 Encounter for general adult medical examination without abnormal findings: Secondary | ICD-10-CM | POA: Diagnosis not present

## 2021-08-12 DIAGNOSIS — Z23 Encounter for immunization: Secondary | ICD-10-CM

## 2021-08-12 LAB — BASIC METABOLIC PANEL WITH GFR
BUN: 17 mg/dL (ref 6–23)
CO2: 28 meq/L (ref 19–32)
Calcium: 10.4 mg/dL (ref 8.4–10.5)
Chloride: 97 meq/L (ref 96–112)
Creatinine, Ser: 0.75 mg/dL (ref 0.40–1.20)
GFR: 76.13 mL/min
Glucose, Bld: 95 mg/dL (ref 70–99)
Potassium: 4.9 meq/L (ref 3.5–5.1)
Sodium: 132 meq/L — ABNORMAL LOW (ref 135–145)

## 2021-08-12 LAB — LIPID PANEL
Cholesterol: 220 mg/dL — ABNORMAL HIGH (ref 0–200)
HDL: 106.8 mg/dL (ref >39.00)
LDL Cholesterol: 90 mg/dL (ref 0–99)
NonHDL: 113.06
Total CHOL/HDL Ratio: 2
Triglycerides: 116 mg/dL (ref 0.0–149.0)
VLDL: 23.2 mg/dL (ref 0.0–40.0)

## 2021-08-12 LAB — CBC WITH DIFFERENTIAL/PLATELET
Basophils Absolute: 0.1 10*3/uL (ref 0.0–0.1)
Basophils Relative: 0.8 % (ref 0.0–3.0)
Eosinophils Absolute: 0 10*3/uL (ref 0.0–0.7)
Eosinophils Relative: 0 % (ref 0.0–5.0)
HCT: 37.4 % (ref 36.0–46.0)
Hemoglobin: 12.6 g/dL (ref 12.0–15.0)
Lymphocytes Relative: 4.3 % — ABNORMAL LOW (ref 12.0–46.0)
Lymphs Abs: 0.4 10*3/uL — ABNORMAL LOW (ref 0.7–4.0)
MCHC: 33.6 g/dL (ref 30.0–36.0)
MCV: 98.3 fl (ref 78.0–100.0)
Monocytes Absolute: 0.6 10*3/uL (ref 0.1–1.0)
Monocytes Relative: 6.3 % (ref 3.0–12.0)
Neutro Abs: 8.7 10*3/uL — ABNORMAL HIGH (ref 1.4–7.7)
Neutrophils Relative %: 88.6 % — ABNORMAL HIGH (ref 43.0–77.0)
Platelets: 326 10*3/uL (ref 150.0–400.0)
RBC: 3.8 Mil/uL — ABNORMAL LOW (ref 3.87–5.11)
RDW: 12.7 % (ref 11.5–15.5)
WBC: 9.8 10*3/uL (ref 4.0–10.5)

## 2021-08-12 LAB — HEPATIC FUNCTION PANEL
ALT: 21 U/L (ref 0–35)
AST: 19 U/L (ref 0–37)
Albumin: 4.9 g/dL (ref 3.5–5.2)
Alkaline Phosphatase: 56 U/L (ref 39–117)
Bilirubin, Direct: 0.1 mg/dL (ref 0.0–0.3)
Total Bilirubin: 0.7 mg/dL (ref 0.2–1.2)
Total Protein: 7.6 g/dL (ref 6.0–8.3)

## 2021-08-12 LAB — TSH: TSH: 2.61 u[IU]/mL (ref 0.35–5.50)

## 2021-08-12 NOTE — Progress Notes (Signed)
Subjective:    Patient ID: Elizabeth [name], female    DOB: 1941/05/20, 80 y.o.   MRN: [id number]  HPI CPE- UTD on pap, mammo, colonoscopy.  UTD on pneumonia vaccines.  Due for flu.  Patient Care Team    Relationship Specialty Notifications Start End  Midge Minium, MD PCP - General Family Medicine  12/26/12   Paula Compton, MD Consulting Physician Obstetrics and Gynecology  03/01/15   Gloris Manchester, MD  Neurosurgery  03/15/17   Wallene Huh, [place] Consulting Physician Podiatry  03/15/17   Marica Otter, [place]  Optometry  03/15/17   Jovita Kussmaul, MD Consulting Physician General Surgery  04/03/17   Nicholas Lose, MD Consulting Physician Hematology and Oncology  04/03/17   Eppie Gibson, MD Attending Physician Radiation Oncology  04/03/17   Gardenia Phlegm, NP Nurse Practitioner Hematology and Oncology  08/15/17   Irene Shipper, MD Consulting Physician Gastroenterology  08/09/20      Health Maintenance  Topic Date Due   Hepatitis C Screening  Never done   INFLUENZA VACCINE  06/20/2021   COVID-19 Vaccine (4 - Booster for Pfizer series) 07/04/2021   TETANUS/TDAP  10/09/2023   DEXA SCAN  Completed   Zoster Vaccines- Shingrix  Completed   HPV VACCINES  Aged Out      Review of Systems Patient reports no vision/ hearing changes, adenopathy,fever, weight change,  persistant/recurrent hoarseness , swallowing issues, chest pain, palpitations, edema, persistant/recurrent cough, hemoptysis, dyspnea (rest/exertional/paroxysmal nocturnal), gastrointestinal bleeding (melena, rectal bleeding), abdominal pain, significant heartburn, bowel changes, GU symptoms (dysuria, hematuria, incontinence), Gyn symptoms (abnormal  bleeding, pain),  syncope, focal weakness, memory loss, numbness & tingling, skin/hair/nail changes, abnormal bruising or bleeding, anxiety, or depression.   This visit occurred during the SARS-CoV-2 public health emergency.  Safety protocols were in place,  including screening questions prior to the visit, additional usage of staff PPE, and extensive cleaning of exam room while observing appropriate contact time as indicated for disinfecting solutions.      Objective:   Physical Exam General Appearance:    Alert, cooperative, no distress, appears stated age  Head:    Normocephalic, without obvious abnormality, atraumatic  Eyes:    PERRL, conjunctiva/corneas clear, EOM's intact, fundi    benign, both eyes  Ears:    Normal TM's and external ear canals, both ears  Nose:   Deferred due to COVID  Throat:   Neck:   Supple, symmetrical, trachea midline, no adenopathy;    Thyroid: no enlargement/tenderness/nodules  Back:     Symmetric, no curvature, ROM normal, no CVA tenderness  Lungs:     Clear to auscultation bilaterally, respirations unlabored  Chest Wall:    No tenderness or deformity   Heart:    Regular rate and rhythm, S1 and S2 normal, no murmur, rub   or gallop  Breast Exam:    Deferred to GYN  Abdomen:     Soft, non-tender, bowel sounds active all four quadrants,    no masses, no organomegaly  Genitalia:    Deferred to GYN  Rectal:    Extremities:   Extremities normal, atraumatic, no cyanosis or edema  Pulses:   2+ and symmetric all extremities  Skin:   Skin color, texture, turgor normal, no rashes or lesions  Lymph nodes:   Cervical, supraclavicular, and axillary nodes normal  Neurologic:   CNII-XII intact, normal strength, sensation and reflexes    throughout  Assessment & Plan:

## 2021-08-12 NOTE — Patient Instructions (Signed)
Follow up in 1 year or as needed We'll notify you of your lab results and make any changes if needed Continue to work on healthy diet and regular exercise- you look great!! Take the Singulair and the Claritin together to help w/ the allergies Call with any questions or concerns GOOD LUCK WITH THE MOVE!!!

## 2021-08-12 NOTE — Assessment & Plan Note (Signed)
Pt's PE WNL.  UTD on pap, mammo, colonoscopy.  UTD on Tdap, pneumonia.  Flu shot given today.  Check labs.  Anticipatory guidance provided.

## 2021-08-12 NOTE — Assessment & Plan Note (Signed)
UTD on mammo

## 2021-08-15 LAB — HEPATITIS C ANTIBODY
Hepatitis C Ab: NONREACTIVE
SIGNAL TO CUT-OFF: 0.01 (ref <1.00)

## 2021-09-02 ENCOUNTER — Other Ambulatory Visit: Payer: Self-pay

## 2021-09-02 ENCOUNTER — Telehealth (INDEPENDENT_AMBULATORY_CARE_PROVIDER_SITE_OTHER): Payer: Medicare HMO | Admitting: Registered Nurse

## 2021-09-02 ENCOUNTER — Ambulatory Visit: Payer: Self-pay | Admitting: Registered Nurse

## 2021-09-02 ENCOUNTER — Encounter: Payer: Self-pay | Admitting: Registered Nurse

## 2021-09-02 DIAGNOSIS — N390 Urinary tract infection, site not specified: Secondary | ICD-10-CM

## 2021-09-02 MED ORDER — SULFAMETHOXAZOLE-TRIMETHOPRIM 800-160 MG PO TABS: 1.0000 | ORAL_TABLET | Freq: Two times a day (BID) | ORAL | 0 refills | Status: DC

## 2021-09-02 NOTE — Progress Notes (Signed)
Telemedicine Encounter- SOAP NOTE Established Patient  This telephone encounter was conducted with the patient's (or proxy's) verbal consent via audio telecommunications: yes/no: Yes Patient was instructed to have this encounter in a suitably private space; and to only have persons present to whom they give permission to participate. In addition, patient identity was confirmed by use of name plus two identifiers (DOB and address).  I discussed the limitations, risks, security and privacy concerns of performing an evaluation and management service by telephone and the availability of in person appointments. I also discussed with the patient that there may be a patient responsible charge related to this service. The patient expressed understanding and agreed to proceed.  I spent a total of 12 minutes talking with the patient or their proxy.  Patient at home Provider in office  Participants: Kathrin Ruddy, NP and Trudie Reed  Chief Complaint  Patient presents with   Urinary Tract Infection    Patient state she's has another UTI.She is experiencing some discomfort and and a lot of urine frequency. She has an appointment with urology at the end of the month but need something soon.     Subjective   Elizabeth [name] is a 80 y.o. established patient. Telephone visit today for UTI  HPI Ongoing symptoms for some time. Seen by Dr. Carlota Raspberry in Aug Tx with bactrim po bid x 5 days.  Having urgency, frequency. No pain Denies lower back/flank pain, constitutional symptoms, or cognitive changes.  Has some azo- plans to use soon Takes ibuprofen and acetaminophen for hip pain - due for hip replacements. Cortisone shots in sept. - plans to replace hips in   Patient Active Problem List   Diagnosis Date Noted   Hx of breast cancer 08/12/2021   Pain of left hip joint 01/24/2021   Dysphagia 11/02/2020   Diarrhea 11/02/2020   Sensorineural hearing loss (SNHL) of both ears  06/24/2019   Apnea, sleep 11/03/2016   Lumbar back pain 12/23/2015   Routine general medical examination at a health care facility 10/08/2013   Chronic back pain 12/26/2012   Blood glucose elevated 12/26/2012    Past Medical History:  Diagnosis Date   Arthritis    Cancer ([place])    pre cancer    Chicken pox    History of radiation therapy 06/26/17- 07/24/17   Left Breast 50.05 Gy total   Personal history of radiation therapy    Shingles 07-2002   Sleep apnea    mild uses oral devise   Urinary tract infection     Current Outpatient Medications  Medication Sig Dispense Refill   Biotin 5000 MCG TABS Take 1 tablet by mouth daily.     celecoxib (CELEBREX) 100 MG capsule Take 100 mg by mouth 2 (two) times daily.     cholecalciferol (VITAMIN D3) 25 MCG (1000 UNIT) tablet Vitamin D     gabapentin (NEURONTIN) 300 MG capsule TAKE 1 CAPSULE BY MOUTH 4 TIMES A DAY 360 capsule 0   montelukast (SINGULAIR) 10 MG tablet TAKE 1 TABLET BY MOUTH EVERYDAY AT BEDTIME 90 tablet 1   Multiple Vitamins-Minerals (CENTRUM SILVER 50+WOMEN PO) Take by mouth.     omeprazole (PRILOSEC) 20 MG capsule Take 1 capsule (20 mg total) by mouth daily. 90 capsule 3   Probiotic Product (ADVANCED PROBIOTIC 10) CAPS Take 1 capsule by mouth daily.     sulfamethoxazole-trimethoprim (BACTRIM DS) 800-160 MG tablet Take 1 tablet by mouth 2 (two) times daily. 10 tablet  0   No current facility-administered medications for this visit.    No Known Allergies  Social History   Socioeconomic History   Marital status: Married    Spouse name: Not on file   Number of children: 2   Years of education: Not on file   Highest education level: Not on file  Occupational History   Occupation: retired  Tobacco Use   Smoking status: Former    Types: Cigarettes    Quit date: 02/18/1981    Years since quitting: 40.5   Smokeless tobacco: Never  Vaping Use   Vaping Use: Never used  Substance and Sexual Activity   Alcohol use: Yes     Alcohol/week: 2.0 standard drinks    Types: 2 Glasses of wine per week    Comment: 2 glasses 4 days/week   Drug use: No   Sexual activity: Yes  Other Topics Concern   Not on file  Social History Narrative   Not on file   Social Determinants of Health   Financial Resource Strain: Low Risk    Difficulty of Paying Living Expenses: Not hard at all  Food Insecurity: No Food Insecurity   Worried About Charity fundraiser in the Last Year: Never true   [place] in the Last Year: Never true  Transportation Needs: No Transportation Needs   Lack of Transportation (Medical): No   Lack of Transportation (Non-Medical): No  Physical Activity: Insufficiently Active   Days of Exercise per Week: 4 days   Minutes of Exercise per Session: 30 min  Stress: No Stress Concern Present   Feeling of Stress : Not at all  Social Connections: Socially Integrated   Frequency of Communication with Friends and Family: More than three times a week   Frequency of Social Gatherings with Friends and Family: More than three times a week   Attends Religious Services: More than 4 times per year   Active Member of [employer] or Organizations: Yes   Attends Archivist Meetings: 1 to 4 times per year   Marital Status: Married  Human resources officer Violence: Not At Risk   Fear of Current or Ex-Partner: No   Emotionally Abused: No   Physically Abused: No   Sexually Abused: No    Review of Systems  Constitutional: Negative.   HENT: Negative.    Eyes: Negative.   Respiratory: Negative.    Cardiovascular: Negative.   Gastrointestinal: Negative.   Genitourinary:  Positive for frequency and urgency. Negative for dysuria, flank pain and hematuria.  Musculoskeletal: Negative.   Skin: Negative.   Neurological: Negative.   Endo/Heme/Allergies: Negative.   Psychiatric/Behavioral: Negative.    All other systems reviewed and are negative.  Objective   Vitals as reported by the patient: There were no vitals  filed for this visit.  Elizabeth [name] was seen today for urinary tract infection.  Diagnoses and all orders for this visit:  Recurrent UTI -     sulfamethoxazole-trimethoprim (BACTRIM DS) 800-160 MG tablet; Take 1 tablet by mouth 2 (two) times daily.   PLAN Similar symptoms to previous. Will treat presumptively as above. Suggest skip azo if she plans to continue ibuprofen. Pt voices understanding of plan. She will see urology in 10 days - but if symptoms persist, can have lab visit for poct urinalysis and urine culture.  Reviewed reasons to seek care at Urgent Care or ER over weekend with patient, who voices understanding. Patient encouraged to call clinic with any questions, comments, or concerns.  I discussed the assessment and treatment plan with the patient. The patient was provided an opportunity to ask questions and all were answered. The patient agreed with the plan and demonstrated an understanding of the instructions.   The patient was advised to call back or seek an in-person evaluation if the symptoms worsen or if the condition fails to improve as anticipated.  I provided 12 minutes of non-face-to-face time during this encounter.  Maximiano Coss, NP

## 2021-09-12 DIAGNOSIS — L723 Sebaceous cyst: Secondary | ICD-10-CM | POA: Diagnosis not present

## 2021-09-12 DIAGNOSIS — N3000 Acute cystitis without hematuria: Secondary | ICD-10-CM | POA: Diagnosis not present

## 2021-09-12 DIAGNOSIS — N952 Postmenopausal atrophic vaginitis: Secondary | ICD-10-CM | POA: Diagnosis not present

## 2021-09-12 DIAGNOSIS — L089 Local infection of the skin and subcutaneous tissue, unspecified: Secondary | ICD-10-CM | POA: Diagnosis not present

## 2021-09-12 DIAGNOSIS — Z9889 Other specified postprocedural states: Secondary | ICD-10-CM | POA: Diagnosis not present

## 2021-09-29 DIAGNOSIS — Z01419 Encounter for gynecological examination (general) (routine) without abnormal findings: Secondary | ICD-10-CM | POA: Diagnosis not present

## 2021-09-29 DIAGNOSIS — N952 Postmenopausal atrophic vaginitis: Secondary | ICD-10-CM | POA: Diagnosis not present

## 2021-09-30 ENCOUNTER — Other Ambulatory Visit: Payer: Self-pay | Admitting: Family Medicine

## 2021-09-30 DIAGNOSIS — M25552 Pain in left hip: Secondary | ICD-10-CM

## 2021-10-24 DIAGNOSIS — N952 Postmenopausal atrophic vaginitis: Secondary | ICD-10-CM | POA: Diagnosis not present

## 2021-10-24 DIAGNOSIS — N3000 Acute cystitis without hematuria: Secondary | ICD-10-CM | POA: Diagnosis not present

## 2021-11-08 DIAGNOSIS — D0512 Intraductal carcinoma in situ of left breast: Secondary | ICD-10-CM | POA: Insufficient documentation

## 2021-11-10 DIAGNOSIS — H5213 Myopia, bilateral: Secondary | ICD-10-CM | POA: Diagnosis not present

## 2021-11-10 DIAGNOSIS — H04123 Dry eye syndrome of bilateral lacrimal glands: Secondary | ICD-10-CM | POA: Diagnosis not present

## 2021-11-10 DIAGNOSIS — H04203 Unspecified epiphora, bilateral lacrimal glands: Secondary | ICD-10-CM | POA: Diagnosis not present

## 2021-11-10 DIAGNOSIS — H524 Presbyopia: Secondary | ICD-10-CM | POA: Diagnosis not present

## 2021-11-10 DIAGNOSIS — H52223 Regular astigmatism, bilateral: Secondary | ICD-10-CM | POA: Diagnosis not present

## 2021-11-23 ENCOUNTER — Encounter: Payer: Self-pay | Admitting: Registered Nurse

## 2021-11-23 ENCOUNTER — Ambulatory Visit (INDEPENDENT_AMBULATORY_CARE_PROVIDER_SITE_OTHER): Payer: Medicare HMO | Admitting: Registered Nurse

## 2021-11-23 VITALS — BP 125/82 | HR 81 | Temp 98.1°F | Resp 18 | Ht 66.0 in | Wt 139.2 lb

## 2021-11-23 DIAGNOSIS — L089 Local infection of the skin and subcutaneous tissue, unspecified: Secondary | ICD-10-CM

## 2021-11-23 MED ORDER — DOXYCYCLINE HYCLATE 100 MG PO TABS: 100.0000 mg | ORAL_TABLET | Freq: Two times a day (BID) | ORAL | 0 refills | Status: DC

## 2021-11-23 MED ORDER — MUPIROCIN 2 % EX OINT: 1.0000 "application " | TOPICAL_OINTMENT | Freq: Two times a day (BID) | CUTANEOUS | 0 refills | Status: DC

## 2021-11-23 NOTE — Patient Instructions (Addendum)
Ms. Elizabeth [name] to meet you in person!  Doxycycline and mupirocin for treatment - twice daily each.  Keep open to air when possible. Breathable fabrics are preferred.  Call on Monday if worsening or failing to improve.  Thank you! Good luck with your hip if I don't speak to you before then!  Happy New Year,  Elizabeth [name]     If you have lab work done today you will be contacted with your lab results within the next 2 weeks.  If you have not heard from [place] then please contact us. The fastest way to get your results is to register for My Chart.   IF you received an x-ray today, you will receive an invoice from [facility]. Please contact [facility] at [phone] with questions or concerns regarding your invoice.   IF you received labwork today, you will receive an invoice from [facility]. Please contact [facility] at [phone] with questions or concerns regarding your invoice.   Our billing staff will not be able to assist you with questions regarding bills from these companies.  You will be contacted with the lab results as soon as they are available. The fastest way to get your results is to activate your My Chart account. Instructions are located on the last page of this paperwork. If you have not heard from [place] regarding the results in 2 weeks, please contact this office.

## 2021-11-23 NOTE — Progress Notes (Signed)
Established Patient Office Visit  Subjective:  Patient ID: Elizabeth [name], female    DOB: September 22, 1941  Age: 81 y.o. MRN: [id number]  CC:  Chief Complaint  Patient presents with   Recurrent Skin Infections    Patient states she has had a a boil on right side of hip since 11/14/2021. Patient patient states the location is tender touch.patient states she was at [facility] and that nurse told her to use warm compress on the area 3x daily    HPI Elizabeth [name] presents for skin infection  Appears as boil. R side of hip. Onset 11/14/21. Has been doing warm compresses 3 times daily since. Has not spread in diameter. Seems more focused and red around the middle.  No systemic symptoms. Joint pain baseline. No immune compromise at this time.   Past Medical History:  Diagnosis Date   Arthritis    Cancer ([facility])    pre cancer    Chicken pox    History of radiation therapy 06/26/17- 07/24/17   Left Breast 50.05 Gy total   Personal history of radiation therapy    Shingles 07-2002   Sleep apnea    mild uses oral devise   Urinary tract infection     Past Surgical History:  Procedure Laterality Date   BREAST BIOPSY Bilateral 03/07/2017   benign   BREAST BIOPSY Right 03/26/2017   benign   BREAST BIOPSY Left 03/26/2017   malignant   BREAST LUMPECTOMY Left 05/03/2017   BREAST LUMPECTOMY WITH RADIOACTIVE SEED LOCALIZATION Left 05/03/2017   Procedure: LEFT BREAST LUMPECTOMY WITH RADIOACTIVE SEED LOCALIZATION;  Surgeon: Griselda Miner, MD;  Location: [facility] OR;  Service: General;  Laterality: Left;   COLONOSCOPY     HYSTEROSCOPY     Dr Nicholas Lose   SPINAL FUSION  12/01/2016   T10 to Pelvis   UPPER GASTROINTESTINAL ENDOSCOPY  11/30/2020   WISDOM TOOTH EXTRACTION     age 48's    Family History  Problem Relation Age of Onset   Diabetes Mother    Arthritis Father    Cancer Maternal Aunt        liver   Heart disease Paternal Grandfather    Prostate cancer Brother    Colon  cancer Neg Hx    Pancreatic cancer Neg Hx    Stomach cancer Neg Hx     Social History   Socioeconomic History   Marital status: Married    Spouse name: Not on file   Number of children: 2   Years of education: Not on file   Highest education level: Not on file  Occupational History   Occupation: retired  Tobacco Use   Smoking status: Former    Types: Cigarettes    Quit date: 02/18/1981    Years since quitting: 40.7   Smokeless tobacco: Never  Vaping Use   Vaping Use: Never used  Substance and Sexual Activity   Alcohol use: Yes    Alcohol/week: 2.0 standard drinks    Types: 2 Glasses of wine per week    Comment: 2 glasses 4 days/week   Drug use: No   Sexual activity: Yes  Other Topics Concern   Not on file  Social History Narrative   Not on file   Social Determinants of Health   Financial Resource Strain: Low Risk    Difficulty of Paying Living Expenses: Not hard at all  Food Insecurity: No Food Insecurity   Worried About Programme researcher, broadcasting/film/video in the  Last Year: Never true   Ran Out of Food in the Last Year: Never true  Transportation Needs: No Transportation Needs   Lack of Transportation (Medical): No   Lack of Transportation (Non-Medical): No  Physical Activity: Insufficiently Active   Days of Exercise per Week: 4 days   Minutes of Exercise per Session: 30 min  Stress: No Stress Concern Present   Feeling of Stress : Not at all  Social Connections: Socially Integrated   Frequency of Communication with Friends and Family: More than three times a week   Frequency of Social Gatherings with Friends and Family: More than three times a week   Attends Religious Services: More than 4 times per year   Active Member of [employer] or Organizations: Yes   Attends Archivist Meetings: 1 to 4 times per year   Marital Status: Married  Human resources officer Violence: Not At Risk   Fear of Current or Ex-Partner: No   Emotionally Abused: No   Physically Abused: No   Sexually  Abused: No    Outpatient Medications Prior to Visit  Medication Sig Dispense Refill   Biotin 5000 MCG TABS Take 1 tablet by mouth daily.     cholecalciferol (VITAMIN D3) 25 MCG (1000 UNIT) tablet Vitamin D     gabapentin (NEURONTIN) 300 MG capsule TAKE 1 CAPSULE BY MOUTH FOUR TIMES A DAY 360 capsule 0   montelukast (SINGULAIR) 10 MG tablet TAKE 1 TABLET BY MOUTH EVERYDAY AT BEDTIME 90 tablet 1   Multiple Vitamins-Minerals (CENTRUM SILVER 50+WOMEN PO) Take by mouth.     omeprazole (PRILOSEC) 20 MG capsule Take 1 capsule (20 mg total) by mouth daily. 90 capsule 3   Probiotic Product (ADVANCED PROBIOTIC 10) CAPS Take 1 capsule by mouth daily.     celecoxib (CELEBREX) 100 MG capsule Take 100 mg by mouth 2 (two) times daily.     sulfamethoxazole-trimethoprim (BACTRIM DS) 800-160 MG tablet Take 1 tablet by mouth 2 (two) times daily. 10 tablet 0   No facility-administered medications prior to visit.    No Known Allergies  ROS Review of Systems  Constitutional: Negative.   HENT: Negative.    Eyes: Negative.   Respiratory: Negative.    Cardiovascular: Negative.   Gastrointestinal: Negative.   Endocrine: Negative.   Genitourinary: Negative.   Musculoskeletal: Negative.   Skin: Negative.        Skin infection lateral R hip  Allergic/Immunologic: Negative.   Neurological: Negative.   Hematological: Negative.   Psychiatric/Behavioral: Negative.    All other systems reviewed and are negative.    Objective:    Physical Exam Vitals and nursing note reviewed.  Constitutional:      General: She is not in acute distress.    Appearance: Normal appearance. She is normal weight. She is not ill-appearing, toxic-appearing or diaphoretic.  Cardiovascular:     Rate and Rhythm: Normal rate and regular rhythm.     Heart sounds: Normal heart sounds. No murmur heard.   No friction rub. No gallop.  Pulmonary:     Effort: Pulmonary effort is normal. No respiratory distress.     Breath sounds:  Normal breath sounds. No stridor. No wheezing, rhonchi or rales.  Chest:     Chest wall: No tenderness.  Skin:    General: Skin is warm and dry.     Findings: Lesion (cellulitis R hip. question of developing abscess but no distinct drainable lesion.) present.  Neurological:     General: No focal deficit present.  Mental Status: She is alert and oriented to person, place, and time. Mental status is at baseline.  Psychiatric:        Mood and Affect: Mood normal.        Behavior: Behavior normal.        Thought Content: Thought content normal.        Judgment: Judgment normal.    BP 125/82    Pulse 81    Temp 98.1 F (36.7 C) (Temporal)    Resp 18    Ht $R'5\' 6"'oU$  (1.676 m)    Wt 139 lb 3.2 oz (63.1 kg)    SpO2 99%    BMI 22.47 kg/m  Wt Readings from Last 3 Encounters:  11/23/21 139 lb 3.2 oz (63.1 kg)  08/12/21 137 lb 9.6 oz (62.4 kg)  07/06/21 138 lb 12.8 oz (63 kg)     There are no preventive care reminders to display for this patient.  There are no preventive care reminders to display for this patient.  Lab Results  Component Value Date   TSH 2.61 08/12/2021   Lab Results  Component Value Date   WBC 9.8 08/12/2021   HGB 12.6 08/12/2021   HCT 37.4 08/12/2021   MCV 98.3 08/12/2021   PLT 326.0 08/12/2021   Lab Results  Component Value Date   NA 132 (L) 08/12/2021   K 4.9 08/12/2021   CHLORIDE 102 04/04/2017   CO2 28 08/12/2021   GLUCOSE 95 08/12/2021   BUN 17 08/12/2021   CREATININE 0.75 08/12/2021   BILITOT 0.7 08/12/2021   ALKPHOS 56 08/12/2021   AST 19 08/12/2021   ALT 21 08/12/2021   PROT 7.6 08/12/2021   ALBUMIN 4.9 08/12/2021   CALCIUM 10.4 08/12/2021   ANIONGAP 7 04/30/2017   EGFR 74 (L) 04/04/2017   GFR 76.13 08/12/2021   Lab Results  Component Value Date   CHOL 220 (H) 08/12/2021   Lab Results  Component Value Date   HDL 106.80 08/12/2021   Lab Results  Component Value Date   LDLCALC 90 08/12/2021   Lab Results  Component Value Date    TRIG 116.0 08/12/2021   Lab Results  Component Value Date   CHOLHDL 2 08/12/2021   Lab Results  Component Value Date   HGBA1C 4.1 10/09/2013      Assessment & Plan:   Problem List Items Addressed This Visit   None Visit Diagnoses     Skin infection    -  Primary   Relevant Medications   doxycycline (VIBRA-TABS) 100 MG tablet   mupirocin ointment (BACTROBAN) 2 %       Meds ordered this encounter  Medications   doxycycline (VIBRA-TABS) 100 MG tablet    Sig: Take 1 tablet (100 mg total) by mouth 2 (two) times daily.    Dispense:  20 tablet    Refill:  0    Order Specific Question:   Supervising Provider    Answer:   Carlota Raspberry, JEFFREY R [[id number]]   mupirocin ointment (BACTROBAN) 2 %    Sig: Apply 1 application topically 2 (two) times daily.    Dispense:  22 g    Refill:  0    Order Specific Question:   Supervising Provider    Answer:   Carlota Raspberry, JEFFREY R [[id number]]    Follow-up: Return if symptoms worsen or fail to improve.   PLAN Doxycycline and bactirm No drainable lesion at this time Low threshold for follow up  Patient encouraged to call clinic  with any questions, comments, or concerns.  Maximiano Coss, NP

## 2021-11-27 ENCOUNTER — Encounter: Payer: Self-pay | Admitting: Registered Nurse

## 2021-11-28 NOTE — Telephone Encounter (Signed)
Patient states she still have a skin infection and it has possible gotten worse. Pt was told to call today if nothing was better she is planning to have surgery 12/21/2021 and want to make sure she does not have to cancel.

## 2021-11-28 NOTE — Telephone Encounter (Signed)
Pt is concerned because she is scheduled for a hip replacement on 12/21/21. She doesn't want her surgery to be canceled

## 2021-11-28 NOTE — Telephone Encounter (Signed)
Pt sent update on possible cyst? Anything additional for treatment plan at this time?

## 2021-12-01 NOTE — Telephone Encounter (Signed)
Pt is asking for final steps of care for her lesion?

## 2021-12-07 ENCOUNTER — Other Ambulatory Visit: Payer: Self-pay | Admitting: Registered Nurse

## 2021-12-07 DIAGNOSIS — L089 Local infection of the skin and subcutaneous tissue, unspecified: Secondary | ICD-10-CM

## 2021-12-07 MED ORDER — SULFAMETHOXAZOLE-TRIMETHOPRIM 800-160 MG PO TABS
1.0000 | ORAL_TABLET | Freq: Two times a day (BID) | ORAL | 0 refills | Status: DC
Start: 2021-12-07 — End: 2021-12-22

## 2021-12-07 NOTE — Telephone Encounter (Signed)
Pt sent message to follow up again reports still inflamed and is tender please advise

## 2021-12-07 NOTE — Progress Notes (Addendum)
COVID swab appointment: 12/19/21 @ 0915  COVID Vaccine Completed: yes x4 Date COVID Vaccine completed: 12/04/19, 12/25/19 Has received booster: 04/11/21, 11/03/21 COVID vaccine manufacturer: [id number]      Date of COVID positive in last 90 days: no  PCP - Annye Asa, MD Cardiologist - [place] clearance by Annye Asa 09/15/21 on chart  Chest x-ray - n/a EKG - n/a Stress Test - n/a ECHO - n/a Cardiac Cath - n/a Pacemaker/ICD device last checked: n/a Spinal Cord Stimulator: n/a  Sleep Study - yes, positive CPAP - no, was using mouth piece but not currently using  Fasting Blood Sugar - n/a Checks Blood Sugar _____ times a day  Blood Thinner Instructions: n/a Aspirin Instructions: Last Dose:  Activity level: Can go up a flight of stairs and perform activities of daily living without stopping and without symptoms of chest pain or shortness of breath.    Anesthesia review: breast cancer, dysphagia, cyst to right hip no current oozing, Na+ 129 at PAT  Patient denies shortness of breath, fever, cough and chest pain at PAT appointment   Patient verbalized understanding of instructions that were given to them at the PAT appointment. Patient was also instructed that they will need to review over the PAT instructions again at home before surgery.

## 2021-12-08 ENCOUNTER — Encounter (HOSPITAL_COMMUNITY)
Admission: RE | Admit: 2021-12-08 | Discharge: 2021-12-08 | Disposition: A | Discharge status: Home / Self Care | Payer: Medicare HMO | Source: Ambulatory Visit | Attending: Orthopedic Surgery | Admitting: Orthopedic Surgery

## 2021-12-08 ENCOUNTER — Other Ambulatory Visit: Payer: Self-pay

## 2021-12-08 ENCOUNTER — Encounter (HOSPITAL_COMMUNITY): Payer: Self-pay

## 2021-12-08 VITALS — HR 82 | Temp 98.4°F | Resp 16 | Ht 66.5 in | Wt 137.4 lb

## 2021-12-08 DIAGNOSIS — M1611 Unilateral primary osteoarthritis, right hip: Secondary | ICD-10-CM | POA: Diagnosis not present

## 2021-12-08 DIAGNOSIS — Z01818 Encounter for other preprocedural examination: Secondary | ICD-10-CM

## 2021-12-08 DIAGNOSIS — Z01812 Encounter for preprocedural laboratory examination: Secondary | ICD-10-CM | POA: Insufficient documentation

## 2021-12-08 HISTORY — DX: Gastro-esophageal reflux disease without esophagitis: K21.9

## 2021-12-08 LAB — COMPREHENSIVE METABOLIC PANEL
ALT: 22 U/L (ref 0–44)
AST: 24 U/L (ref 15–41)
Albumin: 4.2 g/dL (ref 3.5–5.0)
Alkaline Phosphatase: 56 U/L (ref 38–126)
Anion gap: 8 (ref 5–15)
BUN: 10 mg/dL (ref 8–23)
CO2: 24 mmol/L (ref 22–32)
Calcium: 9.6 mg/dL (ref 8.9–10.3)
Chloride: 97 mmol/L — ABNORMAL LOW (ref 98–111)
Creatinine, Ser: 0.99 mg/dL (ref 0.44–1.00)
GFR, Estimated: 58 mL/min — ABNORMAL LOW (ref >60)
Glucose, Bld: 98 mg/dL (ref 70–99)
Potassium: 4.4 mmol/L (ref 3.5–5.1)
Sodium: 129 mmol/L — ABNORMAL LOW (ref 135–145)
Total Bilirubin: 0.6 mg/dL (ref 0.3–1.2)
Total Protein: 7.1 g/dL (ref 6.5–8.1)

## 2021-12-08 LAB — CBC
HCT: 33.9 % — ABNORMAL LOW (ref 36.0–46.0)
Hemoglobin: 11.6 g/dL — ABNORMAL LOW (ref 12.0–15.0)
MCH: 33.5 pg (ref 26.0–34.0)
MCHC: 34.2 g/dL (ref 30.0–36.0)
MCV: 98 fL (ref 80.0–100.0)
Platelets: 257 10*3/uL (ref 150–400)
RBC: 3.46 MIL/uL — ABNORMAL LOW (ref 3.87–5.11)
RDW: 11.7 % (ref 11.5–15.5)
WBC: 4.7 10*3/uL (ref 4.0–10.5)
nRBC: 0 % (ref 0.0–0.2)

## 2021-12-08 LAB — TYPE AND SCREEN
ABO/RH(D): O POS
Antibody Screen: NEGATIVE

## 2021-12-08 LAB — SURGICAL PCR SCREEN
MRSA, PCR: NEGATIVE
Staphylococcus aureus: NEGATIVE

## 2021-12-08 NOTE — Progress Notes (Signed)
Sodium 129 at PAT. Results routed to Dr. Wynelle Link

## 2021-12-08 NOTE — Patient Instructions (Addendum)
DUE TO COVID-19 ONLY ONE VISITOR IS ALLOWED TO COME WITH YOU AND STAY IN THE WAITING ROOM ONLY DURING PRE OP AND PROCEDURE.   **NO VISITORS ARE ALLOWED IN THE SHORT STAY AREA OR RECOVERY ROOM!!**  IF YOU WILL BE ADMITTED INTO THE HOSPITAL YOU ARE ALLOWED ONLY TWO SUPPORT PEOPLE DURING VISITATION HOURS ONLY (7 AM -8PM)   The support person(s) must pass our screening, gel in and out, and wear a mask at all times, including in the patients room. Patients must also wear a mask when staff or their support person are in the room. Visitors GUEST BADGE MUST BE WORN VISIBLY  One adult visitor may remain with you overnight and MUST be in the room by 8 P.M.  No visitors under the age of 38. Any visitor under the age of 51 must be accompanied by an adult.    COVID SWAB TESTING MUST BE COMPLETED ON:  12/19/21 @ 9:15 AM   Site: [facility] [place] Enter: Main Entrance have a seat in the waiting area to the right of main entrance (DO NOT [place]!!!!!) Dial: [phone] to alert staff you have arrived  You are not required to quarantine, however you are required to wear a well-fitted mask when you are out and around people not in your household.  Hand Hygiene often Do NOT share personal items Notify your provider if you are in close contact with someone who has COVID or you develop fever 100.4 or greater, new onset of sneezing, cough, sore throat, shortness of breath or body aches.       Your procedure is scheduled on: 12/21/21   Report to [facility] Main Entrance    Report to admitting at 5:45 AM   Call this number if you have problems the morning of surgery [phone]   Do not eat food :After Midnight.   May have liquids until 5:30 AM day of surgery  CLEAR LIQUID DIET  Foods Allowed                                                                     Foods Excluded  Water, Black Coffee and tea (no milk or creamer)            liquids  that you cannot  Plain Jell-O in any flavor  (No red)                                    see through such as: Fruit ices (not with fruit pulp)                                            milk, soups, orange juice              Iced Popsicles (No red)  All solid food                                   Apple juices Sports drinks like Gatorade (No red) Lightly seasoned clear broth or consume(fat free) Sugar     The day of surgery:  Drink ONE (1) Pre-Surgery Clear Ensure at 5:30 AM the morning of surgery. Drink in one sitting. Do not sip.  This drink was given to you during your hospital  pre-op appointment visit. Nothing else to drink after completing the  Pre-Surgery Clear Ensure.          If you have questions, please contact your surgeons office.     Oral Hygiene is also important to reduce your risk of infection.                                    Remember - BRUSH YOUR TEETH THE MORNING OF SURGERY WITH YOUR REGULAR TOOTHPASTE   Stop all vitamins, supplements, and NSAIDS (including ibuprofen) 7 days before surgery.   Take these medicines the morning of surgery with A SIP OF WATER: Tylenol, Gabapentin, Omeprazole, Bactrim                              You may not have any metal on your body including hair pins, jewelry, and body piercing             Do not wear make-up, lotions, powders, perfumes, or deodorant  Do not wear nail polish including gel and S&S, artificial/acrylic nails, or any other type of covering on natural nails including finger and toenails. If you have artificial nails, gel coating, etc. that needs to be removed by a nail salon please have this removed prior to surgery or surgery may need to be canceled/ delayed if the surgeon/ anesthesia feels like they are unable to be safely monitored.   Do not shave  48 hours prior to surgery.    Do not bring valuables to the hospital. [place].   Contacts, dentures or bridgework may not be worn into surgery.   Bring small overnight bag day of surgery.   Special Instructions: Bring a copy of your healthcare power of attorney and living will documents         the day of surgery if you haven't scanned them before.              Please read over the following fact sheets you were given: IF YOU HAVE QUESTIONS ABOUT YOUR PRE-OP INSTRUCTIONS PLEASE CALL [facility] - Preparing for Surgery Before surgery, you can play an important role.  Because skin is not sterile, your skin needs to be as free of germs as possible.  You can reduce the number of germs on your skin by washing with CHG (chlorahexidine gluconate) soap before surgery.  CHG is an antiseptic cleaner which kills germs and bonds with the skin to continue killing germs even after washing. Please DO NOT use if you have an allergy to CHG or antibacterial soaps.  If your skin becomes reddened/irritated stop using the CHG and inform your nurse when you arrive at Short Stay.  Do not shave (including legs and underarms) for at least 48 hours prior to the first CHG shower.  You may shave your face/neck.  Please follow these instructions carefully:  1.  Shower with CHG Soap the night before surgery and the  morning of surgery.  2.  If you choose to wash your hair, wash your hair first as usual with your normal  shampoo.  3.  After you shampoo, rinse your hair and body thoroughly to remove the shampoo.                             4.  Use CHG as you would any other liquid soap.  You can apply chg directly to the skin and wash.  Gently with a scrungie or clean washcloth.  5.  Apply the CHG Soap to your body ONLY FROM THE NECK DOWN.   Do   not use on face/ open                           Wound or open sores. Avoid contact with eyes, ears mouth and   genitals (private parts).                       Wash face,  Genitals (private parts) with your normal soap.              6.  Wash thoroughly, paying special attention to the area where your    surgery  will be performed.  7.  Thoroughly rinse your body with warm water from the neck down.  8.  DO NOT shower/wash with your normal soap after using and rinsing off the CHG Soap.                9.  Pat yourself dry with a clean towel.            10.  Wear clean pajamas.            11.  Place clean sheets on your bed the night of your first shower and do not  sleep with pets. Day of Surgery : Do not apply any lotions/deodorants the morning of surgery.  Please wear clean clothes to the hospital/surgery center.  FAILURE TO FOLLOW THESE INSTRUCTIONS MAY RESULT IN THE CANCELLATION OF YOUR SURGERY  PATIENT SIGNATURE_________________________________  NURSE SIGNATURE__________________________________  ________________________________________________________________________   Adam Phenix  An incentive spirometer is a tool that can help keep your lungs clear and active. This tool measures how well you are filling your lungs with each breath. Taking long deep breaths may help reverse or decrease the chance of developing breathing (pulmonary) problems (especially infection) following: A long period of time when you are unable to move or be active. BEFORE THE PROCEDURE  If the spirometer includes an indicator to show your best effort, your nurse or respiratory therapist will set it to a desired goal. If possible, sit up straight or lean slightly forward. Try not to slouch. Hold the incentive spirometer in an upright position. INSTRUCTIONS FOR USE  Sit on the edge of your bed if possible, or sit up as far as you can in bed or on a chair. Hold the incentive spirometer in an upright position. Breathe out normally. Place the mouthpiece in your mouth and seal your lips tightly around it. Breathe in slowly and as deeply as possible, raising the piston or the  ball toward the top of the column. Hold your breath for  3-5 seconds or for as long as possible. Allow the piston or ball to fall to the bottom of the column. Remove the mouthpiece from your mouth and breathe out normally. Rest for a few seconds and repeat Steps 1 through 7 at least 10 times every 1-2 hours when you are awake. Take your time and take a few normal breaths between deep breaths. The spirometer may include an indicator to show your best effort. Use the indicator as a goal to work toward during each repetition. After each set of 10 deep breaths, practice coughing to be sure your lungs are clear. If you have an incision (the cut made at the time of surgery), support your incision when coughing by placing a pillow or rolled up towels firmly against it. Once you are able to get out of bed, walk around indoors and cough well. You may stop using the incentive spirometer when instructed by your caregiver.  RISKS AND COMPLICATIONS Take your time so you do not get dizzy or light-headed. If you are in pain, you may need to take or ask for pain medication before doing incentive spirometry. It is harder to take a deep breath if you are having pain. AFTER USE Rest and breathe slowly and easily. It can be helpful to keep track of a log of your progress. Your caregiver can provide you with a simple table to help with this. If you are using the spirometer at home, follow these instructions: [place] IF:  You are having difficultly using the spirometer. You have trouble using the spirometer as often as instructed. Your pain medication is not giving enough relief while using the spirometer. You develop fever of 100.5 F (38.1 C) or higher. SEEK IMMEDIATE MEDICAL CARE IF:  You cough up bloody sputum that had not been present before. You develop fever of 102 F (38.9 C) or greater. You develop worsening pain at or near the incision site. MAKE SURE YOU:  Understand these instructions. Will watch your condition. Will get help right away if you  are not doing well or get worse. Document Released: 03/19/2007 Document Revised: 01/29/2012 Document Reviewed: 05/20/2007 ExitCare Patient Information 2014 ExitCare, [place].   ________________________________________________________________________  WHAT IS A BLOOD TRANSFUSION? Blood Transfusion Information  A transfusion is the replacement of blood or some of its parts. Blood is made up of multiple cells which provide different functions. Red blood cells carry oxygen and are used for blood loss replacement. White blood cells fight against infection. Platelets control bleeding. Plasma helps clot blood. Other blood products are available for specialized needs, such as hemophilia or other clotting disorders. BEFORE THE TRANSFUSION  Who gives blood for transfusions?  Healthy volunteers who are fully evaluated to make sure their blood is safe. This is blood bank blood. Transfusion therapy is the safest it has ever been in the practice of medicine. Before blood is taken from a donor, a complete history is taken to make sure that person has no history of diseases nor engages in risky social behavior (examples are intravenous drug use or sexual activity with multiple partners). The donor's travel history is screened to minimize risk of transmitting infections, such as malaria. The donated blood is tested for signs of infectious diseases, such as HIV and hepatitis. The blood is then tested to be sure it is compatible with you in order to minimize the chance of a transfusion reaction. If you or a  relative donates blood, this is often done in anticipation of surgery and is not appropriate for emergency situations. It takes many days to process the donated blood. RISKS AND COMPLICATIONS Although transfusion therapy is very safe and saves many lives, the main dangers of transfusion include:  Getting an infectious disease. Developing a transfusion reaction. This is an allergic reaction to something in the  blood you were given. Every precaution is taken to prevent this. The decision to have a blood transfusion has been considered carefully by your caregiver before blood is given. Blood is not given unless the benefits outweigh the risks. AFTER THE TRANSFUSION Right after receiving a blood transfusion, you will usually feel much better and more energetic. This is especially true if your red blood cells have gotten low (anemic). The transfusion raises the level of the red blood cells which carry oxygen, and this usually causes an energy increase. The nurse administering the transfusion will monitor you carefully for complications. HOME CARE INSTRUCTIONS  No special instructions are needed after a transfusion. You may find your energy is better. Speak with your caregiver about any limitations on activity for underlying diseases you may have. SEEK MEDICAL CARE IF:  Your condition is not improving after your transfusion. You develop redness or irritation at the intravenous (IV) site. SEEK IMMEDIATE MEDICAL CARE IF:  Any of the following symptoms occur over the next 12 hours: Shaking chills. You have a temperature by mouth above 102 F (38.9 C), not controlled by medicine. Chest, back, or muscle pain. People around you feel you are not acting correctly or are confused. Shortness of breath or difficulty breathing. Dizziness and fainting. You get a rash or develop hives. You have a decrease in urine output. Your urine turns a dark color or changes to pink, red, or brown. Any of the following symptoms occur over the next 10 days: You have a temperature by mouth above 102 F (38.9 C), not controlled by medicine. Shortness of breath. Weakness after normal activity. The white part of the eye turns yellow (jaundice). You have a decrease in the amount of urine or are urinating less often. Your urine turns a dark color or changes to pink, red, or brown. Document Released: 11/03/2000 Document Revised:  01/29/2012 Document Reviewed: 06/22/2008 [facility] Patient Information 2014 [place], [place].  _______________________________________________________________________

## 2021-12-09 ENCOUNTER — Ambulatory Visit (INDEPENDENT_AMBULATORY_CARE_PROVIDER_SITE_OTHER): Payer: Medicare HMO | Admitting: Registered Nurse

## 2021-12-09 ENCOUNTER — Encounter: Payer: Self-pay | Admitting: Registered Nurse

## 2021-12-09 VITALS — BP 120/78 | HR 67 | Temp 98.2°F | Ht 66.0 in | Wt 140.0 lb

## 2021-12-09 DIAGNOSIS — L0291 Cutaneous abscess, unspecified: Secondary | ICD-10-CM | POA: Diagnosis not present

## 2021-12-09 NOTE — Patient Instructions (Signed)
Ms. Rames -   Doristine Devoid to see you and a happy early birthday.  Ok to continue mupirocin ointment twice daily  No need for warm compresses.  If worsening let me know  Otherwise, good luck with the hip!  Thanks,  Denice Paradise

## 2021-12-09 NOTE — Progress Notes (Signed)
Established Patient Office Visit  Subjective:  Patient ID: Elizabeth [name], female    DOB: 1941/01/22  Age: 81 y.o. MRN: [id number]  CC:  Chief Complaint  Patient presents with   Acute Visit    Spot on rt hip    HPI Elizabeth [name] [facility] presents for follow up   Abscess on R hip - lateral Had been seen by me on 11/23/21 Much improved after abx Had preop visit for left hip arthroplasty - this will not prevent surgery  Lesion now appears as isolated drainable abscess.  No worsening symptoms, no systemic symptoms.   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    Chicken pox    GERD (gastroesophageal reflux disease)    History of radiation therapy 06/26/17- 07/24/17   Left Breast 50.05 Gy total   Personal history of radiation therapy    Shingles 07/2002   Sleep apnea    mild uses oral devise   Urinary tract infection     Past Surgical History:  Procedure Laterality Date   BREAST BIOPSY Bilateral 03/07/2017   benign   BREAST BIOPSY Right 03/26/2017   benign   BREAST BIOPSY Left 03/26/2017   malignant   BREAST LUMPECTOMY Left 05/03/2017   BREAST LUMPECTOMY WITH RADIOACTIVE SEED LOCALIZATION Left 05/03/2017   Procedure: LEFT BREAST LUMPECTOMY WITH RADIOACTIVE SEED LOCALIZATION;  Surgeon: Jovita Kussmaul, MD;  Location: [facility] OR;  Service: General;  Laterality: Left;   COLONOSCOPY     HYSTEROSCOPY     Dr Ubaldo Glassing   SPINAL FUSION  12/01/2016   T10 to Pelvis   UPPER GASTROINTESTINAL ENDOSCOPY  11/30/2020   WISDOM TOOTH EXTRACTION     age 17's    Family History  Problem Relation Age of Onset   Diabetes Mother    Arthritis Father    Cancer Maternal Aunt        liver   Heart disease Paternal Grandfather    Prostate cancer Brother    Colon cancer Neg Hx    Pancreatic cancer Neg Hx    Stomach cancer Neg Hx     Social History   Socioeconomic History   Marital status: Married    Spouse name: Not on file   Number of  children: 2   Years of education: Not on file   Highest education level: Not on file  Occupational History   Occupation: retired  Tobacco Use   Smoking status: Former    Types: Cigarettes    Quit date: 02/18/1981    Years since quitting: 40.8   Smokeless tobacco: Never  Vaping Use   Vaping Use: Never used  Substance and Sexual Activity   Alcohol use: Yes    Alcohol/week: 2.0 standard drinks    Types: 2 Glasses of wine per week    Comment: 2 glasses 4 days/week   Drug use: No   Sexual activity: Yes  Other Topics Concern   Not on file  Social History Narrative   Not on file   Social Determinants of Health   Financial Resource Strain: Low Risk    Difficulty of Paying Living Expenses: Not hard at all  Food Insecurity: No Food Insecurity   Worried About Charity fundraiser in the Last Year: Never true   [place] in the Last Year: Never true  Transportation Needs: No Transportation Needs   Lack of Transportation (Medical): No  Lack of Transportation (Non-Medical): No  Physical Activity: Insufficiently Active   Days of Exercise per Week: 4 days   Minutes of Exercise per Session: 30 min  Stress: No Stress Concern Present   Feeling of Stress : Not at all  Social Connections: Socially Integrated   Frequency of Communication with Friends and Family: More than three times a week   Frequency of Social Gatherings with Friends and Family: More than three times a week   Attends Religious Services: More than 4 times per year   Active Member of [employer] or Organizations: Yes   Attends Archivist Meetings: 1 to 4 times per year   Marital Status: Married  Human resources officer Violence: Not At Risk   Fear of Current or Ex-Partner: No   Emotionally Abused: No   Physically Abused: No   Sexually Abused: No    Outpatient Medications Prior to Visit  Medication Sig Dispense Refill   acetaminophen (TYLENOL) 500 MG tablet Take 1,000 mg by mouth in the morning and at bedtime.      Biotin 5000 MCG TABS Take 5,000 mcg by mouth daily.     cholecalciferol (VITAMIN D3) 25 MCG (1000 UNIT) tablet Take 1,000 Units by mouth daily.     CRANBERRY PO Take 2 capsules by mouth in the morning, at noon, and at bedtime.     estradiol (ESTRACE) 0.1 MG/GM vaginal cream Place vaginally 3 (three) times a week.     gabapentin (NEURONTIN) 300 MG capsule TAKE 1 CAPSULE BY MOUTH FOUR TIMES A DAY 360 capsule 0   ibuprofen (ADVIL) 200 MG tablet Take 400 mg by mouth in the morning and at bedtime.     Methylcellulose, Laxative, (CITRUCEL) 500 MG TABS Take 2,000 mg by mouth daily.     montelukast (SINGULAIR) 10 MG tablet Take 1 tablet by mouth at bedtime.     Multiple Vitamins-Minerals (CENTRUM SILVER 50+WOMEN PO) Take 1 tablet by mouth daily.     mupirocin ointment (BACTROBAN) 2 % Apply 1 application topically 2 (two) times daily. 22 g 0   omeprazole (PRILOSEC) 20 MG capsule Take 1 capsule (20 mg total) by mouth daily. 90 capsule 3   Probiotic Product (ADVANCED PROBIOTIC 10) CAPS Take 1 capsule by mouth daily.     sulfamethoxazole-trimethoprim (BACTRIM DS) 800-160 MG tablet Take 1 tablet by mouth 2 (two) times daily. 14 tablet 0   sulfamethoxazole-trimethoprim (BACTRIM DS) 800-160 MG tablet sulfamethoxazole 800 mg-trimethoprim 160 mg tablet     No facility-administered medications prior to visit.    No Known Allergies  ROS Review of Systems  Constitutional: Negative.   HENT: Negative.    Eyes: Negative.   Respiratory: Negative.    Cardiovascular: Negative.   Gastrointestinal: Negative.   Genitourinary: Negative.   Musculoskeletal: Negative.   Skin: Negative.   Neurological: Negative.   Psychiatric/Behavioral: Negative.    All other systems reviewed and are negative.    Objective:    Physical Exam Vitals and nursing note reviewed.  Constitutional:      General: She is not in acute distress.    Appearance: Normal appearance. She is normal weight. She is not ill-appearing,  toxic-appearing or diaphoretic.  Cardiovascular:     Rate and Rhythm: Normal rate and regular rhythm.     Heart sounds: Normal heart sounds. No murmur heard.   No friction rub. No gallop.  Pulmonary:     Effort: Pulmonary effort is normal. No respiratory distress.     Breath sounds: Normal breath  sounds. No stridor. No wheezing, rhonchi or rales.  Chest:     Chest wall: No tenderness.  Skin:    General: Skin is warm and dry.  Neurological:     General: No focal deficit present.     Mental Status: She is alert and oriented to person, place, and time. Mental status is at baseline.  Psychiatric:        Mood and Affect: Mood normal.        Behavior: Behavior normal.        Thought Content: Thought content normal.        Judgment: Judgment normal.   Procedure Pt advised on risks, benefits, alternatives to plan Informed consent signed, to be faxed into chart. Plan for I&D of abscess of R hip.  Sterilized area with iodine and alcohol Numbed with 1cc of lidocaine and epi subcutaneous. 23mm incision with #11 scalpel With pressure, produced moderate sanguinous and purulent drainage Pt tolerated procedure well Covered with sterile gauze.  Intent to heal with secondary intention Discussed return precautions Pt voices understanding.  BP 120/78 (BP Location: Left Arm, Patient Position: Sitting, Cuff Size: Normal)    Pulse 67    Temp 98.2 F (36.8 C) (Oral)    Ht $R'5\' 6"'Ox$  (1.676 m)    Wt 140 lb (63.5 kg)    SpO2 99%    BMI 22.60 kg/m  Wt Readings from Last 3 Encounters:  12/09/21 140 lb (63.5 kg)  12/08/21 137 lb 6.4 oz (62.3 kg)  11/23/21 139 lb 3.2 oz (63.1 kg)     There are no preventive care reminders to display for this patient.  There are no preventive care reminders to display for this patient.  Lab Results  Component Value Date   TSH 2.61 08/12/2021   Lab Results  Component Value Date   WBC 4.7 12/08/2021   HGB 11.6 (L) 12/08/2021   HCT 33.9 (L) 12/08/2021   MCV 98.0  12/08/2021   PLT 257 12/08/2021   Lab Results  Component Value Date   NA 129 (L) 12/08/2021   K 4.4 12/08/2021   CHLORIDE 102 04/04/2017   CO2 24 12/08/2021   GLUCOSE 98 12/08/2021   BUN 10 12/08/2021   CREATININE 0.99 12/08/2021   BILITOT 0.6 12/08/2021   ALKPHOS 56 12/08/2021   AST 24 12/08/2021   ALT 22 12/08/2021   PROT 7.1 12/08/2021   ALBUMIN 4.2 12/08/2021   CALCIUM 9.6 12/08/2021   ANIONGAP 8 12/08/2021   EGFR 74 (L) 04/04/2017   GFR 76.13 08/12/2021   Lab Results  Component Value Date   CHOL 220 (H) 08/12/2021   Lab Results  Component Value Date   HDL 106.80 08/12/2021   Lab Results  Component Value Date   LDLCALC 90 08/12/2021   Lab Results  Component Value Date   TRIG 116.0 08/12/2021   Lab Results  Component Value Date   CHOLHDL 2 08/12/2021   Lab Results  Component Value Date   HGBA1C 4.1 10/09/2013      Assessment & Plan:   Problem List Items Addressed This Visit   None Visit Diagnoses     Abscess    -  Primary       No orders of the defined types were placed in this encounter.   Follow-up: Return if symptoms worsen or fail to improve.   PLAN Reviewd return precautions Pt tolerated well No need for PO abx, will continue mupirocin bid Patient encouraged to call clinic with any  call clinic with any questions, comments, or concerns.  Maximiano Coss, NP

## 2021-12-15 NOTE — H&P (Signed)
TOTAL HIP ADMISSION H&P  Patient is admitted for left total hip arthroplasty.  Subjective:  Chief Complaint: Left hip pain  HPI: [name], 81 y.o. female, has a history of pain and functional disability in the left hip due to arthritis and patient has failed non-surgical conservative treatments for greater than 12 weeks to include NSAID's and/or analgesics, flexibility and strengthening excercises, and activity modification. Onset of symptoms was gradual, starting  several  years ago with gradually worsening course since that time. The patient noted no past surgery on the left hip. Patient currently rates pain in the left hip at 7 out of 10 with activity. Patient has worsening of pain with activity and weight bearing, pain that interfers with activities of daily living, and pain with passive range of motion. Patient has evidence of periarticular osteophytes and joint space narrowing by imaging studies. This condition presents safety issues increasing the risk of falls.  There is no current active infection.  Patient Active Problem List   Diagnosis Date Noted   Hx of breast cancer 08/12/2021   Pain of left hip joint 01/24/2021   Dysphagia 11/02/2020   Diarrhea 11/02/2020   Sensorineural hearing loss (SNHL) of both ears 06/24/2019   Apnea, sleep 11/03/2016   Lumbar back pain 12/23/2015   Routine general medical examination at a health care facility 10/08/2013   Chronic back pain 12/26/2012   Blood glucose elevated 12/26/2012    Past Medical History:  Diagnosis Date   Arthritis    Cancer ([place])    pre cancer    Chicken pox    GERD (gastroesophageal reflux disease)    History of radiation therapy 06/26/17- 07/24/17   Left Breast 50.05 Gy total   Personal history of radiation therapy    Shingles 07/2002   Sleep apnea    mild uses oral devise   Urinary tract infection     Past Surgical History:  Procedure Laterality Date   BREAST BIOPSY Bilateral 03/07/2017   benign    BREAST BIOPSY Right 03/26/2017   benign   BREAST BIOPSY Left 03/26/2017   malignant   BREAST LUMPECTOMY Left 05/03/2017   BREAST LUMPECTOMY WITH RADIOACTIVE SEED LOCALIZATION Left 05/03/2017   Procedure: LEFT BREAST LUMPECTOMY WITH RADIOACTIVE SEED LOCALIZATION;  Surgeon: Jovita Kussmaul, MD;  Location: [place];  Service: General;  Laterality: Left;   COLONOSCOPY     HYSTEROSCOPY     Dr Ubaldo Glassing   SPINAL FUSION  12/01/2016   T10 to Pelvis   UPPER GASTROINTESTINAL ENDOSCOPY  11/30/2020   WISDOM TOOTH EXTRACTION     age 57's    Prior to Admission medications   Medication Sig Start Date End Date Taking? Authorizing Provider  acetaminophen (TYLENOL) 500 MG tablet Take 1,000 mg by mouth in the morning and at bedtime.   Yes [provider]  Biotin 5000 MCG TABS Take 5,000 mcg by mouth daily.   Yes [provider]  cholecalciferol (VITAMIN D3) 25 MCG (1000 UNIT) tablet Take 1,000 Units by mouth daily.   Yes [provider]  CRANBERRY PO Take 2 capsules by mouth in the morning, at noon, and at bedtime.   Yes [provider]  estradiol (ESTRACE) 0.1 MG/GM vaginal cream Place vaginally 3 (three) times a week.   Yes [provider]  gabapentin (NEURONTIN) 300 MG capsule TAKE 1 CAPSULE BY MOUTH FOUR TIMES A DAY 10/03/21  Yes Midge Minium, MD  ibuprofen (ADVIL) 200 MG tablet Take 400 mg by  mouth in the morning and at bedtime.   Yes [provider]  Methylcellulose, Laxative, (CITRUCEL) 500 MG TABS Take 2,000 mg by mouth daily.   Yes [provider]  Multiple Vitamins-Minerals (CENTRUM SILVER 50+WOMEN PO) Take 1 tablet by mouth daily.   Yes [provider]  mupirocin ointment (BACTROBAN) 2 % Apply 1 application topically 2 (two) times daily. 11/23/21  Yes Maximiano Coss, NP  omeprazole (PRILOSEC) 20 MG capsule Take 1 capsule (20 mg total) by mouth daily. 01/12/21  Yes Irene Shipper, MD  Probiotic Product (ADVANCED PROBIOTIC 10)  CAPS Take 1 capsule by mouth daily.   Yes [provider]  montelukast (SINGULAIR) 10 MG tablet Take 1 tablet by mouth at bedtime. 02/11/21   [provider]  sulfamethoxazole-trimethoprim (BACTRIM DS) 800-160 MG tablet Take 1 tablet by mouth 2 (two) times daily. 12/07/21   Maximiano Coss, NP    No Known Allergies  Social History   Socioeconomic History   Marital status: Married    Spouse name: Not on file   Number of children: 2   Years of education: Not on file   Highest education level: Not on file  Occupational History   Occupation: retired  Tobacco Use   Smoking status: Former    Types: Cigarettes    Quit date: 02/18/1981    Years since quitting: 40.8   Smokeless tobacco: Never  Vaping Use   Vaping Use: Never used  Substance and Sexual Activity   Alcohol use: Yes    Alcohol/week: 2.0 standard drinks    Types: 2 Glasses of wine per week    Comment: 2 glasses 4 days/week   Drug use: No   Sexual activity: Yes  Other Topics Concern   Not on file  Social History Narrative   Not on file   Social Determinants of Health   Financial Resource Strain: Low Risk    Difficulty of Paying Living Expenses: Not hard at all  Food Insecurity: No Food Insecurity   Worried About Charity fundraiser in the Last Year: Never true   [place] in the Last Year: Never true  Transportation Needs: No Transportation Needs   Lack of Transportation (Medical): No   Lack of Transportation (Non-Medical): No  Physical Activity: Insufficiently Active   Days of Exercise per Week: 4 days   Minutes of Exercise per Session: 30 min  Stress: No Stress Concern Present   Feeling of Stress : Not at all  Social Connections: Socially Integrated   Frequency of Communication with Friends and Family: More than three times a week   Frequency of Social Gatherings with Friends and Family: More than three times a week   Attends Religious Services: More than 4 times per year   Active Member  of [employer] or Organizations: Yes   Attends Archivist Meetings: 1 to 4 times per year   Marital Status: Married  Human resources officer Violence: Not At Risk   Fear of Current or Ex-Partner: No   Emotionally Abused: No   Physically Abused: No   Sexually Abused: No    Tobacco Use: Medium Risk   Smoking Tobacco Use: Former   Smokeless Tobacco Use: Never   Passive Exposure: Not on file   Social History   Substance and Sexual Activity  Alcohol Use Yes   Alcohol/week: 2.0 standard drinks   Types: 2 Glasses of wine per week   Comment: 2 glasses 4 days/week    Family History  Problem Relation Age of Onset   Diabetes Mother    Arthritis Father    Cancer Maternal Aunt        liver   Heart disease Paternal Grandfather    Prostate cancer Brother    Colon cancer Neg Hx    Pancreatic cancer Neg Hx    Stomach cancer Neg Hx     ROS: Constitutional: no fever, no chills, no night sweats, no significant weight loss Cardiovascular: no chest pain, no palpitations Respiratory: no cough, no shortness of breath, No COPD Gastrointestinal: no vomiting, no nausea Musculoskeletal: no swelling in Joints, Joint Pain Neurologic: no numbness, no tingling, no difficulty with balance    Objective:  Physical Exam: Well nourished and well developed.  General: Alert and oriented x3, cooperative and pleasant, no acute distress.  Head: normocephalic, atraumatic, neck supple.  Eyes: EOMI.  Respiratory: breath sounds clear in all fields, no wheezing, rales, or rhonchi. Cardiovascular: Regular rate and rhythm, no murmurs, gallops or rubs.  Abdomen: non-tender to palpation and soft, normoactive bowel sounds. Musculoskeletal:  Antalgic gait pattern favoring the right side without the use of assisted devices.     Right Hip Exam:   ROM: Flexion to 100, Internal Rotation 0, External Rotation 20, and abduction 20 without discomfort.   There is no tenderness over the greater trochanter bursa.    There is no pain on provocative testing of the hip.     Left Hip Exam:   ROM: Flexion to 100, Internal Rotation 5, External Rotation 20, and abduction 20 without discomfort.   There is No tenderness over the greater trochanter bursa.   Calves soft and nontender. Motor function intact in LE. Strength 5/5 LE bilaterally. Neuro: Distal pulses 2+. Sensation to light touch intact in LE.   Vital signs in last 24 hours:    Imaging Review Radiographs- AP pelvis, AP and lateral of the bilateral hips dated 01/2021 demonstrate bone-on-bone arthritis in both hips, slightly worse on the right than the left. She has evidence of a spinal fusion.  Assessment/Plan:  End stage arthritis, left hip  The patient history, physical examination, clinical judgement of the provider and imaging studies are consistent with end stage degenerative joint disease of the left hip and total hip arthroplasty is deemed medically necessary. The treatment options including medical management, injection therapy, arthroscopy and arthroplasty were discussed at length. The risks and benefits of total hip arthroplasty were presented and reviewed. The risks due to aseptic loosening, infection, stiffness, dislocation/subluxation, thromboembolic complications and other imponderables were discussed. The patient acknowledged the explanation, agreed to proceed with the plan and consent was signed. Patient is being admitted for inpatient treatment for surgery, pain control, PT, OT, prophylactic antibiotics, VTE prophylaxis, progressive ambulation and ADLs and discharge planning.The patient is planning to be discharged  home .   Patient's anticipated LOS is less than 2 midnights, meeting these requirements: - Younger than 75 - Lives within 1 hour of care - Has a competent adult at home to recover with post-op recover - NO history of  - Chronic pain requiring opiods  - Diabetes  - Coronary Artery Disease  - Heart failure  - Heart  attack  - Stroke  - DVT/VTE  - Cardiac arrhythmia  - Respiratory Failure/COPD  - Renal failure  - Anemia  - Advanced Liver disease    Therapy Plans: HEP Disposition: Home with Husband Planned DVT Prophylaxis: Xarelto 10mg  (History of Breast Cancer) DME Needed: RW PCP: Annye Asa, MD (clearance received)  TXA: IV Allergies: None Anesthesia Concerns: None BMI: 23.3 Last HgbA1c: N/A  Pharmacy: [facility] [address]   - Patient was instructed on what medications to stop prior to surgery. - Follow-up visit in 2 weeks with Dr. Wynelle Link - Begin physical therapy following surgery - Pre-operative lab work as pre-surgical testing - Prescriptions will be provided in hospital at time of discharge  Fenton Foy, [facility], PA-C Orthopedic Surgery [facility] [place]

## 2021-12-19 ENCOUNTER — Encounter (HOSPITAL_COMMUNITY)
Admission: RE | Admit: 2021-12-19 | Discharge: 2021-12-19 | Disposition: A | Discharge status: Home / Self Care | Payer: Medicare HMO | Source: Ambulatory Visit | Attending: Orthopedic Surgery | Admitting: Orthopedic Surgery

## 2021-12-19 ENCOUNTER — Telehealth: Payer: Self-pay | Admitting: Family Medicine

## 2021-12-19 ENCOUNTER — Other Ambulatory Visit: Payer: Self-pay

## 2021-12-19 DIAGNOSIS — Z01812 Encounter for preprocedural laboratory examination: Secondary | ICD-10-CM

## 2021-12-19 DIAGNOSIS — Z20822 Contact with and (suspected) exposure to covid-19: Secondary | ICD-10-CM | POA: Insufficient documentation

## 2021-12-19 LAB — SARS CORONAVIRUS 2 (TAT 6-24 HRS): SARS Coronavirus 2: NEGATIVE

## 2021-12-19 NOTE — Telephone Encounter (Signed)
tabitha from [facility] called in asking for office notes, labs, and meds list, and vaccine record.  Please fax attention to [facility] fax # [phone]

## 2021-12-19 NOTE — Telephone Encounter (Signed)
faxed

## 2021-12-20 NOTE — Anesthesia Preprocedure Evaluation (Addendum)
Anesthesia Evaluation  Patient identified by MRN, date of birth, ID band Patient awake    Reviewed: Allergy & Precautions, NPO status , Patient's Chart, lab work & pertinent test results  Airway Mallampati: II  TM Distance: >3 FB     Dental no notable dental hx.    Pulmonary sleep apnea and Continuous Positive Airway Pressure Ventilation , former smoker,    Pulmonary exam normal        Cardiovascular negative cardio ROS   Rhythm:Regular Rate:Normal     Neuro/Psych negative neurological ROS  negative psych ROS   GI/Hepatic Neg liver ROS, GERD  Medicated,  Endo/Other  negative endocrine ROS  Renal/GU negative Renal ROS  negative genitourinary   Musculoskeletal  (+) Arthritis , Osteoarthritis,  T10-pelvis fusion   Abdominal Normal abdominal exam  (+)   Peds  Hematology negative hematology ROS (+)   Anesthesia Other Findings   Reproductive/Obstetrics                           Anesthesia Physical Anesthesia Plan  ASA: 2  Anesthesia Plan: MAC and Spinal   Post-op Pain Management:    Induction: Intravenous  PONV Risk Score and Plan: 2 and Ondansetron, Dexamethasone, Propofol infusion and Treatment may vary due to age or medical condition  Airway Management Planned: Simple Face Mask, Natural Airway and Nasal Cannula  Additional Equipment: None  Intra-op Plan:   Post-operative Plan:   Informed Consent: I have reviewed the patients History and Physical, chart, labs and discussed the procedure including the risks, benefits and alternatives for the proposed anesthesia with the patient or authorized representative who has indicated his/her understanding and acceptance.     Dental advisory given  Plan Discussed with: CRNA  Anesthesia Plan Comments: (Lab Results      Component                Value               Date                      WBC                      4.7                  12/08/2021                HGB                      11.6 (L)            12/08/2021                HCT                      33.9 (L)            12/08/2021                MCV                      98.0                12/08/2021                PLT  257                 12/08/2021           Lab Results      Component                Value               Date                      NA                       129 (L)             12/08/2021                K                        4.4                 12/08/2021                CO2                      24                  12/08/2021                GLUCOSE                  98                  12/08/2021                BUN                      10                  12/08/2021                CREATININE               0.99                12/08/2021                CALCIUM                  9.6                 12/08/2021                EGFR                     74 (L)              04/04/2017                GFRNONAA                 58 (L)              12/08/2021          )       Anesthesia Quick Evaluation

## 2021-12-21 ENCOUNTER — Ambulatory Visit (HOSPITAL_COMMUNITY): Payer: Medicare HMO | Admitting: Anesthesiology

## 2021-12-21 ENCOUNTER — Encounter (HOSPITAL_COMMUNITY)
Admission: RE | Disposition: A | Discharge status: Home / Self Care | Payer: Self-pay | Source: Ambulatory Visit | Attending: Orthopedic Surgery

## 2021-12-21 ENCOUNTER — Encounter (HOSPITAL_COMMUNITY): Payer: Self-pay | Admitting: Orthopedic Surgery

## 2021-12-21 ENCOUNTER — Ambulatory Visit (HOSPITAL_COMMUNITY): Payer: Medicare HMO

## 2021-12-21 ENCOUNTER — Observation Stay (HOSPITAL_COMMUNITY)
Admission: RE | Admit: 2021-12-21 | Discharge: 2021-12-22 | Disposition: A | Discharge status: Home / Self Care | Payer: Medicare HMO | Source: Ambulatory Visit | Attending: Orthopedic Surgery | Admitting: Orthopedic Surgery

## 2021-12-21 ENCOUNTER — Other Ambulatory Visit: Payer: Self-pay

## 2021-12-21 ENCOUNTER — Observation Stay (HOSPITAL_COMMUNITY): Payer: Medicare HMO

## 2021-12-21 ENCOUNTER — Ambulatory Visit (HOSPITAL_COMMUNITY): Payer: Medicare HMO | Admitting: Physician Assistant

## 2021-12-21 DIAGNOSIS — Z96649 Presence of unspecified artificial hip joint: Secondary | ICD-10-CM

## 2021-12-21 DIAGNOSIS — M169 Osteoarthritis of hip, unspecified: Secondary | ICD-10-CM | POA: Diagnosis present

## 2021-12-21 DIAGNOSIS — Z79899 Other long term (current) drug therapy: Secondary | ICD-10-CM | POA: Insufficient documentation

## 2021-12-21 DIAGNOSIS — Z96642 Presence of left artificial hip joint: Secondary | ICD-10-CM | POA: Diagnosis not present

## 2021-12-21 DIAGNOSIS — Z87891 Personal history of nicotine dependence: Secondary | ICD-10-CM | POA: Diagnosis not present

## 2021-12-21 DIAGNOSIS — Z01812 Encounter for preprocedural laboratory examination: Secondary | ICD-10-CM

## 2021-12-21 DIAGNOSIS — M1611 Unilateral primary osteoarthritis, right hip: Secondary | ICD-10-CM

## 2021-12-21 DIAGNOSIS — Z471 Aftercare following joint replacement surgery: Secondary | ICD-10-CM | POA: Diagnosis not present

## 2021-12-21 DIAGNOSIS — M1612 Unilateral primary osteoarthritis, left hip: Principal | ICD-10-CM | POA: Insufficient documentation

## 2021-12-21 DIAGNOSIS — Z853 Personal history of malignant neoplasm of breast: Secondary | ICD-10-CM | POA: Diagnosis not present

## 2021-12-21 DIAGNOSIS — Z20822 Contact with and (suspected) exposure to covid-19: Secondary | ICD-10-CM | POA: Diagnosis not present

## 2021-12-21 DIAGNOSIS — Z419 Encounter for procedure for purposes other than remedying health state, unspecified: Secondary | ICD-10-CM

## 2021-12-21 HISTORY — PX: TOTAL HIP ARTHROPLASTY: SHX124

## 2021-12-21 LAB — ABO/RH: ABO/RH(D): O POS

## 2021-12-21 SURGERY — ARTHROPLASTY, HIP, TOTAL, ANTERIOR APPROACH
Anesthesia: Monitor Anesthesia Care | Site: Hip | Laterality: Left

## 2021-12-21 MED ORDER — EPHEDRINE SULFATE-NACL 50-0.9 MG/10ML-% IV SOSY
PREFILLED_SYRINGE | INTRAVENOUS | Status: DC | PRN
  Administered 2021-12-21: 5 mg via INTRAVENOUS
  Administered 2021-12-21: 10 mg via INTRAVENOUS
  Administered 2021-12-21: 5 mg via INTRAVENOUS

## 2021-12-21 MED ORDER — RIVAROXABAN 10 MG PO TABS
10.0000 mg | ORAL_TABLET | Freq: Every day | ORAL | Status: DC
  Administered 2021-12-22: 10 mg via ORAL
  Filled 2021-12-21: qty 1

## 2021-12-21 MED ORDER — EPHEDRINE 5 MG/ML INJ
INTRAVENOUS | Status: AC
  Filled 2021-12-21: qty 5

## 2021-12-21 MED ORDER — TRAMADOL HCL 50 MG PO TABS
50.0000 mg | ORAL_TABLET | Freq: Four times a day (QID) | ORAL | Status: DC | PRN
  Administered 2021-12-21: 100 mg via ORAL

## 2021-12-21 MED ORDER — DEXAMETHASONE SODIUM PHOSPHATE 10 MG/ML IJ SOLN
INTRAMUSCULAR | Status: DC | PRN
  Administered 2021-12-21: 6 mg via INTRAVENOUS

## 2021-12-21 MED ORDER — GABAPENTIN 300 MG PO CAPS
300.0000 mg | ORAL_CAPSULE | Freq: Four times a day (QID) | ORAL | Status: DC
  Administered 2021-12-21 – 2021-12-22 (×5): 300 mg via ORAL
  Filled 2021-12-21 (×6): qty 1

## 2021-12-21 MED ORDER — POVIDONE-IODINE 10 % EX SWAB
2.0000 "application " | Freq: Once | CUTANEOUS | Status: AC
  Administered 2021-12-21: 2 via TOPICAL

## 2021-12-21 MED ORDER — 0.9 % SODIUM CHLORIDE (POUR BTL) OPTIME
TOPICAL | Status: DC | PRN
  Administered 2021-12-21: 1000 mL

## 2021-12-21 MED ORDER — PANTOPRAZOLE SODIUM 40 MG PO TBEC
40.0000 mg | DELAYED_RELEASE_TABLET | Freq: Every day | ORAL | Status: DC
  Administered 2021-12-22: 40 mg via ORAL
  Filled 2021-12-21: qty 1

## 2021-12-21 MED ORDER — CHLORHEXIDINE GLUCONATE 0.12 % MT SOLN
15.0000 mL | Freq: Once | OROMUCOSAL | Status: AC
  Administered 2021-12-21: 15 mL via OROMUCOSAL

## 2021-12-21 MED ORDER — ONDANSETRON HCL 4 MG/2ML IJ SOLN
INTRAMUSCULAR | Status: DC | PRN
  Administered 2021-12-21: 4 mg via INTRAVENOUS

## 2021-12-21 MED ORDER — FENTANYL CITRATE PF 50 MCG/ML IJ SOSY: 25.0000 ug | PREFILLED_SYRINGE | INTRAMUSCULAR | Status: DC | PRN

## 2021-12-21 MED ORDER — ACETAMINOPHEN 325 MG PO TABS: 325.0000 mg | ORAL_TABLET | Freq: Four times a day (QID) | ORAL | Status: DC | PRN

## 2021-12-21 MED ORDER — ONDANSETRON HCL 4 MG PO TABS: 4.0000 mg | ORAL_TABLET | Freq: Four times a day (QID) | ORAL | Status: DC | PRN

## 2021-12-21 MED ORDER — ACETAMINOPHEN 10 MG/ML IV SOLN
1000.0000 mg | Freq: Four times a day (QID) | INTRAVENOUS | Status: DC
  Administered 2021-12-21: 1000 mg via INTRAVENOUS
  Filled 2021-12-21: qty 100

## 2021-12-21 MED ORDER — METHOCARBAMOL 500 MG PO TABS
ORAL_TABLET | ORAL | Status: AC
  Filled 2021-12-21: qty 1

## 2021-12-21 MED ORDER — CEFAZOLIN SODIUM-DEXTROSE 2-4 GM/100ML-% IV SOLN
2.0000 g | INTRAVENOUS | Status: AC
  Administered 2021-12-21: 2 g via INTRAVENOUS
  Filled 2021-12-21: qty 100

## 2021-12-21 MED ORDER — ACETAMINOPHEN 10 MG/ML IV SOLN: 1000.0000 mg | Freq: Once | INTRAVENOUS | Status: DC | PRN

## 2021-12-21 MED ORDER — CEFAZOLIN SODIUM-DEXTROSE 2-4 GM/100ML-% IV SOLN
2.0000 g | Freq: Four times a day (QID) | INTRAVENOUS | Status: AC
  Administered 2021-12-21 (×2): 2 g via INTRAVENOUS
  Filled 2021-12-21 (×2): qty 100

## 2021-12-21 MED ORDER — TRAMADOL HCL 50 MG PO TABS
ORAL_TABLET | ORAL | Status: AC
  Filled 2021-12-21: qty 2

## 2021-12-21 MED ORDER — PROPOFOL 10 MG/ML IV BOLUS
INTRAVENOUS | Status: AC
  Filled 2021-12-21: qty 20

## 2021-12-21 MED ORDER — FENTANYL CITRATE (PF) 100 MCG/2ML IJ SOLN
INTRAMUSCULAR | Status: DC | PRN
Start: 2021-12-21 — End: 2021-12-21
  Administered 2021-12-21 (×2): 25 ug via INTRAVENOUS

## 2021-12-21 MED ORDER — LACTATED RINGERS IV SOLN: INTRAVENOUS | Status: DC

## 2021-12-21 MED ORDER — POLYETHYLENE GLYCOL 3350 17 G PO PACK: 17.0000 g | PACK | Freq: Every day | ORAL | Status: DC | PRN

## 2021-12-21 MED ORDER — MONTELUKAST SODIUM 10 MG PO TABS
10.0000 mg | ORAL_TABLET | Freq: Every day | ORAL | Status: DC
  Administered 2021-12-21: 10 mg via ORAL
  Filled 2021-12-21: qty 1

## 2021-12-21 MED ORDER — DEXAMETHASONE SODIUM PHOSPHATE 10 MG/ML IJ SOLN: 8.0000 mg | Freq: Once | INTRAMUSCULAR | Status: DC

## 2021-12-21 MED ORDER — PROPOFOL 500 MG/50ML IV EMUL
INTRAVENOUS | Status: DC | PRN
  Administered 2021-12-21: 50 ug/kg/min via INTRAVENOUS

## 2021-12-21 MED ORDER — PROPOFOL 500 MG/50ML IV EMUL
INTRAVENOUS | Status: AC
  Filled 2021-12-21: qty 50

## 2021-12-21 MED ORDER — METOCLOPRAMIDE HCL 5 MG/ML IJ SOLN: 5.0000 mg | Freq: Three times a day (TID) | INTRAMUSCULAR | Status: DC | PRN

## 2021-12-21 MED ORDER — WATER FOR IRRIGATION, STERILE IR SOLN
Status: DC | PRN
  Administered 2021-12-21 (×2): 1000 mL

## 2021-12-21 MED ORDER — DEXAMETHASONE SODIUM PHOSPHATE 10 MG/ML IJ SOLN
10.0000 mg | Freq: Once | INTRAMUSCULAR | Status: AC
  Administered 2021-12-22: 10 mg via INTRAVENOUS
  Filled 2021-12-21: qty 1

## 2021-12-21 MED ORDER — SODIUM CHLORIDE 0.9 % IV SOLN: INTRAVENOUS | Status: DC

## 2021-12-21 MED ORDER — PHENOL 1.4 % MT LIQD: 1.0000 | OROMUCOSAL | Status: DC | PRN

## 2021-12-21 MED ORDER — TRANEXAMIC ACID-NACL 1000-0.7 MG/100ML-% IV SOLN
1000.0000 mg | INTRAVENOUS | Status: AC
  Administered 2021-12-21: 1000 mg via INTRAVENOUS
  Filled 2021-12-21: qty 100

## 2021-12-21 MED ORDER — ONDANSETRON HCL 4 MG/2ML IJ SOLN
INTRAMUSCULAR | Status: AC
  Filled 2021-12-21: qty 2

## 2021-12-21 MED ORDER — MORPHINE SULFATE (PF) 2 MG/ML IV SOLN
0.5000 mg | INTRAVENOUS | Status: DC | PRN
  Administered 2021-12-22: 1 mg via INTRAVENOUS
  Filled 2021-12-21: qty 1

## 2021-12-21 MED ORDER — PROPOFOL 10 MG/ML IV BOLUS
INTRAVENOUS | Status: DC | PRN
  Administered 2021-12-21: 10 mg via INTRAVENOUS
  Administered 2021-12-21: 20 mg via INTRAVENOUS
  Administered 2021-12-21: 10 mg via INTRAVENOUS
  Administered 2021-12-21: 20 mg via INTRAVENOUS
  Administered 2021-12-21: 30 mg via INTRAVENOUS

## 2021-12-21 MED ORDER — BUPIVACAINE-EPINEPHRINE (PF) 0.25% -1:200000 IJ SOLN
INTRAMUSCULAR | Status: DC | PRN
  Administered 2021-12-21: 30 mL via PERINEURAL

## 2021-12-21 MED ORDER — DEXAMETHASONE SODIUM PHOSPHATE 10 MG/ML IJ SOLN
INTRAMUSCULAR | Status: AC
  Filled 2021-12-21: qty 1

## 2021-12-21 MED ORDER — METHOCARBAMOL 500 MG IVPB - SIMPLE MED
500.0000 mg | Freq: Four times a day (QID) | INTRAVENOUS | Status: DC | PRN
  Filled 2021-12-21: qty 50

## 2021-12-21 MED ORDER — BUPIVACAINE-EPINEPHRINE (PF) 0.25% -1:200000 IJ SOLN
INTRAMUSCULAR | Status: AC
  Filled 2021-12-21: qty 60

## 2021-12-21 MED ORDER — PHENYLEPHRINE HCL (PRESSORS) 10 MG/ML IV SOLN
INTRAVENOUS | Status: AC
  Filled 2021-12-21: qty 1

## 2021-12-21 MED ORDER — BUPIVACAINE IN DEXTROSE 0.75-8.25 % IT SOLN
INTRATHECAL | Status: DC | PRN
  Administered 2021-12-21: 1.6 mL via INTRATHECAL

## 2021-12-21 MED ORDER — ORAL CARE MOUTH RINSE: 15.0000 mL | Freq: Once | OROMUCOSAL | Status: AC

## 2021-12-21 MED ORDER — BISACODYL 10 MG RE SUPP: 10.0000 mg | Freq: Every day | RECTAL | Status: DC | PRN

## 2021-12-21 MED ORDER — MENTHOL 3 MG MT LOZG: 1.0000 | LOZENGE | OROMUCOSAL | Status: DC | PRN

## 2021-12-21 MED ORDER — HYDROCODONE-ACETAMINOPHEN 7.5-325 MG PO TABS: 1.0000 | ORAL_TABLET | ORAL | Status: DC | PRN

## 2021-12-21 MED ORDER — HYDROCODONE-ACETAMINOPHEN 5-325 MG PO TABS
1.0000 | ORAL_TABLET | ORAL | Status: DC | PRN
  Administered 2021-12-21 – 2021-12-22 (×5): 1 via ORAL
  Filled 2021-12-21 (×6): qty 1

## 2021-12-21 MED ORDER — METOCLOPRAMIDE HCL 5 MG PO TABS: 5.0000 mg | ORAL_TABLET | Freq: Three times a day (TID) | ORAL | Status: DC | PRN

## 2021-12-21 MED ORDER — METHOCARBAMOL 500 MG PO TABS
500.0000 mg | ORAL_TABLET | Freq: Four times a day (QID) | ORAL | Status: DC | PRN
  Administered 2021-12-21 – 2021-12-22 (×4): 500 mg via ORAL
  Filled 2021-12-21 (×4): qty 1

## 2021-12-21 MED ORDER — DOCUSATE SODIUM 100 MG PO CAPS
100.0000 mg | ORAL_CAPSULE | Freq: Two times a day (BID) | ORAL | Status: DC
  Administered 2021-12-21 – 2021-12-22 (×3): 100 mg via ORAL
  Filled 2021-12-21 (×3): qty 1

## 2021-12-21 MED ORDER — LACTATED RINGERS IV SOLN
INTRAVENOUS | Status: DC
Start: 2021-12-21 — End: 2021-12-21

## 2021-12-21 MED ORDER — CHLORHEXIDINE GLUCONATE CLOTH 2 % EX PADS
6.0000 | MEDICATED_PAD | Freq: Every day | CUTANEOUS | Status: DC
  Administered 2021-12-21: 6 via TOPICAL

## 2021-12-21 MED ORDER — ONDANSETRON HCL 4 MG/2ML IJ SOLN: 4.0000 mg | Freq: Four times a day (QID) | INTRAMUSCULAR | Status: DC | PRN

## 2021-12-21 MED ORDER — FENTANYL CITRATE (PF) 100 MCG/2ML IJ SOLN
INTRAMUSCULAR | Status: AC
  Filled 2021-12-21: qty 2

## 2021-12-21 SURGICAL SUPPLY — 44 items
BAG COUNTER SPONGE SURGICOUNT (BAG) IMPLANT
BAG DECANTER FOR FLEXI CONT (MISCELLANEOUS) IMPLANT
BAG SPEC THK2 15X12 ZIP CLS (MISCELLANEOUS)
BAG SPNG CNTER NS LX DISP (BAG)
BAG ZIPLOCK 12X15 (MISCELLANEOUS) IMPLANT
BLADE SAG 18X100X1.27 (BLADE) ×2 IMPLANT
COVER PERINEAL POST (MISCELLANEOUS) ×2 IMPLANT
COVER SURGICAL LIGHT HANDLE (MISCELLANEOUS) ×2 IMPLANT
CUP ACETBLR 52 OD PINNACLE (Hips) ×1 IMPLANT
DRAPE FOOT SWITCH (DRAPES) ×2 IMPLANT
DRAPE STERI IOBAN 125X83 (DRAPES) ×2 IMPLANT
DRAPE U-SHAPE 47X51 STRL (DRAPES) ×4 IMPLANT
DRSG AQUACEL AG ADV 3.5X10 (GAUZE/BANDAGES/DRESSINGS) ×2 IMPLANT
DURAPREP 26ML APPLICATOR (WOUND CARE) ×2 IMPLANT
ELECT REM PT RETURN 15FT ADLT (MISCELLANEOUS) ×2 IMPLANT
GLOVE SRG 8 PF TXTR STRL LF DI (GLOVE) ×1 IMPLANT
GLOVE SURG ENC MOIS LTX SZ6.5 (GLOVE) ×2 IMPLANT
GLOVE SURG ENC MOIS LTX SZ7 (GLOVE) ×2 IMPLANT
GLOVE SURG ENC MOIS LTX SZ8 (GLOVE) ×4 IMPLANT
GLOVE SURG UNDER POLY LF SZ7 (GLOVE) ×2 IMPLANT
GLOVE SURG UNDER POLY LF SZ8 (GLOVE) ×2
GLOVE SURG UNDER POLY LF SZ8.5 (GLOVE) IMPLANT
GOWN STRL REUS W/TWL LRG LVL3 (GOWN DISPOSABLE) ×4 IMPLANT
GOWN STRL REUS W/TWL XL LVL3 (GOWN DISPOSABLE) IMPLANT
HEAD FEM STD 32X+1 STRL (Hips) ×1 IMPLANT
HOLDER FOLEY CATH W/STRAP (MISCELLANEOUS) ×2 IMPLANT
KIT TURNOVER KIT A (KITS) IMPLANT
LINER MARATHON NEUT +4X52X32 (Hips) ×1 IMPLANT
MANIFOLD NEPTUNE II (INSTRUMENTS) ×2 IMPLANT
PACK ANTERIOR HIP CUSTOM (KITS) ×2 IMPLANT
PENCIL SMOKE EVACUATOR COATED (MISCELLANEOUS) ×2 IMPLANT
SPIKE FLUID TRANSFER (MISCELLANEOUS) ×2 IMPLANT
SPONGE T-LAP 18X18 ~~LOC~~+RFID (SPONGE) ×6 IMPLANT
STEM FEMORAL SZ5 HIGH ACTIS (Stem) ×1 IMPLANT
STRIP CLOSURE SKIN 1/2X4 (GAUZE/BANDAGES/DRESSINGS) ×2 IMPLANT
SUT ETHIBOND NAB CT1 #1 30IN (SUTURE) ×2 IMPLANT
SUT MNCRL AB 4-0 PS2 18 (SUTURE) ×2 IMPLANT
SUT STRATAFIX 0 PDS 27 VIOLET (SUTURE) ×2
SUT VIC AB 2-0 CT1 27 (SUTURE) ×4
SUT VIC AB 2-0 CT1 TAPERPNT 27 (SUTURE) ×2 IMPLANT
SUTURE STRATFX 0 PDS 27 VIOLET (SUTURE) ×1 IMPLANT
SYR 50ML LL SCALE MARK (SYRINGE) IMPLANT
TRAY FOLEY MTR SLVR 16FR STAT (SET/KITS/TRAYS/PACK) ×2 IMPLANT
TUBE SUCTION HIGH CAP CLEAR NV (SUCTIONS) ×2 IMPLANT

## 2021-12-21 NOTE — Evaluation (Signed)
Physical Therapy Evaluation Patient Details Name: Elizabeth [name] MRN: [id number] DOB: 15-May-1941 Today's Date: 12/21/2021  History of Present Illness  81 y.o. female admitted 12/21/21 for L AA-THA. PMH includes breast cancer, sleep apnea, spinal fusion.  Clinical Impression  Pt is s/p THA resulting in the deficits listed below (see PT Problem List). Pt ambulated 22' with RW, no loss of balance. Initiated THA HEP. Good progress expected.  Pt will benefit from skilled PT to increase their independence and safety with mobility to allow discharge to the venue listed below.         Recommendations for follow up therapy are one component of a multi-disciplinary discharge planning process, led by the attending physician.  Recommendations may be updated based on patient status, additional functional criteria and insurance authorization.  Follow Up Recommendations Follow physician's recommendations for discharge plan and follow up therapies    Assistance Recommended at Discharge Intermittent Supervision/Assistance  Patient can return home with the following  A little help with bathing/dressing/bathroom;Assist for transportation;Help with stairs or ramp for entrance    Equipment Recommendations Rolling walker (2 wheels)  Recommendations for Other Services       Functional Status Assessment Patient has had a recent decline in their functional status and demonstrates the ability to make significant improvements in function in a reasonable and predictable amount of time.     Precautions / Restrictions Precautions Precautions: Fall Restrictions Weight Bearing Restrictions: No      Mobility  Bed Mobility Overal bed mobility: Needs Assistance Bed Mobility: Supine to Sit     Supine to sit: Min assist, HOB elevated     General bed mobility comments: assist to support LLE and pivot hips to EOB, used rail    Transfers Overall transfer level: Needs assistance Equipment used:  Rolling walker (2 wheels) Transfers: Sit to/from Stand Sit to Stand: Min guard           General transfer comment: VCs hand placement    Ambulation/Gait Ambulation/Gait assistance: Min guard Gait Distance (Feet): 60 Feet Assistive device: Rolling walker (2 wheels) Gait Pattern/deviations: Step-to pattern, Decreased step length - right, Decreased step length - left Gait velocity: decr     General Gait Details: VCs sequencing, no loss of balance  Stairs            Wheelchair Mobility    Modified Rankin (Stroke Patients Only)       Balance Overall balance assessment: Modified Independent                                           Pertinent Vitals/Pain Pain Assessment Pain Assessment: 0-10 Pain Score: 5  Pain Location: L hip with activity Pain Descriptors / Indicators: Sore Pain Intervention(s): Limited activity within patient's tolerance, Monitored during session, Premedicated before session    Home Living Family/patient expects to be discharged to:: Private residence Living Arrangements: Spouse/significant other Available Help at Discharge: Family;Available 24 hours/day   Home Access: Level entry       Home Layout: One level Home Equipment: None      Prior Function Prior Level of Function : Independent/Modified Independent             Mobility Comments: does Pilates 3x/week, independent with mobility without AD ADLs Comments: independent     Hand Dominance        Extremity/Trunk Assessment   Upper Extremity  Assessment Upper Extremity Assessment: Overall WFL for tasks assessed    Lower Extremity Assessment Lower Extremity Assessment: LLE deficits/detail LLE Deficits / Details: hip 2/5, hip flexion AAROM ~40*, hip abduction AAROM ~10* limited by pain, knee ext at least 3/5 LLE Sensation: WNL LLE Coordination: WNL    Cervical / Trunk Assessment Cervical / Trunk Assessment: Normal  Communication   Communication: No  difficulties  Cognition Arousal/Alertness: Awake/alert Behavior During Therapy: WFL for tasks assessed/performed Overall Cognitive Status: Within Functional Limits for tasks assessed                                          General Comments      Exercises Total Joint Exercises Ankle Circles/Pumps: AROM, Both, 10 reps, Supine Heel Slides: AAROM, Left, 5 reps, Supine Hip ABduction/ADduction: AAROM, Left, 5 reps, Supine Long Arc Quad: AROM, Left, 5 reps, Seated   Assessment/Plan    PT Assessment Patient needs continued PT services  PT Problem List Decreased mobility;Decreased range of motion;Decreased strength;Decreased activity tolerance;Decreased balance;Pain;Decreased knowledge of use of DME       PT Treatment Interventions DME instruction;Gait training;Therapeutic exercise;Patient/family education    PT Goals (Current goals can be found in the Care Plan section)  Acute Rehab PT Goals Patient Stated Goal: to walk without pain, pilates PT Goal Formulation: With patient/family Time For Goal Achievement: 12/28/21 Potential to Achieve Goals: Good    Frequency 7X/week     Co-evaluation               AM-PAC PT "6 Clicks" Mobility  Outcome Measure Help needed turning from your back to your side while in a flat bed without using bedrails?: A Little Help needed moving from lying on your back to sitting on the side of a flat bed without using bedrails?: A Little Help needed moving to and from a bed to a chair (including a wheelchair)?: A Little Help needed standing up from a chair using your arms (e.g., wheelchair or bedside chair)?: A Little Help needed to walk in hospital room?: A Little Help needed climbing 3-5 steps with a railing? : A Little 6 Click Score: 18    End of Session Equipment Utilized During Treatment: Gait belt Activity Tolerance: Patient tolerated treatment well Patient left: in chair;with call bell/phone within reach;with  family/visitor present;with chair alarm set Nurse Communication: Mobility status PT Visit Diagnosis: Difficulty in walking, not elsewhere classified (R26.2);Pain Pain - Right/Left: Left Pain - part of body: Hip    Time: 2694-8546 PT Time Calculation (min) (ACUTE ONLY): 30 min   Charges:   PT Evaluation $PT Eval Moderate Complexity: 1 Mod PT Treatments $Gait Training: 8-22 mins       Blondell Reveal Kistler PT 12/21/2021  Acute Rehabilitation Services Pager [phone] Office [phone]

## 2021-12-21 NOTE — Anesthesia Procedure Notes (Signed)
Spinal  Patient location during procedure: OR Start time: 12/21/2021 8:17 AM End time: 12/21/2021 8:21 AM Staffing Performed: anesthesiologist  Anesthesiologist: Darral Dash, DO Preanesthetic Checklist Completed: patient identified, IV checked, site marked, risks and benefits discussed, surgical consent, monitors and equipment checked, pre-op evaluation and timeout performed Spinal Block Patient position: sitting Prep: DuraPrep Patient monitoring: heart rate, cardiac monitor, continuous pulse ox and blood pressure Approach: midline Location: L5-S1 Injection technique: single-shot Needle Needle type: Quincke  Needle gauge: 22 G Needle length: 9 cm Assessment Events: CSF return Additional Notes Patient identified. Risks/Benefits/Options discussed with patient including but not limited to bleeding, infection, nerve damage, paralysis, failed block, incomplete pain control, headache, blood pressure changes, nausea, vomiting, reactions to medications, itching and postpartum back pain. Confirmed with bedside nurse the patient's most recent platelet count. Confirmed with patient that they are not currently taking any anticoagulation, have any bleeding history or any family history of bleeding disorders. Patient expressed understanding and wished to proceed. All questions were answered. Sterile technique was used throughout the entire procedure. Please see nursing notes for vital signs. Warning signs of high block given to the patient including shortness of breath, tingling/numbness in hands, complete motor block, or any concerning symptoms with instructions to call for help. Patient was given instructions on fall risk and not to get out of bed. All questions and concerns addressed with instructions to call with any issues or inadequate analgesia.     - Difficult 2/2 to extensive prior instrumentation. Attempts X2 paramedian unsuccessful L3/L4, L5/S1. Single attempt midline L5/S1  success

## 2021-12-21 NOTE — Discharge Instructions (Addendum)
Frank Aluisio, MD Total Joint Specialist [facility] [address] [place], [place] [address] [phone]  ANTERIOR APPROACH TOTAL HIP REPLACEMENT POSTOPERATIVE DIRECTIONS     Hip Rehabilitation, Guidelines Following Surgery  The results of a hip operation are greatly improved after range of motion and muscle strengthening exercises. Follow all safety measures which are given to protect your hip. If any of these exercises cause increased pain or swelling in your joint, decrease the amount until you are comfortable again. Then slowly increase the exercises. Call your caregiver if you have problems or questions.   BLOOD CLOT PREVENTION Take a 10 mg Xarelto once a day for three weeks following surgery. Then take an 81 mg Aspirin once a day for three weeks. Then discontinue Aspirin. You may resume your vitamins/supplements once you have discontinued the Xarelto. Do not take any NSAIDs (Advil, Aleve, Ibuprofen, Meloxicam, etc.) until you have discontinued the Xarelto.   HOME CARE INSTRUCTIONS  Remove items at home which could result in a fall. This includes throw rugs or furniture in walking pathways.  ICE to the affected hip as frequently as 20-30 minutes an hour and then as needed for pain and swelling. Continue to use ice on the hip for pain and swelling from surgery. You may notice swelling that will progress down to the foot and ankle. This is normal after surgery. Elevate the leg when you are not up walking on it.   Continue to use the breathing machine which will help keep your temperature down.  It is common for your temperature to cycle up and down following surgery, especially at night when you are not up moving around and exerting yourself.  The breathing machine keeps your lungs expanded and your temperature down.  DIET You may resume your previous home diet once your are discharged from the hospital.  DRESSING / WOUND CARE / SHOWERING You have an  adhesive waterproof bandage over the incision. Leave this in place until your first follow-up appointment. Once you remove this you will not need to place another bandage.  You may begin showering 3 days following surgery, but do not submerge the incision under water.  ACTIVITY For the first 3-5 days, it is important to rest and keep the operative leg elevated. You should, as a general rule, rest for 50 minutes and walk/stretch for 10 minutes per hour. After 5 days, you may slowly increase activity as tolerated.  Perform the exercises you were provided twice a day for about 15-20 minutes each session. Begin these 2 days following surgery. Walk with your walker as instructed. Use the walker until you are comfortable transitioning to a cane. Walk with the cane in the opposite hand of the operative leg. You may discontinue the cane once you are comfortable and walking steadily. Avoid periods of inactivity such as sitting longer than an hour when not asleep. This helps prevent blood clots.  Do not drive a car for 6 weeks or until released by your surgeon.  Do not drive while taking narcotics.  TED HOSE STOCKINGS Wear the elastic stockings on both legs for three weeks following surgery during the day. You may remove them at night while sleeping.  WEIGHT BEARING Weight bearing as tolerated with assist device (walker, cane, etc) as directed, use it as long as suggested by your surgeon or therapist, typically at least 4-6 weeks.  POSTOPERATIVE CONSTIPATION PROTOCOL Constipation - defined medically as fewer than three stools per week and severe constipation as less   than one stool per week.  One of the most common issues patients have following surgery is constipation.  Even if you have a regular bowel pattern at home, your normal regimen is likely to be disrupted due to multiple reasons following surgery.  Combination of anesthesia, postoperative narcotics, change in appetite and fluid intake all can  affect your bowels.  In order to avoid complications following surgery, here are some recommendations in order to help you during your recovery period.  Colace (docusate) - Pick up an over-the-counter form of Colace or another stool softener and take twice a day as long as you are requiring postoperative pain medications.  Take with a full glass of water daily.  If you experience loose stools or diarrhea, hold the colace until you stool forms back up.  If your symptoms do not get better within 1 week or if they get worse, check with your doctor. Dulcolax (bisacodyl) - Pick up over-the-counter and take as directed by the product packaging as needed to assist with the movement of your bowels.  Take with a full glass of water.  Use this product as needed if not relieved by Colace only.  MiraLax (polyethylene glycol) - Pick up over-the-counter to have on hand.  MiraLax is a solution that will increase the amount of water in your bowels to assist with bowel movements.  Take as directed and can mix with a glass of water, juice, soda, coffee, or tea.  Take if you go more than two days without a movement.Do not use MiraLax more than once per day. Call your doctor if you are still constipated or irregular after using this medication for 7 days in a row.  If you continue to have problems with postoperative constipation, please contact the office for further assistance and recommendations.  If you experience "the worst abdominal pain ever" or develop nausea or vomiting, please contact the office immediatly for further recommendations for treatment.  ITCHING  If you experience itching with your medications, try taking only a single pain pill, or even half a pain pill at a time.  You can also use Benadryl over the counter for itching or also to help with sleep.   MEDICATIONS See your medication summary on the "After Visit Summary" that the nursing staff will review with you prior to discharge.  You may have some home  medications which will be placed on hold until you complete the course of blood thinner medication.  It is important for you to complete the blood thinner medication as prescribed by your surgeon.  Continue your approved medications as instructed at time of discharge.  PRECAUTIONS If you experience chest pain or shortness of breath - call 911 immediately for transfer to the hospital emergency department.  If you develop a fever greater that 101 F, purulent drainage from wound, increased redness or drainage from wound, foul odor from the wound/dressing, or calf pain - CONTACT YOUR SURGEON.                                                   FOLLOW-UP APPOINTMENTS Make sure you keep all of your appointments after your operation with your surgeon and caregivers. You should call the office at the above phone number and make an appointment for approximately two weeks after the date of your surgery or on the date   instructed by your surgeon outlined in the "After Visit Summary".  RANGE OF MOTION AND STRENGTHENING EXERCISES  These exercises are designed to help you keep full movement of your hip joint. Follow your caregiver's or physical therapist's instructions. Perform all exercises about fifteen times, three times per day or as directed. Exercise both hips, even if you have had only one joint replacement. These exercises can be done on a training (exercise) mat, on the floor, on a table or on a bed. Use whatever works the best and is most comfortable for you. Use music or television while you are exercising so that the exercises are a pleasant break in your day. This will make your life better with the exercises acting as a break in routine you can look forward to.  Lying on your back, slowly slide your foot toward your buttocks, raising your knee up off the floor. Then slowly slide your foot back down until your leg is straight again.  Lying on your back spread your legs as far apart as you can without causing  discomfort.  Lying on your side, raise your upper leg and foot straight up from the floor as far as is comfortable. Slowly lower the leg and repeat.  Lying on your back, tighten up the muscle in the front of your thigh (quadriceps muscles). You can do this by keeping your leg straight and trying to raise your heel off the floor. This helps strengthen the largest muscle supporting your knee.  Lying on your back, tighten up the muscles of your buttocks both with the legs straight and with the knee bent at a comfortable angle while keeping your heel on the floor.   POST-OPERATIVE OPIOID TAPER INSTRUCTIONS: It is important to wean off of your opioid medication as soon as possible. If you do not need pain medication after your surgery it is ok to stop day one. Opioids include: Codeine, Hydrocodone(Norco, Vicodin), Oxycodone(Percocet, oxycontin) and hydromorphone amongst others.  Long term and even short term use of opiods can cause: Increased pain response Dependence Constipation Depression Respiratory depression And more.  Withdrawal symptoms can include Flu like symptoms Nausea, vomiting And more Techniques to manage these symptoms Hydrate well Eat regular healthy meals Stay active Use relaxation techniques(deep breathing, meditating, yoga) Do Not substitute Alcohol to help with tapering If you have been on opioids for less than two weeks and do not have pain than it is ok to stop all together.  Plan to wean off of opioids This plan should start within one week post op of your joint replacement. Maintain the same interval or time between taking each dose and first decrease the dose.  Cut the total daily intake of opioids by one tablet each day Next start to increase the time between doses. The last dose that should be eliminated is the evening dose.   IF YOU ARE TRANSFERRED TO A SKILLED REHAB FACILITY If the patient is transferred to a skilled rehab facility following release from the  hospital, a list of the current medications will be sent to the facility for the patient to continue.  When discharged from the skilled rehab facility, please have the facility set up the patient's Home Health Physical Therapy prior to being released. Also, the skilled facility will be responsible for providing the patient with their medications at time of release from the facility to include their pain medication, the muscle relaxants, and their blood thinner medication. If the patient is still at the rehab facility at   time of the two week follow up appointment, the skilled rehab facility will also need to assist the patient in arranging follow up appointment in our office and any transportation needs.  MAKE SURE YOU:  Understand these instructions.  Get help right away if you are not doing well or get worse.   DENTAL ANTIBIOTICS:  In most cases prophylactic antibiotics for Dental procdeures after total joint surgery are not necessary.  Exceptions are as follows:  1. History of prior total joint infection  2. Severely immunocompromised (Organ Transplant, cancer chemotherapy, Rheumatoid biologic meds such as Humera)  3. Poorly controlled diabetes (A1C &gt; 8.0, blood glucose over 200)  If you have one of these conditions, contact your surgeon for an antibiotic prescription, prior to your dental procedure.    Pick up stool softner and laxative for home use following surgery while on pain medications. Do not submerge incision under water. Please use good hand washing techniques while changing dressing each day. May shower starting three days after surgery. Please use a clean towel to pat the incision dry following showers. Continue to use ice for pain and swelling after surgery. Do not use any lotions or creams on the incision until instructed by your surgeon.  Information on my medicine - XARELTO (Rivaroxaban)  This medication education was reviewed with me or my healthcare  representative as part of my discharge preparation.  The pharmacist that spoke with me during my hospital stay was:    Why was Xarelto prescribed for you? Xarelto was prescribed for you to reduce the risk of blood clots forming after orthopedic surgery. The medical term for these abnormal blood clots is venous thromboembolism (VTE).  What do you need to know about xarelto ? Take your Xarelto ONCE DAILY at the same time every day. You may take it either with or without food.  If you have difficulty swallowing the tablet whole, you may crush it and mix in applesauce just prior to taking your dose.  Take Xarelto exactly as prescribed by your doctor and DO NOT stop taking Xarelto without talking to the doctor who prescribed the medication.  Stopping without other VTE prevention medication to take the place of Xarelto may increase your risk of developing a clot.  After discharge, you should have regular check-up appointments with your healthcare provider that is prescribing your Xarelto.    What do you do if you miss a dose? If you miss a dose, take it as soon as you remember on the same day then continue your regularly scheduled once daily regimen the next day. Do not take two doses of Xarelto on the same day.   Important Safety Information A possible side effect of Xarelto is bleeding. You should call your healthcare provider right away if you experience any of the following: Bleeding from an injury or your nose that does not stop. Unusual colored urine (red or dark brown) or unusual colored stools (red or black). Unusual bruising for unknown reasons. A serious fall or if you hit your head (even if there is no bleeding).  Some medicines may interact with Xarelto and might increase your risk of bleeding while on Xarelto. To help avoid this, consult your healthcare provider or pharmacist prior to using any new prescription or non-prescription medications, including herbals, vitamins,  non-steroidal anti-inflammatory drugs (NSAIDs) and supplements.  This website has more information on Xarelto: www.xarelto.com.    

## 2021-12-21 NOTE — Op Note (Signed)
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   POSTOPERATIVE DIAGNOSIS: Osteoarthritis of the Left  hip.   PROCEDURE: Left total hip arthroplasty, anterior approach.   SURGEON: Gaynelle Arabian, MD   ASSISTANT: Theresa Duty, PA-C  ANESTHESIA:  Spinal  ESTIMATED BLOOD LOSS:-375 mL    DRAINS: Hemovac x1.   COMPLICATIONS: None   CONDITION: PACU - hemodynamically stable.   BRIEF CLINICAL NOTE: Elizabeth [name] is a 81 y.o. female who has advanced end-  stage arthritis of their Left  hip with progressively worsening pain and  dysfunction.The patient has failed nonoperative management and presents for  total hip arthroplasty.   PROCEDURE IN DETAIL: After successful administration of spinal  anesthetic, the traction boots for the [facility] bed were placed on both  feet and the patient was placed onto the [facility] bed, boots placed into the leg  holders. The Left hip was then isolated from the perineum with plastic  drapes and prepped and draped in the usual sterile fashion. ASIS and  greater trochanter were marked and a oblique incision was made, starting  at about 1 cm lateral and 2 cm distal to the ASIS and coursing towards  the anterior cortex of the femur. The skin was cut with a 10 blade  through subcutaneous tissue to the level of the fascia overlying the  tensor fascia lata muscle. The fascia was then incised in line with the  incision at the junction of the anterior third and posterior 2/3rd. The  muscle was teased off the fascia and then the interval between the TFL  and the rectus was developed. The Hohmann retractor was then placed at  the top of the femoral neck over the capsule. The vessels overlying the  capsule were cauterized and the fat on top of the capsule was removed.  A Hohmann retractor was then placed anterior underneath the rectus  femoris to give exposure to the entire anterior capsule. A  T-shaped  capsulotomy was performed. The edges were tagged and the femoral head  was identified.       Osteophytes are removed off the superior acetabulum.  The femoral neck was then cut in situ with an oscillating saw. Traction  was then applied to the left lower extremity utilizing the [facility]  traction. The femoral head was then removed. Retractors were placed  around the acetabulum and then circumferential removal of the labrum was  performed. Osteophytes were also removed. Reaming starts at 49 mm to  medialize and  Increased in 2 mm increments to 51 mm. We reamed in  approximately 40 degrees of abduction, 20 degrees anteversion. A 52 mm  pinnacle acetabular shell was then impacted in anatomic position under  fluoroscopic guidance with excellent purchase. We did not need to place  any additional dome screws. A 32 mm neutral + 4 marathon liner was then  placed into the acetabular shell.       The femoral lift was then placed along the lateral aspect of the femur  just distal to the vastus ridge. The leg was  externally rotated and capsule  was stripped off the inferior aspect of the femoral neck down to the  level of the lesser trochanter, this was done with electrocautery. The femur was lifted after this was performed. The  leg was then placed in an extended and adducted position essentially delivering the femur. We also removed the capsule superiorly and the piriformis from the  piriformis fossa to gain excellent exposure of the  proximal femur. Rongeur was used to remove some cancellous bone to get  into the lateral portion of the proximal femur for placement of the  initial starter reamer. The starter broaches was placed  the starter broach  and was shown to go down the center of the canal. Broaching  with the Actis system was then performed starting at size 0  coursing  Up to size 5. A size 5 had excellent torsional and rotational  and axial stability. The trial high offset neck was then  placed  with a 32 + 1 trial head. The hip was then reduced. We confirmed that  the stem was in the canal both on AP and lateral x-rays. It also has excellent sizing. The hip was reduced with outstanding stability through full extension and full external rotation.. AP pelvis was taken and the leg lengths were measured and found to be equal. Hip was then dislocated again and the femoral head and neck removed. The  femoral broach was removed. Size 5 Actis stem with a high offset  neck was then impacted into the femur following native anteversion. Has  excellent purchase in the canal. Excellent torsional and rotational and  axial stability. It is confirmed to be in the canal on AP and lateral  fluoroscopic views. The 32 + 1 ceramic head was placed and the hip  reduced with outstanding stability. Again AP pelvis was taken and it  confirmed that the leg lengths were equal. The wound was then copiously  irrigated with saline solution and the capsule reattached and repaired  with Ethibond suture. 30 ml of .25% Bupivicaine was  injected into the capsule and into the edge of the tensor fascia lata as well as subcutaneous tissue. The fascia overlying the tensor fascia lata was then closed with a running #1 V-Loc. Subcu was closed with interrupted 2-0 Vicryl and subcuticular running 4-0 Monocryl. Incision was cleaned  and dried. Steri-Strips and a bulky sterile dressing applied. The patient was awakened and transported to  recovery in stable condition.        Please note that a surgical assistant was a medical necessity for this procedure to perform it in a safe and expeditious manner. Assistant was necessary to provide appropriate retraction of vital neurovascular structures and to prevent femoral fracture and allow for anatomic placement of the prosthesis.  Gaynelle Arabian, M.D.

## 2021-12-21 NOTE — Anesthesia Postprocedure Evaluation (Signed)
Anesthesia Post Note  Patient: Elizabeth [name]  Procedure(s) Performed: TOTAL HIP ARTHROPLASTY ANTERIOR APPROACH (Left: Hip)     Patient location during evaluation: PACU Anesthesia Type: MAC and Spinal Level of consciousness: awake and alert Pain management: pain level controlled Vital Signs Assessment: post-procedure vital signs reviewed and stable Respiratory status: spontaneous breathing, nonlabored ventilation, respiratory function stable and patient connected to nasal cannula oxygen Cardiovascular status: stable and blood pressure returned to baseline Postop Assessment: no apparent nausea or vomiting Anesthetic complications: no   No notable events documented.  Last Vitals:  Vitals:   12/21/21 1247 12/21/21 1450  BP: 122/69 128/73  Pulse: 86 87  Resp: 16 16  Temp: 36.4 C (!) 36.4 C  SpO2: 98% 100%    Last Pain:  Vitals:   12/21/21 1450  TempSrc: Oral  PainSc:                  [name]

## 2021-12-21 NOTE — Transfer of Care (Signed)
Immediate Anesthesia Transfer of Care Note  Patient: Elizabeth [name]  Procedure(s) Performed: TOTAL HIP ARTHROPLASTY ANTERIOR APPROACH (Left: Hip)  Patient Location: PACU  Anesthesia Type:Spinal  Level of Consciousness: awake, alert  and oriented  Airway & Oxygen Therapy: Patient Spontanous Breathing and Patient connected to face mask oxygen  Post-op Assessment: Report given to RN and Post -op Vital signs reviewed and stable  Post vital signs: Reviewed and stable  Last Vitals:  Vitals Value Taken Time  BP 107/62 12/21/21 0937  Temp    Pulse 90 12/21/21 0937  Resp 14 12/21/21 0937  SpO2 100 % 12/21/21 0937  Vitals shown include unvalidated device data.  Last Pain:  Vitals:   12/21/21 0704  TempSrc: Oral  PainSc:       Patients Stated Pain Goal: 3 (16/10/96 0454)  Complications: No notable events documented.

## 2021-12-21 NOTE — Anesthesia Procedure Notes (Signed)
Date/Time: 12/21/2021 8:06 AM Performed by: Sharlette Dense, CRNA Oxygen Delivery Method: Simple face mask

## 2021-12-21 NOTE — Interval H&P Note (Signed)
History and Physical Interval Note:  12/21/2021 6:25 AM  Elizabeth [name]  has presented today for surgery, with the diagnosis of LEFT hip osteoarthritis.  The various methods of treatment have been discussed with the patient and family. After consideration of risks, benefits and other options for treatment, the patient has consented to  Procedure(s): TOTAL HIP ARTHROPLASTY ANTERIOR APPROACH (Left) as a surgical intervention.  The patient's history has been reviewed, patient examined, no change in status, stable for surgery.  I have reviewed the patient's chart and labs.  Questions were answered to the patient's satisfaction.     Pilar Plate Vandy Fong

## 2021-12-21 NOTE — Plan of Care (Signed)
  Problem: Clinical Measurements: Goal: Ability to maintain clinical measurements within normal limits will improve Outcome: Progressing   Problem: Activity: Goal: Risk for activity intolerance will decrease Outcome: Progressing   Problem: Pain Managment: Goal: General experience of comfort will improve Outcome: Progressing   

## 2021-12-22 ENCOUNTER — Encounter (HOSPITAL_COMMUNITY): Payer: Self-pay | Admitting: Orthopedic Surgery

## 2021-12-22 DIAGNOSIS — M1612 Unilateral primary osteoarthritis, left hip: Secondary | ICD-10-CM | POA: Diagnosis not present

## 2021-12-22 DIAGNOSIS — Z20822 Contact with and (suspected) exposure to covid-19: Secondary | ICD-10-CM | POA: Diagnosis not present

## 2021-12-22 DIAGNOSIS — Z87891 Personal history of nicotine dependence: Secondary | ICD-10-CM | POA: Diagnosis not present

## 2021-12-22 DIAGNOSIS — Z79899 Other long term (current) drug therapy: Secondary | ICD-10-CM | POA: Diagnosis not present

## 2021-12-22 DIAGNOSIS — Z853 Personal history of malignant neoplasm of breast: Secondary | ICD-10-CM | POA: Diagnosis not present

## 2021-12-22 LAB — BASIC METABOLIC PANEL
Anion gap: 6 (ref 5–15)
BUN: 9 mg/dL (ref 8–23)
CO2: 27 mmol/L (ref 22–32)
Calcium: 8.8 mg/dL — ABNORMAL LOW (ref 8.9–10.3)
Chloride: 99 mmol/L (ref 98–111)
Creatinine, Ser: 0.67 mg/dL (ref 0.44–1.00)
GFR, Estimated: >60 mL/min (ref >60)
Glucose, Bld: 104 mg/dL — ABNORMAL HIGH (ref 70–99)
Potassium: 4.6 mmol/L (ref 3.5–5.1)
Sodium: 132 mmol/L — ABNORMAL LOW (ref 135–145)

## 2021-12-22 LAB — CBC
HCT: 26.1 % — ABNORMAL LOW (ref 36.0–46.0)
Hemoglobin: 8.8 g/dL — ABNORMAL LOW (ref 12.0–15.0)
MCH: 33.5 pg (ref 26.0–34.0)
MCHC: 33.7 g/dL (ref 30.0–36.0)
MCV: 99.2 fL (ref 80.0–100.0)
Platelets: 194 10*3/uL (ref 150–400)
RBC: 2.63 MIL/uL — ABNORMAL LOW (ref 3.87–5.11)
RDW: 11.9 % (ref 11.5–15.5)
WBC: 8.3 10*3/uL (ref 4.0–10.5)
nRBC: 0 % (ref 0.0–0.2)

## 2021-12-22 LAB — RESP PANEL BY RT-PCR (FLU A&B, COVID) ARPGX2
Influenza A by PCR: NEGATIVE
Influenza B by PCR: NEGATIVE
SARS Coronavirus 2 by RT PCR: NEGATIVE

## 2021-12-22 MED ORDER — HYDROCODONE-ACETAMINOPHEN 5-325 MG PO TABS: 1.0000 | ORAL_TABLET | Freq: Four times a day (QID) | ORAL | 0 refills | Status: DC | PRN

## 2021-12-22 MED ORDER — TRAMADOL HCL 50 MG PO TABS: 50.0000 mg | ORAL_TABLET | Freq: Four times a day (QID) | ORAL | 0 refills | Status: DC | PRN

## 2021-12-22 MED ORDER — RIVAROXABAN 10 MG PO TABS: 10.0000 mg | ORAL_TABLET | Freq: Every day | ORAL | 0 refills | Status: AC

## 2021-12-22 MED ORDER — ONDANSETRON HCL 4 MG PO TABS: 4.0000 mg | ORAL_TABLET | Freq: Four times a day (QID) | ORAL | 0 refills | Status: DC | PRN

## 2021-12-22 MED ORDER — SODIUM CHLORIDE 0.9 % IV BOLUS
250.0000 mL | Freq: Once | INTRAVENOUS | Status: AC
  Administered 2021-12-22: 250 mL via INTRAVENOUS

## 2021-12-22 MED ORDER — METHOCARBAMOL 500 MG PO TABS: 500.0000 mg | ORAL_TABLET | Freq: Four times a day (QID) | ORAL | 0 refills | Status: DC | PRN

## 2021-12-22 NOTE — TOC Transition Note (Signed)
Transition of Care [facility]) - CM/SW Discharge Note   Patient Details  Name: Elizabeth [name] MRN: [id number] Date of Birth: 1941/08/16  [facility] CM/SW Contact:  Lennart Pall, LCSW Phone Number: 12/22/2021, 11:24 AM   Clinical Narrative:    Pt medically cleared for dc today and reports plan is to go to rehab unit at [facility].  Confirmed with Katy Fitch, LCSW at [facility] and they are ready for pt to admit today.  Pt notes spouse will be providing dc transport.  RN to call [phone] for report.  No further TOC needs.   Final next level of care: Skilled Nursing Facility Barriers to Discharge: No Barriers Identified   Patient Goals and CMS Choice Patient states their goals for this hospitalization and ongoing recovery are:: rehab      Discharge Placement              Patient chooses bed at: [facility] Patient to be transferred to facility by: spouse -private vehicle Name of family member notified: spouse    Discharge Plan and Services                DME Arranged: N/A DME Agency: NA                  Social Determinants of Health ([place]) Interventions     Readmission Risk Interventions No flowsheet data found.

## 2021-12-22 NOTE — Progress Notes (Signed)
° °  Subjective: 1 Day Post-Op Procedure(s) (LRB): TOTAL HIP ARTHROPLASTY ANTERIOR APPROACH (Left) Patient reports pain as mild.   Patient seen in rounds by Dr. Wynelle Link. Patient is well, and has had no acute complaints or problems other than soreness in the left hip. No issues overnight. Denies chest pain or SOB. Foley catheter removed this AM. We will continue therapy today.   Objective: Vital signs in last 24 hours: Temp:  [97.5 F (36.4 C)-98.8 F (37.1 C)] 98 F (36.7 C) (02/02 0640) Pulse Rate:  [72-99] 92 (02/02 0640) Resp:  [12-18] 17 (02/02 0640) BP: (102-134)/(56-83) 102/56 (02/02 0640) SpO2:  [92 %-100 %] 92 % (02/02 0640)  Intake/Output from previous day:  Intake/Output Summary (Last 24 hours) at 12/22/2021 0742 Last data filed at 12/22/2021 0600 Gross per 24 hour  Intake 4212.31 ml  Output 4250 ml  Net -37.69 ml     Intake/Output this shift: No intake/output data recorded.  Labs: Recent Labs    12/22/21 0318  HGB 8.8*   Recent Labs    12/22/21 0318  WBC 8.3  RBC 2.63*  HCT 26.1*  PLT 194   Recent Labs    12/22/21 0318  NA 132*  K 4.6  CL 99  CO2 27  BUN 9  CREATININE 0.67  GLUCOSE 104*  CALCIUM 8.8*   No results for input(s): LABPT, INR in the last 72 hours.  Exam: General - Patient is Alert and Oriented Extremity - Neurologically intact Neurovascular intact Sensation intact distally Dorsiflexion/Plantar flexion intact Dressing - dressing C/D/I Motor Function - intact, moving foot and toes well on exam.   Past Medical History:  Diagnosis Date   Arthritis    Cancer ([place])    pre cancer    Chicken pox    GERD (gastroesophageal reflux disease)    History of radiation therapy 06/26/17- 07/24/17   Left Breast 50.05 Gy total   Personal history of radiation therapy    Shingles 07/2002   Sleep apnea    mild uses oral devise   Urinary tract infection     Assessment/Plan: 1 Day Post-Op Procedure(s) (LRB): TOTAL HIP ARTHROPLASTY ANTERIOR  APPROACH (Left) Principal Problem:   OA (osteoarthritis) of hip Active Problems:   S/P total left hip arthroplasty  Estimated body mass index is 22.6 kg/m as calculated from the following:   Height as of this encounter: 5\' 6"  (1.676 m).   Weight as of this encounter: 63.5 kg. Advance diet Up with therapy D/C IV fluids  DVT Prophylaxis - Xarelto Weight bearing as tolerated. Continue therapy.  BP soft this AM with I/O in the negatives, 250 mL bolus ordered.  Plan is to go to Skilled nursing facility after hospital stay. Will consult social work for patient to return to SNF portion of [facility].  Can discharge today if possible, otherwise will go tomorrow. Follow-up in the office in 2 weeks.  The PDMP database was reviewed today prior to any opioid medications being prescribed to this patient.  Theresa Duty, PA-C Orthopedic Surgery [phone] 12/22/2021, 7:42 AM

## 2021-12-22 NOTE — Progress Notes (Addendum)
Physical Therapy Treatment Patient Details Name: Elizabeth [name] MRN: [id number] DOB: [birth year] Today's Date: 12/22/2021   History of Present Illness 81 y.o. female admitted 12/21/21 for L AA-THA. PMH includes breast cancer, sleep apnea, spinal fusion.    PT Comments    Pt is progressing well with mobility, she ambulated 25' with RW and performed THA HEP with min assist. From a PT standpoint, she is ready to DC. Pt plans to go to rehab unit at [facility]. She is safe to transport by car with family to [employer]    Recommendations for follow up therapy are one component of a multi-disciplinary discharge planning process, led by the attending physician.  Recommendations may be updated based on patient status, additional functional criteria and insurance authorization.  Follow Up Recommendations  Follow physician's recommendations for discharge plan and follow up therapies     Assistance Recommended at Discharge Intermittent Supervision/Assistance  Patient can return home with the following A little help with bathing/dressing/bathroom;Assist for transportation;Help with stairs or ramp for entrance   Equipment Recommendations  Rolling walker (2 wheels)    Recommendations for Other Services       Precautions / Restrictions Precautions Precautions: Fall Restrictions Weight Bearing Restrictions: No Other Position/Activity Restrictions: WBAT     Mobility  Bed Mobility Overal bed mobility: Needs Assistance Bed Mobility: Supine to Sit     Supine to sit: Min assist, HOB elevated     General bed mobility comments: assist to support LLE and pivot hips to EOB, used rail    Transfers Overall transfer level: Needs assistance Equipment used: Rolling walker (2 wheels) Transfers: Sit to/from Stand Sit to Stand: Min guard           General transfer comment: VCs hand placement    Ambulation/Gait Ambulation/Gait assistance: Min guard Gait Distance (Feet): 80  Feet Assistive device: Rolling walker (2 wheels) Gait Pattern/deviations: Step-to pattern, Decreased step length - right, Decreased step length - left Gait velocity: decr     General Gait Details: VCs sequencing, no loss of balance   Stairs             Wheelchair Mobility    Modified Rankin (Stroke Patients Only)       Balance Overall balance assessment: Modified Independent                                          Cognition Arousal/Alertness: Awake/alert Behavior During Therapy: WFL for tasks assessed/performed Overall Cognitive Status: Within Functional Limits for tasks assessed                                          Exercises Total Joint Exercises Ankle Circles/Pumps: AROM, Both, 10 reps, Supine Quad Sets: AROM, Left, 5 reps, Supine Short Arc Quad: AROM, Left, 5 reps, Supine Heel Slides: AAROM, Left, 5 reps, Supine Hip ABduction/ADduction: AAROM, Left, 5 reps, Supine Long Arc Quad: AROM, Left, 5 reps, Seated    General Comments        Pertinent Vitals/Pain Pain Assessment Pain Score: 4  Pain Location: L hip with activity Pain Descriptors / Indicators: Sore Pain Intervention(s): Limited activity within patient's tolerance, Monitored during session, Premedicated before session, Ice applied    Home Living  Prior Function            PT Goals (current goals can now be found in the care plan section) Acute Rehab PT Goals Patient Stated Goal: to walk without pain, pilates PT Goal Formulation: With patient/family Time For Goal Achievement: 12/28/21 Potential to Achieve Goals: Good Progress towards PT goals: Progressing toward goals    Frequency    7X/week      PT Plan Current plan remains appropriate    Co-evaluation              AM-PAC PT "6 Clicks" Mobility   Outcome Measure  Help needed turning from your back to your side while in a flat bed without using  bedrails?: A Little Help needed moving from lying on your back to sitting on the side of a flat bed without using bedrails?: A Little Help needed moving to and from a bed to a chair (including a wheelchair)?: A Little Help needed standing up from a chair using your arms (e.g., wheelchair or bedside chair)?: A Little Help needed to walk in hospital room?: A Little Help needed climbing 3-5 steps with a railing? : A Little 6 Click Score: 18    End of Session Equipment Utilized During Treatment: Gait belt Activity Tolerance: Patient tolerated treatment well Patient left: in chair;with call bell/phone within reach;with family/visitor present Nurse Communication: Mobility status PT Visit Diagnosis: Difficulty in walking, not elsewhere classified (R26.2);Pain Pain - Right/Left: Left Pain - part of body: Hip     Time: 7829-5621 PT Time Calculation (min) (ACUTE ONLY): 22 min  Charges:  $Gait Training: 8-22 mins                    Blondell Reveal Kistler PT 12/22/2021  Acute Rehabilitation Services Pager [phone] Office [phone]

## 2021-12-22 NOTE — NC FL2 (Signed)
[place] LEVEL OF CARE SCREENING TOOL     IDENTIFICATION  Patient Name: Elizabeth [name] Birthdate: 05-Nov-1941 Sex: female Admission Date (Current Location): 12/21/2021  [facility] and [place] Number:  Herbalist and Address:  [facility],  [place] [address] [place]      Provider Number: [id number]  Attending Physician Name and Address:  Gaynelle Arabian, MD  Relative Name and Phone Number:       Current Level of Care: Hospital Recommended Level of Care: [place] Prior Approval Number:    Date Approved/Denied:   PASRR Number: [id number]  Discharge Plan: SNF    Current Diagnoses: Patient Active Problem List   Diagnosis Date Noted   OA (osteoarthritis) of hip 12/21/2021   S/P total left hip arthroplasty 12/21/2021   Hx of breast cancer 08/12/2021   Pain of left hip joint 01/24/2021   Dysphagia 11/02/2020   Diarrhea 11/02/2020   Sensorineural hearing loss (SNHL) of both ears 06/24/2019   Apnea, sleep 11/03/2016   Lumbar back pain 12/23/2015   Routine general medical examination at a health care facility 10/08/2013   Chronic back pain 12/26/2012   Blood glucose elevated 12/26/2012    Orientation RESPIRATION BLADDER Height & Weight     Self, Time, Situation, Place  Normal Continent Weight: 140 lb (63.5 kg) Height:  5\' 6"  (167.6 cm)  BEHAVIORAL SYMPTOMS/MOOD NEUROLOGICAL BOWEL NUTRITION STATUS      Continent    AMBULATORY STATUS COMMUNICATION OF NEEDS Skin   Limited Assist Verbally Other (Comment) (surgical incision only)                       Personal Care Assistance Level of Assistance  Bathing, Dressing Bathing Assistance: Limited assistance   Dressing Assistance: Limited assistance     Functional Limitations Info             [place]  PT (By licensed PT), OT (By licensed OT)     PT Frequency: 5x/wk OT Frequency: 5x/wk            Contractures  Contractures Info: Not present    Additional Factors Info  Code Status, Allergies Code Status Info: Full Allergies Info: NKDA           Current Medications (12/22/2021):  This is the current hospital active medication list Current Facility-Administered Medications  Medication Dose Route Frequency Provider Last Rate Last Admin   0.9 %  sodium chloride infusion   Intravenous Continuous Jonnie Kind, PA-C 75 mL/hr at 12/22/21 0531 New Bag at 12/22/21 0531   acetaminophen (TYLENOL) tablet 325-650 mg  325-650 mg Oral Q6H PRN Fenton Foy D, PA-C       bisacodyl (DULCOLAX) suppository 10 mg  10 mg Rectal Daily PRN Roda Shutters, Sean D, PA-C       Chlorhexidine Gluconate Cloth 2 % PADS 6 each  6 each Topical Daily Gaynelle Arabian, MD   6 each at 12/21/21 2312   docusate sodium (COLACE) capsule 100 mg  100 mg Oral BID Fenton Foy D, PA-C   100 mg at 12/22/21 4782   gabapentin (NEURONTIN) capsule 300 mg  300 mg Oral QID Fenton Foy D, PA-C   300 mg at 12/22/21 9562   HYDROcodone-acetaminophen ([place]) 7.5-325 MG per tablet 1-2 tablet  1-2 tablet Oral Q4H PRN Fenton Foy D, PA-C       HYDROcodone-acetaminophen (NORCO/VICODIN) 5-325 MG per tablet 1-2 tablet  1-2  tablet Oral Q4H PRN Jonnie Kind, PA-C   1 tablet at 12/22/21 2025   menthol-cetylpyridinium (CEPACOL) lozenge 3 mg  1 lozenge Oral PRN Fenton Foy D, PA-C       Or   phenol (CHLORASEPTIC) mouth spray 1 spray  1 spray Mouth/Throat PRN Fenton Foy D, PA-C       methocarbamol (ROBAXIN) tablet 500 mg  500 mg Oral Q6H PRN Fenton Foy D, PA-C   500 mg at 12/22/21 4270   Or   methocarbamol (ROBAXIN) 500 mg in dextrose 5 % 50 mL IVPB  500 mg Intravenous Q6H PRN Fenton Foy D, PA-C       metoCLOPramide (REGLAN) tablet 5-10 mg  5-10 mg Oral Q8H PRN Fenton Foy D, PA-C       Or   metoCLOPramide (REGLAN) injection 5-10 mg  5-10 mg Intravenous Q8H PRN Roda Shutters, Sean D, PA-C       montelukast (SINGULAIR) tablet  10 mg  10 mg Oral QHS Roda Shutters, Sean D, PA-C   10 mg at 12/21/21 2245   morphine (PF) 2 MG/ML injection 0.5-1 mg  0.5-1 mg Intravenous Q2H PRN Fenton Foy D, PA-C   1 mg at 12/22/21 0123   ondansetron (ZOFRAN) tablet 4 mg  4 mg Oral Q6H PRN Fenton Foy D, PA-C       Or   ondansetron [facility]) injection 4 mg  4 mg Intravenous Q6H PRN Fenton Foy D, PA-C       pantoprazole (PROTONIX) EC tablet 40 mg  40 mg Oral Daily Fenton Foy D, PA-C   40 mg at 12/22/21 0803   polyethylene glycol (MIRALAX / GLYCOLAX) packet 17 g  17 g Oral Daily PRN Fenton Foy D, PA-C       rivaroxaban [facility]) tablet 10 mg  10 mg Oral Q breakfast Fenton Foy D, PA-C   10 mg at 12/22/21 6237   traMADol (ULTRAM) tablet 50-100 mg  50-100 mg Oral Q6H PRN Jonnie Kind, PA-C   100 mg at 12/21/21 1137     Discharge Medications: Please see discharge summary for a list of discharge medications.  Relevant Imaging Results:  Relevant Lab Results:   Additional Information SS# [id number]  Lennart Pall, LCSW

## 2021-12-22 NOTE — Discharge Summary (Signed)
Physician Discharge Summary   Patient ID: Elizabeth [name] MRN: [id number] DOB/AGE: [birth year] 79 y.o.  Admit date: 12/21/2021 Discharge date:   Primary Diagnosis: Osteoarthritis, left hip   Admission Diagnoses:  Past Medical History:  Diagnosis Date   Arthritis    Cancer ([place])    pre cancer    Chicken pox    GERD (gastroesophageal reflux disease)    History of radiation therapy 06/26/17- 07/24/17   Left Breast 50.05 Gy total   Personal history of radiation therapy    Shingles 07/2002   Sleep apnea    mild uses oral devise   Urinary tract infection    Discharge Diagnoses:   Principal Problem:   OA (osteoarthritis) of hip Active Problems:   S/P total left hip arthroplasty  Estimated body mass index is 22.6 kg/m as calculated from the following:   Height as of this encounter: 5\' 6"  (1.676 m).   Weight as of this encounter: 63.5 kg.  Procedure:  Procedure(s) (LRB): TOTAL HIP ARTHROPLASTY ANTERIOR APPROACH (Left)   Consults: None  HPI: Elizabeth [name] is a 81 y.o. female who has advanced end-  stage arthritis of their Left  hip with progressively worsening pain and  dysfunction.The patient has failed nonoperative management and presents for  total hip arthroplasty.   Laboratory Data: Admission on 12/21/2021  Component Date Value Ref Range Status   ABO/RH(D) 12/21/2021    Final                   Value:O POS Performed at [facility], [place] [address] [place], [place] [address]    WBC 12/22/2021 8.3  4.0 - 10.5 K/uL Final   RBC 12/22/2021 2.63 (L)  3.87 - 5.11 MIL/uL Final   Hemoglobin 12/22/2021 8.8 (L)  12.0 - 15.0 g/dL Final   HCT 12/22/2021 26.1 (L)  36.0 - 46.0 % Final   MCV 12/22/2021 99.2  80.0 - 100.0 fL Final   MCH 12/22/2021 33.5  26.0 - 34.0 pg Final   MCHC 12/22/2021 33.7  30.0 - 36.0 g/dL Final   RDW 12/22/2021 11.9  11.5 - 15.5 % Final   Platelets 12/22/2021 194  150 - 400 K/uL Final   nRBC 12/22/2021 0.0  0.0 - 0.2  % Final   Performed at [facility], [place] [address] [place], [place] [address]   Sodium 12/22/2021 132 (L)  135 - 145 mmol/L Final   Potassium 12/22/2021 4.6  3.5 - 5.1 mmol/L Final   Chloride 12/22/2021 99  98 - 111 mmol/L Final   CO2 12/22/2021 27  22 - 32 mmol/L Final   Glucose, Bld 12/22/2021 104 (H)  70 - 99 mg/dL Final   Glucose reference range applies only to samples taken after fasting for at least 8 hours.   BUN 12/22/2021 9  8 - 23 mg/dL Final   Creatinine, Ser 12/22/2021 0.67  0.44 - 1.00 mg/dL Final   Calcium 12/22/2021 8.8 (L)  8.9 - 10.3 mg/dL Final   GFR, Estimated 12/22/2021 >60  >60 mL/min Final   Comment: (NOTE) Calculated using the CKD-EPI Creatinine Equation (2021)    Anion gap 12/22/2021 6  5 - 15 Final   Performed at [facility], [place] [address], [place], [place] [address]  Hospital Outpatient Visit on 12/19/2021  Component Date Value Ref Range Status   SARS Coronavirus 2 12/19/2021 NEGATIVE  NEGATIVE Final   Comment: (NOTE) SARS-CoV-2 target nucleic acids are NOT  DETECTED.  The SARS-CoV-2 RNA is generally detectable in upper and lower respiratory specimens during the acute phase of infection. Negative results do not preclude SARS-CoV-2 infection, do not rule out co-infections with other pathogens, and should not be used as the sole basis for treatment or other patient management decisions. Negative results must be combined with clinical observations, patient history, and epidemiological information. The expected result is Negative.  Fact Sheet for Patients: SugarRoll.be  Fact Sheet for Healthcare Providers: https://www.woods-mathews.com/  This test is not yet approved or cleared by the [place] FDA and  has been authorized for detection and/or diagnosis of SARS-CoV-2 by FDA under an Emergency Use Authorization (EUA). This EUA will remain  in effect (meaning this test can  be used) for the duration of the COVID-19 declaration under Se                          ction 564(b)(1) of the Act, 21 U.S.C. section 360bbb-3(b)(1), unless the authorization is terminated or revoked sooner.  Performed at [facility], [place] [address] [place], [place] [address]   Hospital Outpatient Visit on 12/08/2021  Component Date Value Ref Range Status   MRSA, PCR 12/08/2021 NEGATIVE  NEGATIVE Final   Staphylococcus aureus 12/08/2021 NEGATIVE  NEGATIVE Final   Comment: (NOTE) The Xpert SA Assay (FDA approved for NASAL specimens in patients 65 years of age and older), is one component of a comprehensive surveillance program. It is not intended to diagnose infection nor to guide or monitor treatment. Performed at [facility], [place] [address], [place], [place] [address]    WBC 12/08/2021 4.7  4.0 - 10.5 K/uL Final   RBC 12/08/2021 3.46 (L)  3.87 - 5.11 MIL/uL Final   Hemoglobin 12/08/2021 11.6 (L)  12.0 - 15.0 g/dL Final   HCT 12/08/2021 33.9 (L)  36.0 - 46.0 % Final   MCV 12/08/2021 98.0  80.0 - 100.0 fL Final   MCH 12/08/2021 33.5  26.0 - 34.0 pg Final   MCHC 12/08/2021 34.2  30.0 - 36.0 g/dL Final   RDW 12/08/2021 11.7  11.5 - 15.5 % Final   Platelets 12/08/2021 257  150 - 400 K/uL Final   nRBC 12/08/2021 0.0  0.0 - 0.2 % Final   Performed at [facility], [place] [address] [place], [place] [address]   Sodium 12/08/2021 129 (L)  135 - 145 mmol/L Final   Potassium 12/08/2021 4.4  3.5 - 5.1 mmol/L Final   Chloride 12/08/2021 97 (L)  98 - 111 mmol/L Final   CO2 12/08/2021 24  22 - 32 mmol/L Final   Glucose, Bld 12/08/2021 98  70 - 99 mg/dL Final   Glucose reference range applies only to samples taken after fasting for at least 8 hours.   BUN 12/08/2021 10  8 - 23 mg/dL Final   Creatinine, Ser 12/08/2021 0.99  0.44 - 1.00 mg/dL Final   Calcium 12/08/2021 9.6  8.9 - 10.3 mg/dL Final   Total Protein 12/08/2021 7.1  6.5 - 8.1 g/dL Final    Albumin 12/08/2021 4.2  3.5 - 5.0 g/dL Final   AST 12/08/2021 24  15 - 41 U/L Final   ALT 12/08/2021 22  0 - 44 U/L Final   Alkaline Phosphatase 12/08/2021 56  38 - 126 U/L Final   Total Bilirubin 12/08/2021 0.6  0.3 - 1.2 mg/dL Final   GFR, Estimated 12/08/2021 58 (L)  >60 mL/min Final  Comment: (NOTE) Calculated using the CKD-EPI Creatinine Equation (2021)    Anion gap 12/08/2021 8  5 - 15 Final   Performed at [facility], [place] [address], [place], [place] [address]   ABO/RH(D) 12/08/2021 O POS   Final   Antibody Screen 12/08/2021 NEG   Final   Sample Expiration 12/08/2021 12/22/2021,2359   Final   Extend sample reason 12/08/2021    Final                   Value:NO TRANSFUSIONS OR PREGNANCY IN THE PAST 3 MONTHS Performed at [place] [address] [place], [place] [address]      X-Rays:DG Pelvis Portable  Result Date: 12/21/2021 CLINICAL DATA:  Status post hip replacement EXAM: PORTABLE PELVIS 1-2 VIEWS COMPARISON:  Intraoperative radiographs obtained earlier the same day FINDINGS: Postsurgical changes reflecting left hip arthroplasty are seen. Alignment is within expected limits, without evidence of complication. There is expected surrounding postoperative soft tissue gas. There are moderate to severe degenerative changes about the right hip. Screws are seen transfixing the bilateral SI joints. There is faint lucency around the screws which may reflect loosening. IMPRESSION: 1. Status post left hip arthroplasty without evidence of complication. 2. 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 3. Bilateral SI joint screws with faint perihardware lucency which could reflect loosening. Electronically Signed   By: Valetta Mole M.D.   On: 12/21/2021 10:12   DG C-Arm 1-60 Min-No Report  Result Date: 12/21/2021 CLINICAL DATA:  Left total hip replacement. EXAM: OPERATIVE LEFT HIP (WITH PELVIS IF PERFORMED) 9 VIEWS TECHNIQUE:  Fluoroscopic spot image(s) were submitted for interpretation post-operatively. COMPARISON:  None. FINDINGS: Nine spot intraoperative fluoroscopic images of the left hip and lower pelvis are provided for review and demonstrate the sequela of left total hip replacement. Alignment appears anatomic given AP projection. No definite fracture. Note is made of pre-existing left-sided SI joint fusion as well as suspected operative repair of the lower lumbar spine, incompletely evaluated. Subcutaneous emphysema is noted about the operative site. A radiopaque sponge is overlying the operative site on several images though is not seen on the completion radiographs. No definite radiopaque foreign body. Limited visualization of the contralateral right hip suggests at least moderate degenerative change, incompletely evaluated. IMPRESSION: Post left total hip replacement without evidence of complication. Electronically Signed   By: Sandi Mariscal M.D.   On: 12/21/2021 09:40   DG HIP OPERATIVE UNILAT WITH PELVIS LEFT  Result Date: 12/21/2021 CLINICAL DATA:  Left total hip replacement. EXAM: OPERATIVE LEFT HIP (WITH PELVIS IF PERFORMED) 9 VIEWS TECHNIQUE: Fluoroscopic spot image(s) were submitted for interpretation post-operatively. COMPARISON:  None. FINDINGS: Nine spot intraoperative fluoroscopic images of the left hip and lower pelvis are provided for review and demonstrate the sequela of left total hip replacement. Alignment appears anatomic given AP projection. No definite fracture. Note is made of pre-existing left-sided SI joint fusion as well as suspected operative repair of the lower lumbar spine, incompletely evaluated. Subcutaneous emphysema is noted about the operative site. A radiopaque sponge is overlying the operative site on several images though is not seen on the completion radiographs. No definite radiopaque foreign body. Limited visualization of the contralateral right hip suggests at least moderate degenerative  change, incompletely evaluated. IMPRESSION: Post left total hip replacement without evidence of complication. Electronically Signed   By: Sandi Mariscal M.D.   On: 12/21/2021 09:40    EKG: Orders placed or performed in visit  on 10/08/13   EKG 12-Lead     Hospital Course: Elizabeth [name] is a 81 y.o. who was admitted to [facility]. They were brought to the operating room on 12/21/2021 and underwent Procedure(s): [place].  Patient tolerated the procedure well and was later transferred to the recovery room and then to the orthopaedic floor for postoperative care. They were given PO and IV analgesics for pain control following their surgery. They were given 24 hours of postoperative antibiotics of  Anti-infectives (From admission, onward)    Start     Dose/Rate Route Frequency Ordered Stop   12/21/21 1400  ceFAZolin (ANCEF) IVPB 2g/100 mL premix        2 g 200 mL/hr over 30 Minutes Intravenous Every 6 hours 12/21/21 1006 12/21/21 2129   12/21/21 0630  ceFAZolin (ANCEF) IVPB 2g/100 mL premix        2 g 200 mL/hr over 30 Minutes Intravenous On call to O.R. 12/21/21 0086 12/21/21 0818      and started on DVT prophylaxis in the form of Xarelto.   PT and OT were ordered for total joint protocol. Discharge planning consulted to help with postop disposition and equipment needs.  Patient had a good night on the evening of surgery. They started to get up OOB with therapy on POD #0. Pt was seen during rounds and was ready for discharge pending bed arrangement. She worked with therapy on POD #1 and was meeting her goals. Pt was discharged to [facility] later that day.  Diet: Regular diet Activity: WBAT Follow-up: in 2 weeks Disposition: Skilled nursing facility Discharged Condition: stable   Discharge Instructions     Call MD / Call 911   Complete by: As directed    If you experience chest pain or shortness of breath, CALL 911 and be transported to  the hospital emergency room.  If you develope a fever above 101 F, pus (white drainage) or increased drainage or redness at the wound, or calf pain, call your surgeon's office.   Change dressing   Complete by: As directed    You have an adhesive waterproof bandage over the incision. Leave this in place until your first follow-up appointment. Once you remove this you will not need to place another bandage.   Constipation Prevention   Complete by: As directed    Drink plenty of fluids.  Prune juice may be helpful.  You may use a stool softener, such as Colace (over the counter) 100 mg twice a day.  Use MiraLax (over the counter) for constipation as needed.   Diet - low sodium heart healthy   Complete by: As directed    Do not sit on low chairs, stoools or toilet seats, as it may be difficult to get up from low surfaces   Complete by: As directed    Driving restrictions   Complete by: As directed    No driving for two weeks   Post-operative opioid taper instructions:   Complete by: As directed    POST-OPERATIVE OPIOID TAPER INSTRUCTIONS: It is important to wean off of your opioid medication as soon as possible. If you do not need pain medication after your surgery it is ok to stop day one. Opioids include: Codeine, Hydrocodone(Norco, Vicodin), Oxycodone(Percocet, oxycontin) and hydromorphone amongst others.  Long term and even short term use of opiods can cause: Increased pain response Dependence Constipation Depression Respiratory depression And more.  Withdrawal symptoms can include Flu like  symptoms Nausea, vomiting And more Techniques to manage these symptoms Hydrate well Eat regular healthy meals Stay active Use relaxation techniques(deep breathing, meditating, yoga) Do Not substitute Alcohol to help with tapering If you have been on opioids for less than two weeks and do not have pain than it is ok to stop all together.  Plan to wean off of opioids This plan should start  within one week post op of your joint replacement. Maintain the same interval or time between taking each dose and first decrease the dose.  Cut the total daily intake of opioids by one tablet each day Next start to increase the time between doses. The last dose that should be eliminated is the evening dose.      TED hose   Complete by: As directed    Use stockings (TED hose) for three weeks on both leg(s).  You may remove them at night for sleeping.   Weight bearing as tolerated   Complete by: As directed       Allergies as of 12/22/2021   No Known Allergies      Medication List     STOP taking these medications    Biotin 5000 MCG Tabs   CENTRUM SILVER 50+WOMEN PO   CRANBERRY PO   ibuprofen 200 MG tablet Commonly known as: ADVIL   sulfamethoxazole-trimethoprim 800-160 MG tablet Commonly known as: BACTRIM DS       TAKE these medications    acetaminophen 500 MG tablet Commonly known as: TYLENOL Take 1,000 mg by mouth in the morning and at bedtime.   Advanced Probiotic 10 Caps Take 1 capsule by mouth daily.   cholecalciferol 25 MCG (1000 UNIT) tablet Commonly known as: VITAMIN D3 Take 1,000 Units by mouth daily.   Citrucel 500 MG Tabs Generic drug: Methylcellulose (Laxative) Take 2,000 mg by mouth daily.   estradiol 0.1 MG/GM vaginal cream Commonly known as: ESTRACE Place vaginally 3 (three) times a week.   gabapentin 300 MG capsule Commonly known as: NEURONTIN TAKE 1 CAPSULE BY MOUTH FOUR TIMES A DAY   HYDROcodone-acetaminophen 5-325 MG tablet Commonly known as: NORCO/VICODIN Take 1-2 tablets by mouth every 6 (six) hours as needed for moderate pain or severe pain.   methocarbamol 500 MG tablet Commonly known as: ROBAXIN Take 1 tablet (500 mg total) by mouth every 6 (six) hours as needed for muscle spasms.   montelukast 10 MG tablet Commonly known as: SINGULAIR Take 1 tablet by mouth at bedtime.   mupirocin ointment 2 % Commonly known as:  BACTROBAN Apply 1 application topically 2 (two) times daily.   omeprazole 20 MG capsule Commonly known as: PRILOSEC Take 1 capsule (20 mg total) by mouth daily.   ondansetron 4 MG tablet Commonly known as: ZOFRAN Take 1 tablet (4 mg total) by mouth every 6 (six) hours as needed for nausea.   rivaroxaban 10 MG Tabs tablet Commonly known as: XARELTO Take 1 tablet (10 mg total) by mouth daily with breakfast for 20 days. Then take one 81 mg aspirin once a day for three weeks. Then discontinue aspirin.   traMADol 50 MG tablet Commonly known as: ULTRAM Take 1-2 tablets (50-100 mg total) by mouth every 6 (six) hours as needed for moderate pain.               Discharge Care Instructions  (From admission, onward)           Start     Ordered   12/22/21 0000  Weight bearing  as tolerated        12/22/21 0747   12/22/21 0000  Change dressing       Comments: You have an adhesive waterproof bandage over the incision. Leave this in place until your first follow-up appointment. Once you remove this you will not need to place another bandage.   12/22/21 0747            Follow-up Information     Gaynelle Arabian, MD Follow up in 2 week(s).   Specialty: Orthopedic Surgery Contact information: [address] [place] [address] [phone]                 Signed: Theresa Duty, PA-C Orthopedic Surgery 12/22/2021, 7:47 AM

## 2021-12-23 DIAGNOSIS — R2689 Other abnormalities of gait and mobility: Secondary | ICD-10-CM | POA: Diagnosis not present

## 2021-12-23 DIAGNOSIS — R278 Other lack of coordination: Secondary | ICD-10-CM | POA: Diagnosis not present

## 2021-12-23 DIAGNOSIS — M160 Bilateral primary osteoarthritis of hip: Secondary | ICD-10-CM | POA: Diagnosis not present

## 2021-12-23 DIAGNOSIS — R2681 Unsteadiness on feet: Secondary | ICD-10-CM | POA: Diagnosis not present

## 2021-12-23 DIAGNOSIS — M62562 Muscle wasting and atrophy, not elsewhere classified, left lower leg: Secondary | ICD-10-CM | POA: Diagnosis not present

## 2021-12-23 DIAGNOSIS — Z96642 Presence of left artificial hip joint: Secondary | ICD-10-CM | POA: Diagnosis not present

## 2021-12-23 DIAGNOSIS — M6389 Disorders of muscle in diseases classified elsewhere, multiple sites: Secondary | ICD-10-CM | POA: Diagnosis not present

## 2021-12-26 ENCOUNTER — Non-Acute Institutional Stay (SKILLED_NURSING_FACILITY): Payer: Medicare HMO | Admitting: Internal Medicine

## 2021-12-26 ENCOUNTER — Encounter: Payer: Self-pay | Admitting: Internal Medicine

## 2021-12-26 DIAGNOSIS — D649 Anemia, unspecified: Secondary | ICD-10-CM

## 2021-12-26 DIAGNOSIS — R278 Other lack of coordination: Secondary | ICD-10-CM | POA: Diagnosis not present

## 2021-12-26 DIAGNOSIS — Z96642 Presence of left artificial hip joint: Secondary | ICD-10-CM | POA: Diagnosis not present

## 2021-12-26 DIAGNOSIS — K5900 Constipation, unspecified: Secondary | ICD-10-CM

## 2021-12-26 DIAGNOSIS — M160 Bilateral primary osteoarthritis of hip: Secondary | ICD-10-CM | POA: Diagnosis not present

## 2021-12-26 DIAGNOSIS — R2681 Unsteadiness on feet: Secondary | ICD-10-CM | POA: Diagnosis not present

## 2021-12-26 DIAGNOSIS — R2689 Other abnormalities of gait and mobility: Secondary | ICD-10-CM | POA: Diagnosis not present

## 2021-12-26 DIAGNOSIS — M62562 Muscle wasting and atrophy, not elsewhere classified, left lower leg: Secondary | ICD-10-CM | POA: Diagnosis not present

## 2021-12-26 DIAGNOSIS — I10 Essential (primary) hypertension: Secondary | ICD-10-CM | POA: Diagnosis not present

## 2021-12-26 DIAGNOSIS — M6389 Disorders of muscle in diseases classified elsewhere, multiple sites: Secondary | ICD-10-CM | POA: Diagnosis not present

## 2021-12-26 LAB — CBC AND DIFFERENTIAL
HCT: 26 — AB (ref 36–46)
Hemoglobin: 9.1 — AB (ref 12.0–16.0)
Platelets: 324 (ref 150–399)
WBC: 4.9

## 2021-12-26 LAB — CBC: RBC: 2.65 — AB (ref 3.87–5.11)

## 2021-12-26 NOTE — Progress Notes (Addendum)
Location:   [facility] Room Number: 151 Place of Service:  SNF [phone]) Provider:  Veleta Miners MD  Midge Minium, MD  Patient Care Team: Midge Minium, MD as PCP - General (Family Medicine) Paula Compton, MD as Consulting Physician (Obstetrics and Gynecology) Gloris Manchester, MD (Neurosurgery) Regal, Tamala Fothergill, DPM as Consulting Physician (Podiatry) Marica Otter, [place] (Optometry) Jovita Kussmaul, MD as Consulting Physician (General Surgery) Nicholas Lose, MD as Consulting Physician (Hematology and Oncology) Eppie Gibson, MD as Attending Physician (Radiation Oncology) Gardenia Phlegm, NP as Nurse Practitioner (Hematology and Oncology) Irene Shipper, MD as Consulting Physician (Gastroenterology)  Extended Emergency Contact Information Primary Emergency Contact: [facility] M Address: [address] [place], [place] [address] [place] Phone: [phone] Mobile Phone: [phone] Relation: Spouse Secondary Emergency Contact: Larose Kells, [place] Phone: [phone] Mobile Phone: [phone] Relation: Daughter  Code Status:  Full Code Goals of care: Advanced Directive information Advanced Directives 12/26/2021  Does Patient Have a Medical Advance Directive? Yes  Type of Paramedic of [place];Living will  Does patient want to make changes to medical advance directive? No - Patient declined  Copy of [place] in Chart? Yes - validated most recent copy scanned in chart (See row information)     Chief Complaint  Patient presents with   New Admit To SNF    Admission to SNF    HPI:  Pt is a 81 y.o. female seen today for medical management of chronic diseases.    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 Maureen Ralphs on 02/01 Her postop course was uneventful uneventful She was discharged to SNF for therapy Already doing very well pain seems to be controlled walking with her walker able to do everything for herself .  Did not have any acute complaints today Past Medical History:  Diagnosis Date   Arthritis    Cancer ([place])    pre cancer    Chicken pox    GERD (gastroesophageal reflux disease)    History of radiation therapy 06/26/17- 07/24/17   Left Breast 50.05 Gy total   Personal history of radiation therapy    Shingles 07/2002   Sleep apnea    mild uses oral devise   Urinary tract infection    Past Surgical History:  Procedure Laterality Date   BREAST BIOPSY Bilateral 03/07/2017   benign   BREAST BIOPSY Right 03/26/2017   benign   BREAST BIOPSY Left 03/26/2017   malignant   BREAST LUMPECTOMY Left 05/03/2017   BREAST LUMPECTOMY WITH RADIOACTIVE SEED LOCALIZATION Left 05/03/2017   Procedure: LEFT BREAST LUMPECTOMY WITH RADIOACTIVE SEED LOCALIZATION;  Surgeon: Jovita Kussmaul, MD;  Location: [facility];  Service: General;  Laterality: Left;   COLONOSCOPY     HYSTEROSCOPY     Dr Ubaldo Glassing   SPINAL FUSION  12/01/2016   T10 to Pelvis   TOTAL HIP ARTHROPLASTY Left 12/21/2021   Procedure: TOTAL HIP ARTHROPLASTY ANTERIOR APPROACH;  Surgeon: Gaynelle Arabian, MD;  Location: [facility];  Service: Orthopedics;  Laterality: Left;  UPPER GASTROINTESTINAL ENDOSCOPY  11/30/2020   WISDOM TOOTH EXTRACTION     age 33's    No Known Allergies  Allergies as of 12/26/2021   No Known Allergies      Medication List        Accurate as of December 26, 2021  9:52 AM. If you have any questions, ask your nurse or doctor.          acetaminophen 500 MG tablet Commonly known as: TYLENOL Take 1,000 mg by mouth  in the morning and at bedtime.   acetaminophen 325 MG tablet Commonly known as: TYLENOL Take 650 mg by mouth every 4 (four) hours as needed.   Advanced Probiotic 10 Caps Take 1 capsule by mouth daily.   aspirin EC 81 MG tablet Take 81 mg by mouth daily. Swallow whole. Start taking on: January 12, 2022   bisacodyl 10 MG suppository Commonly known as: DULCOLAX Place 10 mg rectally as needed for moderate constipation.   cholecalciferol 25 MCG (1000 UNIT) tablet Commonly known as: VITAMIN D3 Take 1,000 Units by mouth daily.   Citrucel 500 MG Tabs Generic drug: Methylcellulose (Laxative) Take 2,000 mg by mouth daily.   docusate sodium 100 MG capsule Commonly known as: COLACE Take 100 mg by mouth 2 (two) times daily.   estradiol 0.1 MG/GM vaginal cream Commonly known as: ESTRACE Place vaginally 3 (three) times a week.   gabapentin 300 MG capsule Commonly known as: NEURONTIN TAKE 1 CAPSULE BY MOUTH FOUR TIMES A DAY   HYDROcodone-acetaminophen 5-325 MG tablet Commonly known as: NORCO/VICODIN Take 1-2 tablets by mouth every 6 (six) hours as needed for moderate pain or severe pain.   methocarbamol 500 MG tablet Commonly known as: ROBAXIN Take 1 tablet (500 mg total) by mouth every 6 (six) hours as needed for muscle spasms.   montelukast 10 MG tablet Commonly known as: SINGULAIR Take 1 tablet by mouth at bedtime.   mupirocin ointment 2 % Commonly known as: BACTROBAN Apply 1 application topically 2 (two) times daily.   omeprazole 20 MG capsule Commonly known as: PRILOSEC Take 1 capsule (20 mg total) by mouth daily.   ondansetron 4 MG tablet Commonly known as: ZOFRAN Take 1 tablet (4 mg total) by mouth every 6 (six) hours as needed for nausea.   polyethylene glycol 17 g packet Commonly known as: MIRALAX / GLYCOLAX Take 17 g by mouth daily as needed.   rivaroxaban 10 MG Tabs tablet Commonly known as: XARELTO Take 1 tablet (10 mg total) by mouth daily with  breakfast for 20 days. Then take one 81 mg aspirin once a day for three weeks. Then discontinue aspirin.   traMADol 50 MG tablet Commonly known as: ULTRAM Take 1-2 tablets (50-100 mg total) by mouth every 6 (six) hours as needed for moderate pain.        Review of Systems  Constitutional:  Negative for activity change and appetite change.  HENT: Negative.    Respiratory:  Negative for cough and shortness of breath.   Cardiovascular:  Negative for leg swelling.  Gastrointestinal:  Negative for constipation.  Genitourinary: Negative.   Musculoskeletal:  Negative for arthralgias, gait problem and myalgias.  Skin: Negative.   Neurological:  Negative for dizziness and weakness.  Psychiatric/Behavioral:  Negative for confusion, dysphoric mood and sleep disturbance.    Immunization History  Administered Date(s) Administered   Fluad Quad(high Dose 65+) 08/06/2019, 08/09/2020, 08/12/2021   Influenza, High Dose Seasonal PF 09/12/2013, 09/24/2015, 08/24/2016, 08/08/2018   Influenza,inj,Quad  PF,6+ Mos 08/24/2014, 07/30/2017   Influenza-Unspecified 09/12/2013   PFIZER Comirnaty(Gray Top)Covid-19 Tri-Sucrose Vaccine 04/11/2021   PFIZER(Purple Top)SARS-COV-2 Vaccination 12/04/2019, 12/25/2019   Pfizer Covid-19 Vaccine Bivalent Booster 1yrs & up 11/03/2021   Pneumococcal Conjugate-13 07/22/2008, 12/17/2014   Pneumococcal Polysaccharide-23 10/06/2016   Tetanus 10/08/2013   Zoster Recombinat (Shingrix) 11/07/2018, 05/21/2019   Zoster, Live 07/22/2008   Pertinent  Health Maintenance Due  Topic Date Due   INFLUENZA VACCINE  Completed   DEXA SCAN  Completed   Fall Risk 09/02/2021 11/23/2021 12/21/2021 12/21/2021 12/21/2021  Falls in the past year? 0 0 - - -  Was there an injury with Fall? 0 0 - - -  Fall Risk Category Calculator 0 0 - - -  Fall Risk Category Low Low - - -  Patient Fall Risk Level Low fall risk Low fall risk Low fall risk High fall risk High fall risk  Patient at Risk for Falls  Due to No Fall Risks No Fall Risks - - -  Fall risk Follow up Falls evaluation completed Falls evaluation completed - - -   Functional Status Survey:    Vitals:   12/26/21 0929  BP: 118/76  Pulse: 91  Resp: 20  Temp: 97.7 F (36.5 C)  SpO2: 97%  Weight: 140 lb (63.5 kg)  Height: 5\' 6"  (1.676 m)   Body mass index is 22.6 kg/m. Physical Exam Vitals reviewed.  Constitutional:      Appearance: Normal appearance.  HENT:     Head: Normocephalic.     Nose: Nose normal.     Mouth/Throat:     Mouth: Mucous membranes are moist.     Pharynx: Oropharynx is clear.  Eyes:     Pupils: Pupils are equal, round, and reactive to light.  Cardiovascular:     Rate and Rhythm: Normal rate and regular rhythm.     Pulses: Normal pulses.     Heart sounds: Normal heart sounds. No murmur heard. Pulmonary:     Effort: Pulmonary effort is normal.     Breath sounds: Normal breath sounds.  Abdominal:     General: Abdomen is flat. Bowel sounds are normal.     Palpations: Abdomen is soft.  Musculoskeletal:        General: No swelling.     Cervical back: Neck supple.  Skin:    General: Skin is warm.  Neurological:     General: No focal deficit present.     Mental Status: She is alert and oriented to person, place, and time.  Psychiatric:        Mood and Affect: Mood normal.        Thought Content: Thought content normal.    Labs reviewed: Recent Labs    08/12/21 1008 12/08/21 1458 12/22/21 0318  NA 132* 129* 132*  K 4.9 4.4 4.6  CL 97 97* 99  CO2 28 24 27   GLUCOSE 95 98 104*  BUN 17 10 9   CREATININE 0.75 0.99 0.67  CALCIUM 10.4 9.6 8.8*   Recent Labs    08/12/21 1008 12/08/21 1458  AST 19 24  ALT 21 22  ALKPHOS 56 56  BILITOT 0.7 0.6  PROT 7.6 7.1  ALBUMIN 4.9 4.2   Recent Labs    08/12/21 1008 12/08/21 1458 12/22/21 0318  WBC 9.8 4.7 8.3  NEUTROABS 8.7*  --   --   HGB 12.6 11.6* 8.8*  HCT 37.4 33.9* 26.1*  MCV 98.3 98.0 99.2  PLT 326.0 257 194  Lab Results   Component Value Date   TSH 2.61 08/12/2021   Lab Results  Component Value Date   HGBA1C 4.1 10/09/2013   Lab Results  Component Value Date   CHOL 220 (H) 08/12/2021   HDL 106.80 08/12/2021   LDLCALC 90 08/12/2021   TRIG 116.0 08/12/2021   CHOLHDL 2 08/12/2021    Significant Diagnostic Results in last 30 days:  DG Pelvis Portable  Result Date: 12/21/2021 CLINICAL DATA:  Status post hip replacement EXAM: PORTABLE PELVIS 1-2 VIEWS COMPARISON:  Intraoperative radiographs obtained earlier the same day FINDINGS: Postsurgical changes reflecting left hip arthroplasty are seen. Alignment is within expected limits, without evidence of complication. There is expected surrounding postoperative soft tissue gas. There are moderate to severe degenerative changes about the right hip. Screws are seen transfixing the bilateral SI joints. There is faint lucency around the screws which may reflect loosening. IMPRESSION: 1. Status post left hip arthroplasty without evidence of complication. 2. 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 3. Bilateral SI joint screws with faint perihardware lucency which could reflect loosening. Electronically Signed   By: Valetta Mole M.D.   On: 12/21/2021 10:12   DG C-Arm 1-60 Min-No Report  Result Date: 12/21/2021 CLINICAL DATA:  Left total hip replacement. EXAM: OPERATIVE LEFT HIP (WITH PELVIS IF PERFORMED) 9 VIEWS TECHNIQUE: Fluoroscopic spot image(s) were submitted for interpretation post-operatively. COMPARISON:  None. FINDINGS: Nine spot intraoperative fluoroscopic images of the left hip and lower pelvis are provided for review and demonstrate the sequela of left total hip replacement. Alignment appears anatomic given AP projection. No definite fracture. Note is made of pre-existing left-sided SI joint fusion as well as suspected operative repair of the lower lumbar spine, incompletely evaluated. Subcutaneous emphysema is noted about the operative site. A  radiopaque sponge is overlying the operative site on several images though is not seen on the completion radiographs. No definite radiopaque foreign body. Limited visualization of the contralateral right hip suggests at least moderate degenerative change, incompletely evaluated. IMPRESSION: Post left total hip replacement without evidence of complication. Electronically Signed   By: Sandi Mariscal M.D.   On: 12/21/2021 09:40   DG HIP OPERATIVE UNILAT WITH PELVIS LEFT  Result Date: 12/21/2021 CLINICAL DATA:  Left total hip replacement. EXAM: OPERATIVE LEFT HIP (WITH PELVIS IF PERFORMED) 9 VIEWS TECHNIQUE: Fluoroscopic spot image(s) were submitted for interpretation post-operatively. COMPARISON:  None. FINDINGS: Nine spot intraoperative fluoroscopic images of the left hip and lower pelvis are provided for review and demonstrate the sequela of left total hip replacement. Alignment appears anatomic given AP projection. No definite fracture. Note is made of pre-existing left-sided SI joint fusion as well as suspected operative repair of the lower lumbar spine, incompletely evaluated. Subcutaneous emphysema is noted about the operative site. A radiopaque sponge is overlying the operative site on several images though is not seen on the completion radiographs. No definite radiopaque foreign body. Limited visualization of the contralateral right hip suggests at least moderate degenerative change, incompletely evaluated. IMPRESSION: Post left total hip replacement without evidence of complication. Electronically Signed   By: Sandi Mariscal M.D.   On: 12/21/2021 09:40    Assessment/Plan S/P total left hip arthroplasty Pain controlled On Tramadol Norco and Tylenol Walking with no assist On Low dose xarelto  Anemia, unspecified type Post op Repeat CBC  Constipation, unspecified constipation type Miralax   Family/ staff Communication:   Labs/tests ordered:  CBC

## 2021-12-26 NOTE — Discharge Summary (Signed)
Patient ID: Elizabeth [name] MRN: [id number] DOB/AGE: 79/07/1941 79 y.o.  Admit date: 12/21/2021 Discharge date: 12/22/2021  Admission Diagnoses:  Principal Problem:   OA (osteoarthritis) of hip Active Problems:   S/P total left hip arthroplasty   Discharge Diagnoses:  Same  Past Medical History:  Diagnosis Date   Arthritis    Cancer ([place])    pre cancer    Chicken pox    GERD (gastroesophageal reflux disease)    History of radiation therapy 06/26/17- 07/24/17   Left Breast 50.05 Gy total   Personal history of radiation therapy    Shingles 07/2002   Sleep apnea    mild uses oral devise   Urinary tract infection     Surgeries: Procedure(s): TOTAL HIP ARTHROPLASTY ANTERIOR APPROACH on 12/21/2021   Consultants:   Discharged Condition: Improved  Hospital Course: Elizabeth [name] is an 81 y.o. female who was admitted 12/21/2021 for operative treatment ofOA (osteoarthritis) of hip. Patient has severe unremitting pain that affects sleep, daily activities, and work/hobbies. After pre-op clearance the patient was taken to the operating room on 12/21/2021 and underwent  Procedure(s): TOTAL HIP ARTHROPLASTY ANTERIOR APPROACH.    Patient was given perioperative antibiotics:  Anti-infectives (From admission, onward)    Start     Dose/Rate Route Frequency Ordered Stop   12/21/21 1400  ceFAZolin (ANCEF) IVPB 2g/100 mL premix        2 g 200 mL/hr over 30 Minutes Intravenous Every 6 hours 12/21/21 1006 12/21/21 2129   12/21/21 0630  ceFAZolin (ANCEF) IVPB 2g/100 mL premix        2 g 200 mL/hr over 30 Minutes Intravenous On call to O.R. 12/21/21 1224 12/21/21 0818        Patient was given sequential compression devices, early ambulation, and chemoprophylaxis to prevent DVT.  Patient benefited maximally from hospital stay and there were no complications.    Recent vital signs: No data found.   Recent laboratory studies: No results for input(s): WBC, HGB, HCT, PLT, NA, K,  CL, CO2, BUN, CREATININE, GLUCOSE, INR, CALCIUM in the last 72 hours.  Invalid input(s): PT, 2   Discharge Medications:   Allergies as of 12/22/2021   No Known Allergies      Medication List     STOP taking these medications    Biotin 5000 MCG Tabs   CENTRUM SILVER 50+WOMEN PO   CRANBERRY PO   ibuprofen 200 MG tablet Commonly known as: ADVIL   sulfamethoxazole-trimethoprim 800-160 MG tablet Commonly known as: BACTRIM DS       TAKE these medications    acetaminophen 500 MG tablet Commonly known as: TYLENOL Take 1,000 mg by mouth in the morning and at bedtime.   Advanced Probiotic 10 Caps Take 1 capsule by mouth daily.   cholecalciferol 25 MCG (1000 UNIT) tablet Commonly known as: VITAMIN D3 Take 1,000 Units by mouth daily.   Citrucel 500 MG Tabs Generic drug: Methylcellulose (Laxative) Take 2,000 mg by mouth daily.   estradiol 0.1 MG/GM vaginal cream Commonly known as: ESTRACE Place vaginally 3 (three) times a week.   gabapentin 300 MG capsule Commonly known as: NEURONTIN TAKE 1 CAPSULE BY MOUTH FOUR TIMES A DAY   HYDROcodone-acetaminophen 5-325 MG tablet Commonly known as: NORCO/VICODIN Take 1-2 tablets by mouth every 6 (six) hours as needed for moderate pain or severe pain.   methocarbamol 500 MG tablet Commonly known as: ROBAXIN Take 1 tablet (500 mg total) by mouth every 6 (six) hours as  needed for muscle spasms.   montelukast 10 MG tablet Commonly known as: SINGULAIR Take 1 tablet by mouth at bedtime.   mupirocin ointment 2 % Commonly known as: BACTROBAN Apply 1 application topically 2 (two) times daily.   omeprazole 20 MG capsule Commonly known as: PRILOSEC Take 1 capsule (20 mg total) by mouth daily.   ondansetron 4 MG tablet Commonly known as: ZOFRAN Take 1 tablet (4 mg total) by mouth every 6 (six) hours as needed for nausea.   rivaroxaban 10 MG Tabs tablet Commonly known as: XARELTO Take 1 tablet (10 mg total) by mouth daily  with breakfast for 20 days. Then take one 81 mg aspirin once a day for three weeks. Then discontinue aspirin.   traMADol 50 MG tablet Commonly known as: ULTRAM Take 1-2 tablets (50-100 mg total) by mouth every 6 (six) hours as needed for moderate pain.               Discharge Care Instructions  (From admission, onward)           Start     Ordered   12/22/21 0000  Weight bearing as tolerated        12/22/21 0747   12/22/21 0000  Change dressing       Comments: You have an adhesive waterproof bandage over the incision. Leave this in place until your first follow-up appointment. Once you remove this you will not need to place another bandage.   12/22/21 0747            Diagnostic Studies: DG Pelvis Portable  Result Date: 12/21/2021 CLINICAL DATA:  Status post hip replacement EXAM: PORTABLE PELVIS 1-2 VIEWS COMPARISON:  Intraoperative radiographs obtained earlier the same day FINDINGS: Postsurgical changes reflecting left hip arthroplasty are seen. Alignment is within expected limits, without evidence of complication. There is expected surrounding postoperative soft tissue gas. There are moderate to severe degenerative changes about the right hip. Screws are seen transfixing the bilateral SI joints. There is faint lucency around the screws which may reflect loosening. IMPRESSION: 1. Status post left hip arthroplasty without evidence of complication. 2. 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 3. Bilateral SI joint screws with faint perihardware lucency which could reflect loosening. Electronically Signed   By: Valetta Mole M.D.   On: 12/21/2021 10:12   DG C-Arm 1-60 Min-No Report  Result Date: 12/21/2021 CLINICAL DATA:  Left total hip replacement. EXAM: OPERATIVE LEFT HIP (WITH PELVIS IF PERFORMED) 9 VIEWS TECHNIQUE: Fluoroscopic spot image(s) were submitted for interpretation post-operatively. COMPARISON:  None. FINDINGS: Nine spot intraoperative fluoroscopic  images of the left hip and lower pelvis are provided for review and demonstrate the sequela of left total hip replacement. Alignment appears anatomic given AP projection. No definite fracture. Note is made of pre-existing left-sided SI joint fusion as well as suspected operative repair of the lower lumbar spine, incompletely evaluated. Subcutaneous emphysema is noted about the operative site. A radiopaque sponge is overlying the operative site on several images though is not seen on the completion radiographs. No definite radiopaque foreign body. Limited visualization of the contralateral right hip suggests at least moderate degenerative change, incompletely evaluated. IMPRESSION: Post left total hip replacement without evidence of complication. Electronically Signed   By: Sandi Mariscal M.D.   On: 12/21/2021 09:40   DG HIP OPERATIVE UNILAT WITH PELVIS LEFT  Result Date: 12/21/2021 CLINICAL DATA:  Left total hip replacement. EXAM: OPERATIVE LEFT HIP (WITH PELVIS IF PERFORMED) 9 VIEWS TECHNIQUE:  Fluoroscopic spot image(s) were submitted for interpretation post-operatively. COMPARISON:  None. FINDINGS: Nine spot intraoperative fluoroscopic images of the left hip and lower pelvis are provided for review and demonstrate the sequela of left total hip replacement. Alignment appears anatomic given AP projection. No definite fracture. Note is made of pre-existing left-sided SI joint fusion as well as suspected operative repair of the lower lumbar spine, incompletely evaluated. Subcutaneous emphysema is noted about the operative site. A radiopaque sponge is overlying the operative site on several images though is not seen on the completion radiographs. No definite radiopaque foreign body. Limited visualization of the contralateral right hip suggests at least moderate degenerative change, incompletely evaluated. IMPRESSION: Post left total hip replacement without evidence of complication. Electronically Signed   By: Sandi Mariscal M.D.   On: 12/21/2021 09:40    Disposition: Discharge disposition: 03-Skilled Nursing Facility       Discharge Instructions     Call MD / Call 911   Complete by: As directed    If you experience chest pain or shortness of breath, CALL 911 and be transported to the hospital emergency room.  If you develope a fever above 101 F, pus (white drainage) or increased drainage or redness at the wound, or calf pain, call your surgeon's office.   Change dressing   Complete by: As directed    You have an adhesive waterproof bandage over the incision. Leave this in place until your first follow-up appointment. Once you remove this you will not need to place another bandage.   Constipation Prevention   Complete by: As directed    Drink plenty of fluids.  Prune juice may be helpful.  You may use a stool softener, such as Colace (over the counter) 100 mg twice a day.  Use MiraLax (over the counter) for constipation as needed.   Diet - low sodium heart healthy   Complete by: As directed    Do not sit on low chairs, stoools or toilet seats, as it may be difficult to get up from low surfaces   Complete by: As directed    Driving restrictions   Complete by: As directed    No driving for two weeks   Post-operative opioid taper instructions:   Complete by: As directed    POST-OPERATIVE OPIOID TAPER INSTRUCTIONS: It is important to wean off of your opioid medication as soon as possible. If you do not need pain medication after your surgery it is ok to stop day one. Opioids include: Codeine, Hydrocodone(Norco, Vicodin), Oxycodone(Percocet, oxycontin) and hydromorphone amongst others.  Long term and even short term use of opiods can cause: Increased pain response Dependence Constipation Depression Respiratory depression And more.  Withdrawal symptoms can include Flu like symptoms Nausea, vomiting And more Techniques to manage these symptoms Hydrate well Eat regular healthy meals Stay  active Use relaxation techniques(deep breathing, meditating, yoga) Do Not substitute Alcohol to help with tapering If you have been on opioids for less than two weeks and do not have pain than it is ok to stop all together.  Plan to wean off of opioids This plan should start within one week post op of your joint replacement. Maintain the same interval or time between taking each dose and first decrease the dose.  Cut the total daily intake of opioids by one tablet each day Next start to increase the time between doses. The last dose that should be eliminated is the evening dose.      TED hose  Complete by: As directed    Use stockings (TED hose) for three weeks on both leg(s).  You may remove them at night for sleeping.   Weight bearing as tolerated   Complete by: As directed         Contact information for follow-up providers     Gaynelle Arabian, MD Follow up in 2 week(s).   Specialty: Orthopedic Surgery Contact information: [address] [place] [address] [phone]              Contact information for after-discharge care     Destination     [facility] .   Service: Skilled Nursing Contact information: [place] [phone]                      Signed: Theresa Duty 12/26/2021, 7:43 AM

## 2021-12-27 ENCOUNTER — Encounter: Payer: Self-pay | Admitting: Internal Medicine

## 2021-12-27 ENCOUNTER — Non-Acute Institutional Stay (SKILLED_NURSING_FACILITY): Payer: Medicare HMO | Admitting: Orthopedic Surgery

## 2021-12-27 ENCOUNTER — Encounter: Payer: Self-pay | Admitting: Orthopedic Surgery

## 2021-12-27 DIAGNOSIS — R278 Other lack of coordination: Secondary | ICD-10-CM | POA: Diagnosis not present

## 2021-12-27 DIAGNOSIS — K5903 Drug induced constipation: Secondary | ICD-10-CM

## 2021-12-27 DIAGNOSIS — M160 Bilateral primary osteoarthritis of hip: Secondary | ICD-10-CM | POA: Diagnosis not present

## 2021-12-27 DIAGNOSIS — R2241 Localized swelling, mass and lump, right lower limb: Secondary | ICD-10-CM | POA: Diagnosis not present

## 2021-12-27 DIAGNOSIS — R2689 Other abnormalities of gait and mobility: Secondary | ICD-10-CM | POA: Diagnosis not present

## 2021-12-27 DIAGNOSIS — Z96642 Presence of left artificial hip joint: Secondary | ICD-10-CM

## 2021-12-27 DIAGNOSIS — M6389 Disorders of muscle in diseases classified elsewhere, multiple sites: Secondary | ICD-10-CM | POA: Diagnosis not present

## 2021-12-27 NOTE — Progress Notes (Signed)
Location:  [facility] Room Number: 151/A Place of Service:  SNF [phone]) Provider:  Yvonna Alanis, NP  Patient Care Team: Midge Minium, MD as PCP - General (Family Medicine) Paula Compton, MD as Consulting Physician (Obstetrics and Gynecology) Gloris Manchester, MD (Neurosurgery) Regal, Tamala Fothergill, DPM as Consulting Physician (Podiatry) Marica Otter, [place] (Optometry) Jovita Kussmaul, MD as Consulting Physician (General Surgery) Nicholas Lose, MD as Consulting Physician (Hematology and Oncology) Eppie Gibson, MD as Attending Physician (Radiation Oncology) Gardenia Phlegm, NP as Nurse Practitioner (Hematology and Oncology) Irene Shipper, MD as Consulting Physician (Gastroenterology)  Extended Emergency Contact Information Primary Emergency Contact: [facility] M Address: [address] [place], [place] [address] [place] Phone: [phone] Mobile Phone: [phone] Relation: Spouse Secondary Emergency Contact: Larose K[place], [place] Phone: [phone] Mobile Phone: [phone] Relation: Daughter  Code Status:  Full code Goals of care: Advanced Directive information Advanced Directives 12/27/2021  Does Patient Have a Medical Advance Directive? Yes  Type of Paramedic of [place];Living will  Does patient want to make changes to medical advance directive? No - Patient declined  Copy of [place] in Chart? Yes - validated most recent copy scanned in chart (See row information)     Chief Complaint  Patient presents with   Acute Visit    Acute visit for right hip lesion.    HPI:  Pt is a 81 y.o. Elizabeth [name] seen today for an acute visit for right hip lesion.   She noticed a tender lesion to right upper hip 12/26. At first she believed this was a reaction to using [place] pain patches. She tried applying triple  antibiotic ointment and warm compresses without success. She ended up seeing PCP 01/04, diagnosed with abscess, prescribed mupirocin ointment and doxycycline x 10 days. 01/20 she for follow up and area showed improvement.   Today, she reports feeling a hard nodule in the same area. Denies pain. She continues to apply mupirocin. She expresses concerns about it preventing hip replacement to right side in the near future. Treatment options discussed. She is scheduled to see Dr. Wynelle Link 02/14 for follow up. She would like his opinion. She also plans to see PCP after rehab discharge.   She continues to work with PT. Ambulating with walker > 270ft. Pain controlled with norco and robaxin. LBM 02/06. Taking xarelto for dvt prophylaxis. Denies chest pain, sob and calf pain.    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    Chicken pox    GERD (gastroesophageal reflux disease)    History of radiation therapy 06/26/17- 07/24/17   Left Breast 50.05 Gy total   Personal history of radiation therapy    Shingles 07/2002   Sleep apnea    mild uses oral devise   Urinary tract infection    Past Surgical History:  Procedure Laterality Date   BREAST BIOPSY Bilateral 03/07/2017   benign   BREAST BIOPSY Right 03/26/2017   benign   BREAST BIOPSY Left 03/26/2017   malignant   BREAST LUMPECTOMY Left 05/03/2017   BREAST LUMPECTOMY WITH RADIOACTIVE SEED LOCALIZATION Left 05/03/2017   Procedure: LEFT BREAST LUMPECTOMY WITH RADIOACTIVE SEED LOCALIZATION;  Surgeon: Jovita Kussmaul, MD;  Location: [facility];  Service: General;  Laterality:  Left;   COLONOSCOPY     HYSTEROSCOPY     Dr Ubaldo Glassing   SPINAL FUSION  12/01/2016   T10 to Pelvis   TOTAL HIP ARTHROPLASTY Left 12/21/2021   Procedure: TOTAL HIP ARTHROPLASTY ANTERIOR APPROACH;  Surgeon: Gaynelle Arabian, MD;  Location: [facility];  Service: Orthopedics;  Laterality: Left;   UPPER GASTROINTESTINAL ENDOSCOPY  11/30/2020   WISDOM TOOTH  EXTRACTION     age 67's    No Known Allergies  Outpatient Encounter Medications as of 12/27/2021  Medication Sig   acetaminophen (TYLENOL) 500 MG tablet Take 1,000 mg by mouth in the morning and at bedtime.   cholecalciferol (VITAMIN D3) 25 MCG (1000 UNIT) tablet Take 1,000 Units by mouth daily.   docusate sodium (COLACE) 100 MG capsule Take 100 mg by mouth 2 (two) times daily.   estradiol (ESTRACE) 0.1 MG/GM vaginal cream Place vaginally 3 (three) times a week.   gabapentin (NEURONTIN) 300 MG capsule TAKE 1 CAPSULE BY MOUTH FOUR TIMES A DAY   HYDROcodone-acetaminophen (NORCO/VICODIN) 5-325 MG tablet Take 1-2 tablets by mouth every 6 (six) hours as needed for moderate pain or severe pain.   methocarbamol (ROBAXIN) 500 MG tablet Take 1 tablet (500 mg total) by mouth every 6 (six) hours as needed for muscle spasms.   Methylcellulose, Laxative, (CITRUCEL) 500 MG TABS Take 2,000 mg by mouth daily.   montelukast (SINGULAIR) 10 MG tablet Take 1 tablet by mouth at bedtime.   mupirocin ointment (BACTROBAN) 2 % Apply 1 application topically 2 (two) times daily.   omeprazole (PRILOSEC) 20 MG capsule Take 1 capsule (20 mg total) by mouth daily.   ondansetron (ZOFRAN) 4 MG tablet Take 1 tablet (4 mg total) by mouth every 6 (six) hours as needed for nausea.   polyethylene glycol (MIRALAX / GLYCOLAX) 17 g packet Take 17 g by mouth daily as needed. 6-8 oz. Of beverage of choice as needed for constipation.   Probiotic Product (ADVANCED PROBIOTIC 10) CAPS Take 625 mg by mouth daily.   rivaroxaban (XARELTO) 10 MG TABS tablet Take 1 tablet (10 mg total) by mouth daily with breakfast for 20 days. Then take one 81 mg aspirin once a day for three weeks. Then discontinue aspirin.   sennosides-docusate sodium (SENOKOT-S) 8.6-50 MG tablet Take 1 tablet by mouth at bedtime as needed for constipation.   traMADol (ULTRAM) 50 MG tablet Take 1-2 tablets (50-100 mg total) by mouth every 6 (six) hours as needed for moderate  pain.   [DISCONTINUED] acetaminophen (TYLENOL) 325 MG tablet Take 650 mg by mouth every 4 (four) hours as needed.   [DISCONTINUED] aspirin EC 81 MG tablet Take 81 mg by mouth daily. Swallow whole.   [DISCONTINUED] bisacodyl (DULCOLAX) 10 MG suppository Place 10 mg rectally as needed for moderate constipation.   No facility-administered encounter medications on file as of 12/27/2021.    Review of Systems  Constitutional:  Negative for activity change, appetite change, fatigue and fever.  HENT: Negative.    Eyes: Negative.   Respiratory:  Negative for cough, shortness of breath and wheezing.   Cardiovascular:  Negative for chest pain and leg swelling.  Gastrointestinal:  Negative for abdominal distention, abdominal pain, constipation, diarrhea, nausea and vomiting.  Genitourinary:  Negative for dysuria, frequency and hematuria.  Musculoskeletal:  Positive for arthralgias, gait problem, joint swelling and myalgias.  Skin:  Positive for wound.  Neurological:  Negative for dizziness and headaches.  Psychiatric/Behavioral:  Negative for confusion and dysphoric mood. The patient is not  nervous/anxious.    Immunization History  Administered Date(s) Administered   Fluad Quad(high Dose 65+) 08/06/2019, 08/09/2020, 08/12/2021   Influenza, High Dose Seasonal PF 09/12/2013, 09/24/2015, 08/24/2016, 08/08/2018   Influenza,inj,Quad PF,6+ Mos 08/24/2014, 07/30/2017   Influenza-Unspecified 09/12/2013   PFIZER Comirnaty(Gray Top)Covid-19 Tri-Sucrose Vaccine 04/11/2021   PFIZER(Purple Top)SARS-COV-2 Vaccination 12/04/2019, 12/25/2019   Pfizer Covid-19 Vaccine Bivalent Booster 79yrs & up 11/03/2021   Pneumococcal Conjugate-13 07/22/2008, 01/Elizabeth/2016   Pneumococcal Polysaccharide-23 10/06/2016   Tetanus 10/08/2013   Zoster Recombinat (Shingrix) 11/07/2018, 05/21/2019   Zoster, Live 07/22/2008   Pertinent  Health Maintenance Due  Topic Date Due   INFLUENZA VACCINE  Completed   DEXA SCAN  Completed    Fall Risk 09/02/2021 11/23/2021 12/21/2021 12/21/2021 12/21/2021  Falls in the past year? 0 0 - - -  Was there an injury with Fall? 0 0 - - -  Fall Risk Category Calculator 0 0 - - -  Fall Risk Category Low Low - - -  Patient Fall Risk Level Low fall risk Low fall risk Low fall risk High fall risk High fall risk  Patient at Risk for Falls Due to No Fall Risks No Fall Risks - - -  Fall risk Follow up Falls evaluation completed Falls evaluation completed - - -   Functional Status Survey:    Vitals:   12/27/21 1019  BP: 135/74  Pulse: 92  Resp: 18  Temp: (!) 97.3 F (36.3 C)  SpO2: 96%  Height: 5\' 6"  (1.676 m)   Body mass index is 22.6 kg/m. Physical Exam Vitals reviewed.  Constitutional:      General: She is not in acute distress. HENT:     Head: Normocephalic.  Eyes:     General:        Right eye: No discharge.        Left eye: No discharge.  Cardiovascular:     Rate and Rhythm: Normal rate and regular rhythm.     Pulses: Normal pulses.     Heart sounds: Normal heart sounds. No murmur heard. Pulmonary:     Effort: Pulmonary effort is normal. No respiratory distress.     Breath sounds: Normal breath sounds. No wheezing.  Abdominal:     General: Bowel sounds are normal. There is no distension.     Palpations: Abdomen is soft.     Tenderness: There is no abdominal tenderness.  Musculoskeletal:     Cervical back: Neck supple.     Right lower leg: No edema.     Left lower leg: No edema.  Skin:    General: Skin is warm and dry.     Capillary Refill: Capillary refill takes less than 2 seconds.     Comments: Left hip dressing CDI, no drainage, surrounding skin intact. Right hip with non tender nodule, skin with brown color, no skin breakdown, swelling or drainage.   Neurological:     General: No focal deficit present.     Mental Status: She is alert and oriented to person, place, and time.  Psychiatric:        Mood and Affect: Mood normal.        Behavior: Behavior  normal.    Labs reviewed: Recent Labs    08/12/21 1008 12/08/21 1458 12/22/21 0318  NA 132* 129* 132*  K 4.9 4.4 4.6  CL 97 97* 99  CO2 Elizabeth 24 27   GLUCOSE 95 98 104*  BUN 17 10 9   CREATININE 0.75 0.99 0.67  CALCIUM 10.4 9.6  8.8*   Recent Labs    08/12/21 1008 12/08/21 1458  AST 19 24  ALT 21 22  ALKPHOS 56 56  BILITOT 0.7 0.6  PROT 7.6 7.1  ALBUMIN 4.9 4.2   Recent Labs    08/12/21 1008 12/08/21 1458 12/22/21 0318 12/26/21 0000  WBC 9.8 4.7 8.3 4.9  NEUTROABS 8.7*  --   --   --   HGB 12.6 11.6* 8.8* 9.1*  HCT 37.4 33.9* 26.1* 26*  MCV 98.3 98.0 99.2  --   PLT 326.0 257 194 324   Lab Results  Component Value Date   TSH 2.61 08/12/2021   Lab Results  Component Value Date   HGBA1C 4.1 10/09/2013   Lab Results  Component Value Date   CHOL 220 (H) 08/12/2021   HDL 106.80 08/12/2021   LDLCALC 90 08/12/2021   TRIG 116.0 08/12/2021   CHOLHDL 2 08/12/2021    Significant Diagnostic Results in last 30 days:  DG Pelvis Portable  Result Date: 12/21/2021 CLINICAL DATA:  Status post hip replacement EXAM: PORTABLE PELVIS 1-2 VIEWS COMPARISON:  Intraoperative radiographs obtained earlier the same day FINDINGS: Postsurgical changes reflecting left hip arthroplasty are seen. Alignment is within expected limits, without evidence of complication. There is expected surrounding postoperative soft tissue gas. There are moderate to severe degenerative changes about the right hip. Screws are seen transfixing the bilateral SI joints. There is faint lucency around the screws which may reflect loosening. IMPRESSION: 1. Status post left hip arthroplasty without evidence of complication. 2. 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 3. Bilateral SI joint screws with faint perihardware lucency which could reflect loosening. Electronically Signed   By: Valetta Mole M.D.   On: 12/21/2021 10:12   DG C-Arm 1-60 Min-No Report  Result Date: 12/21/2021 CLINICAL DATA:  Left  total hip replacement. EXAM: OPERATIVE LEFT HIP (WITH PELVIS IF PERFORMED) 9 VIEWS TECHNIQUE: Fluoroscopic spot image(s) were submitted for interpretation post-operatively. COMPARISON:  None. FINDINGS: Nine spot intraoperative fluoroscopic images of the left hip and lower pelvis are provided for review and demonstrate the sequela of left total hip replacement. Alignment appears anatomic given AP projection. No definite fracture. Note is made of pre-existing left-sided SI joint fusion as well as suspected operative repair of the lower lumbar spine, incompletely evaluated. Subcutaneous emphysema is noted about the operative site. A radiopaque sponge is overlying the operative site on several images though is not seen on the completion radiographs. No definite radiopaque foreign body. Limited visualization of the contralateral right hip suggests at least moderate degenerative change, incompletely evaluated. IMPRESSION: Post left total hip replacement without evidence of complication. Electronically Signed   By: Sandi Mariscal M.D.   On: 12/21/2021 09:40   DG HIP OPERATIVE UNILAT WITH PELVIS LEFT  Result Date: 12/21/2021 CLINICAL DATA:  Left total hip replacement. EXAM: OPERATIVE LEFT HIP (WITH PELVIS IF PERFORMED) 9 VIEWS TECHNIQUE: Fluoroscopic spot image(s) were submitted for interpretation post-operatively. COMPARISON:  None. FINDINGS: Nine spot intraoperative fluoroscopic images of the left hip and lower pelvis are provided for review and demonstrate the sequela of left total hip replacement. Alignment appears anatomic given AP projection. No definite fracture. Note is made of pre-existing left-sided SI joint fusion as well as suspected operative repair of the lower lumbar spine, incompletely evaluated. Subcutaneous emphysema is noted about the operative site. A radiopaque sponge is overlying the operative site on several images though is not seen on the completion radiographs. No definite radiopaque foreign body.  Limited  visualization of the contralateral right hip suggests at least moderate degenerative change, incompletely evaluated. IMPRESSION: Post left total hip replacement without evidence of complication. Electronically Signed   By: Sandi Mariscal M.D.   On: 12/21/2021 09:40    Assessment/Plan 1. Nodule of skin of right lower extremity - 01/04 given mupirocin and doxycycline x 10 days - non tender nodule nodule palpated today, afebrile - cont mupirocin ointment bid - may apply warm compress TID prn  - she plans to discuss with Dr. Wynelle Link and PCP  2. S/P total left hip arthroplasty - followed by Dr. Wynelle Link- f/u 02/14 - doing well, ambulating > 200 ft - pain tolerated with norco and robaxin - cont Xarelto for dvt prophylaxis  3. Drug- induced constipation - remains on norco due to hip surgery - LBM 02/06 and 02/07, abdomen soft - cont colace and miralax    Family/ staff Communication: plan discussed with patient and nurse  Labs/tests ordered:  none

## 2021-12-28 DIAGNOSIS — R2689 Other abnormalities of gait and mobility: Secondary | ICD-10-CM | POA: Diagnosis not present

## 2021-12-28 DIAGNOSIS — M6389 Disorders of muscle in diseases classified elsewhere, multiple sites: Secondary | ICD-10-CM | POA: Diagnosis not present

## 2021-12-28 DIAGNOSIS — R2681 Unsteadiness on feet: Secondary | ICD-10-CM | POA: Diagnosis not present

## 2021-12-28 DIAGNOSIS — R278 Other lack of coordination: Secondary | ICD-10-CM | POA: Diagnosis not present

## 2021-12-28 DIAGNOSIS — M160 Bilateral primary osteoarthritis of hip: Secondary | ICD-10-CM | POA: Diagnosis not present

## 2021-12-28 DIAGNOSIS — M62562 Muscle wasting and atrophy, not elsewhere classified, left lower leg: Secondary | ICD-10-CM | POA: Diagnosis not present

## 2021-12-28 DIAGNOSIS — Z96642 Presence of left artificial hip joint: Secondary | ICD-10-CM | POA: Diagnosis not present

## 2021-12-29 DIAGNOSIS — R2681 Unsteadiness on feet: Secondary | ICD-10-CM | POA: Diagnosis not present

## 2021-12-29 DIAGNOSIS — Z96642 Presence of left artificial hip joint: Secondary | ICD-10-CM | POA: Diagnosis not present

## 2021-12-29 DIAGNOSIS — M160 Bilateral primary osteoarthritis of hip: Secondary | ICD-10-CM | POA: Diagnosis not present

## 2021-12-29 DIAGNOSIS — R2689 Other abnormalities of gait and mobility: Secondary | ICD-10-CM | POA: Diagnosis not present

## 2021-12-29 DIAGNOSIS — I10 Essential (primary) hypertension: Secondary | ICD-10-CM | POA: Diagnosis not present

## 2021-12-29 DIAGNOSIS — R278 Other lack of coordination: Secondary | ICD-10-CM | POA: Diagnosis not present

## 2021-12-29 DIAGNOSIS — M62562 Muscle wasting and atrophy, not elsewhere classified, left lower leg: Secondary | ICD-10-CM | POA: Diagnosis not present

## 2021-12-29 DIAGNOSIS — M6389 Disorders of muscle in diseases classified elsewhere, multiple sites: Secondary | ICD-10-CM | POA: Diagnosis not present

## 2021-12-29 LAB — CBC: RBC: 2.59 — AB (ref 3.87–5.11)

## 2021-12-29 LAB — CBC AND DIFFERENTIAL
HCT: 26 — AB (ref 36–46)
Hemoglobin: 8.2 — AB (ref 12.0–16.0)
Platelets: 381 (ref 150–399)
WBC: 5.5

## 2021-12-30 DIAGNOSIS — R278 Other lack of coordination: Secondary | ICD-10-CM | POA: Diagnosis not present

## 2021-12-30 DIAGNOSIS — M62562 Muscle wasting and atrophy, not elsewhere classified, left lower leg: Secondary | ICD-10-CM | POA: Diagnosis not present

## 2021-12-30 DIAGNOSIS — M6389 Disorders of muscle in diseases classified elsewhere, multiple sites: Secondary | ICD-10-CM | POA: Diagnosis not present

## 2021-12-30 DIAGNOSIS — R2681 Unsteadiness on feet: Secondary | ICD-10-CM | POA: Diagnosis not present

## 2021-12-30 DIAGNOSIS — M160 Bilateral primary osteoarthritis of hip: Secondary | ICD-10-CM | POA: Diagnosis not present

## 2021-12-30 DIAGNOSIS — Z96642 Presence of left artificial hip joint: Secondary | ICD-10-CM | POA: Diagnosis not present

## 2021-12-30 DIAGNOSIS — R2689 Other abnormalities of gait and mobility: Secondary | ICD-10-CM | POA: Diagnosis not present

## 2022-01-02 ENCOUNTER — Non-Acute Institutional Stay (SKILLED_NURSING_FACILITY): Payer: Medicare HMO | Admitting: Internal Medicine

## 2022-01-02 ENCOUNTER — Encounter: Payer: Self-pay | Admitting: Internal Medicine

## 2022-01-02 DIAGNOSIS — M6389 Disorders of muscle in diseases classified elsewhere, multiple sites: Secondary | ICD-10-CM | POA: Diagnosis not present

## 2022-01-02 DIAGNOSIS — D649 Anemia, unspecified: Secondary | ICD-10-CM | POA: Diagnosis not present

## 2022-01-02 DIAGNOSIS — R278 Other lack of coordination: Secondary | ICD-10-CM | POA: Diagnosis not present

## 2022-01-02 DIAGNOSIS — Z96642 Presence of left artificial hip joint: Secondary | ICD-10-CM | POA: Diagnosis not present

## 2022-01-02 DIAGNOSIS — R2681 Unsteadiness on feet: Secondary | ICD-10-CM | POA: Diagnosis not present

## 2022-01-02 DIAGNOSIS — R2689 Other abnormalities of gait and mobility: Secondary | ICD-10-CM | POA: Diagnosis not present

## 2022-01-02 DIAGNOSIS — M160 Bilateral primary osteoarthritis of hip: Secondary | ICD-10-CM | POA: Diagnosis not present

## 2022-01-02 DIAGNOSIS — M62562 Muscle wasting and atrophy, not elsewhere classified, left lower leg: Secondary | ICD-10-CM | POA: Diagnosis not present

## 2022-01-02 DIAGNOSIS — G894 Chronic pain syndrome: Secondary | ICD-10-CM

## 2022-01-02 DIAGNOSIS — K5900 Constipation, unspecified: Secondary | ICD-10-CM

## 2022-01-02 MED ORDER — METHOCARBAMOL 500 MG PO TABS: 500.0000 mg | ORAL_TABLET | Freq: Four times a day (QID) | ORAL | 0 refills | Status: DC | PRN

## 2022-01-02 MED ORDER — HYDROCODONE-ACETAMINOPHEN 5-325 MG PO TABS: 1.0000 | ORAL_TABLET | Freq: Four times a day (QID) | ORAL | 0 refills | Status: DC | PRN

## 2022-01-02 NOTE — Progress Notes (Signed)
Location:  [place] Room Number: 2213 Place of Service:  SNF [phone])  Provider: Full Code  PCP: Midge Minium, MD Patient Care Team: Midge Minium, MD as PCP - General (Family Medicine) Paula Compton, MD as Consulting Physician (Obstetrics and Gynecology) Gloris Manchester, MD (Neurosurgery) Regal, Tamala Fothergill, DPM as Consulting Physician (Podiatry) Marica Otter, [place] (Optometry) Jovita Kussmaul, MD as Consulting Physician (General Surgery) Nicholas Lose, MD as Consulting Physician (Hematology and Oncology) Eppie Gibson, MD as Attending Physician (Radiation Oncology) Gardenia Phlegm, NP as Nurse Practitioner (Hematology and Oncology) Irene Shipper, MD as Consulting Physician (Gastroenterology)  Extended Emergency Contact Information Primary Emergency Contact: [facility] Address: [address] [place], [place] [address] [place] Phone: [phone] Mobile Phone: [phone] Relation: Spouse Secondary Emergency Contact: Larose Kells, [place] Phone: [phone] Mobile Phone: [phone] Relation: Daughter  Code Status: Full code Goals of care:  Advanced Directive information Advanced Directives 01/02/2022  Does Patient Have a Medical Advance Directive? Yes  Type of Paramedic of [place];Living will  Does patient want to make changes to medical advance directive? No - Patient declined  Copy of [place] in Chart? Yes - validated most recent copy scanned in chart (See row information)     No Known Allergies  Chief Complaint  Patient presents with   Discharge Note    Discharge     HPI:  81 y.o. 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 Maureen Ralphs on 02/01  She did well in SNF  She is already walking with her walker Her main complain seems to be Chronic Pain issues which she thinks have flared up now that she is not able to exercise and do her Pilate's. She wants to continue Norco for 2 more weeks to help her through this. No Other issue Lives with her husband Has appointment with Dr Maureen Ralphs tomorrow       Past Medical History:  Diagnosis Date   Arthritis    Cancer [facility])    pre cancer    Chicken pox    GERD (gastroesophageal reflux disease)    History of radiation therapy 06/26/17- 07/24/17   Left Breast 50.05 Gy total   Personal history of radiation therapy    Shingles 07/2002   Sleep apnea    mild uses oral devise   Urinary tract infection     Past Surgical History:  Procedure Laterality Date   BREAST BIOPSY Bilateral 03/07/2017   benign   BREAST BIOPSY Right 03/26/2017   benign   BREAST BIOPSY Left 03/26/2017   malignant   BREAST LUMPECTOMY Left 05/03/2017   BREAST LUMPECTOMY WITH RADIOACTIVE SEED LOCALIZATION Left 05/03/2017   Procedure: LEFT BREAST LUMPECTOMY WITH RADIOACTIVE SEED LOCALIZATION;  Surgeon: Jovita Kussmaul, MD;  Location: [place];  Service: General;  Laterality: Left;   COLONOSCOPY     HYSTEROSCOPY     Dr Ubaldo Glassing   SPINAL FUSION  12/01/2016   T10 to Pelvis   TOTAL HIP ARTHROPLASTY Left  12/21/2021   Procedure: TOTAL HIP ARTHROPLASTY ANTERIOR APPROACH;  Surgeon: Gaynelle Arabian, MD;  Location: [facility];  Service: Orthopedics;  Laterality: Left;   UPPER GASTROINTESTINAL ENDOSCOPY  11/30/2020   WISDOM TOOTH EXTRACTION     age 20's      reports that she quit smoking about 40 years ago. Her smoking use included cigarettes. She has never used smokeless tobacco. She reports current alcohol use of about 2.0 standard drinks per week.  She reports that she does not use drugs. Social History   Socioeconomic History   Marital status: Married    Spouse name: Not on file   Number of children: 2   Years of education: Not on file   Highest education level: Not on file  Occupational History   Occupation: retired  Tobacco Use   Smoking status: Former    Types: Cigarettes    Quit date: 02/18/1981    Years since quitting: 40.8   Smokeless tobacco: Never  Vaping Use   Vaping Use: Never used  Substance and Sexual Activity   Alcohol use: Yes    Alcohol/week: 2.0 standard drinks    Types: 2 Glasses of wine per week    Comment: 2 glasses 4 days/week   Drug use: No   Sexual activity: Yes  Other Topics Concern   Not on file  Social History Narrative   ** Merged History Encounter **       Social Determinants of Health   Financial Resource Strain: Low Risk    Difficulty of Paying Living Expenses: Not hard at all  Food Insecurity: No Food Insecurity   Worried About Charity fundraiser in the Last Year: Never true   [place] in the Last Year: Never true  Transportation Needs: No Transportation Needs   Lack of Transportation (Medical): No   Lack of Transportation (Non-Medical): No  Physical Activity: Insufficiently Active   Days of Exercise per Week: 4 days   Minutes of Exercise per Session: 30 min  Stress: No Stress Concern Present   Feeling of Stress : Not at all  Social Connections: Socially Integrated   Frequency of Communication with Friends and Family: More than three times a week   Frequency of Social Gatherings with Friends and Family: More than three times a week   Attends Religious Services: More than 4 times per year   Active Member of [employer] or Organizations: Yes   Attends Archivist Meetings: 1 to 4 times per year   Marital Status: Married  Human resources officer Violence: Not At Risk   Fear of Current or Ex-Partner: No   Emotionally Abused: No   Physically Abused: No   Sexually Abused: No    Functional Status Survey:    No Known Allergies  Pertinent  Health Maintenance Due  Topic Date Due   INFLUENZA VACCINE  Completed   DEXA SCAN  Completed    Medications: Outpatient Encounter Medications as of 01/02/2022  Medication Sig   acetaminophen (TYLENOL) 500 MG tablet Take 1,000 mg by mouth in the morning and at bedtime.   docusate sodium (COLACE) 100 MG capsule Take 100 mg by mouth 2 (two) times daily.   estradiol (ESTRACE) 0.1 MG/GM vaginal cream Place vaginally 3 (three) times a week.   gabapentin (NEURONTIN) 300 MG capsule TAKE 1 CAPSULE BY MOUTH FOUR TIMES A DAY   HYDROcodone-acetaminophen (NORCO/VICODIN) 5-325 MG tablet Take 1-2 tablets by mouth every 6 (six) hours as needed for moderate pain or severe  pain.   methocarbamol (ROBAXIN) 500 MG tablet Take 1 tablet (500 mg total) by mouth every 6 (six) hours as needed for muscle spasms.   Methylcellulose, Laxative, (CITRUCEL) 500 MG TABS Take 2,000 mg by mouth daily.   mupirocin ointment (BACTROBAN) 2 % Apply 1 application topically 2 (two) times daily.   omeprazole (PRILOSEC) 20 MG capsule Take 1 capsule (20 mg total) by mouth daily.   ondansetron (ZOFRAN) 4 MG tablet Take 1 tablet (4 mg total) by mouth every 6 (six) hours as needed for nausea.   polyethylene glycol (MIRALAX / GLYCOLAX) 17 g packet Take 17 g by mouth daily as needed. 6-8 oz. Of beverage of choice as needed for constipation.   Probiotic Product (ADVANCED PROBIOTIC 10) CAPS Take 625 mg by mouth daily.   rivaroxaban (XARELTO) 10 MG TABS tablet Take 1 tablet (10 mg total) by mouth daily with breakfast for 20 days. Then take one 81 mg aspirin once a day for three weeks. Then discontinue aspirin.   sennosides-docusate sodium (SENOKOT-S) 8.6-50 MG tablet Take 1 tablet by mouth at bedtime as needed for constipation.   traMADol (ULTRAM) 50 MG tablet Take 1-2 tablets (50-100 mg total) by mouth every 6 (six) hours as needed for moderate pain.   cholecalciferol (VITAMIN  D3) 25 MCG (1000 UNIT) tablet Take 1,000 Units by mouth daily.   montelukast (SINGULAIR) 10 MG tablet Take 1 tablet by mouth at bedtime.   [DISCONTINUED] HYDROcodone-acetaminophen (NORCO/VICODIN) 5-325 MG tablet Take 1-2 tablets by mouth every 6 (six) hours as needed for moderate pain or severe pain.   [DISCONTINUED] HYDROcodone-acetaminophen (NORCO/VICODIN) 5-325 MG tablet Take 1-2 tablets by mouth every 6 (six) hours as needed for moderate pain or severe pain.   [DISCONTINUED] methocarbamol (ROBAXIN) 500 MG tablet Take 1 tablet (500 mg total) by mouth every 6 (six) hours as needed for muscle spasms.   [DISCONTINUED] methocarbamol (ROBAXIN) 500 MG tablet Take 1 tablet (500 mg total) by mouth every 6 (six) hours as needed for muscle spasms.   No facility-administered encounter medications on file as of 01/02/2022.    Review of Systems  Vitals:   01/02/22 1313  BP: 92/63  Pulse: 94  Resp: 18  Temp: 99 F (37.2 C)  SpO2: 93%  Weight: 142 lb 3.2 oz (64.5 kg)  Height: 5\' 6"  (1.676 m)   Body mass index is 22.95 kg/m. Physical Exam Vitals reviewed.  Constitutional:      Appearance: Normal appearance.  HENT:     Head: Normocephalic.     Nose: Nose normal.     Mouth/Throat:     Mouth: Mucous membranes are moist.     Pharynx: Oropharynx is clear.  Eyes:     Pupils: Pupils are equal, round, and reactive to light.  Cardiovascular:     Rate and Rhythm: Normal rate and regular rhythm.     Pulses: Normal pulses.     Heart sounds: Normal heart sounds. No murmur heard. Pulmonary:     Effort: Pulmonary effort is normal.     Breath sounds: Normal breath sounds.  Abdominal:     General: Abdomen is flat. Bowel sounds are normal.     Palpations: Abdomen is soft.  Musculoskeletal:        General: No swelling.     Cervical back: Neck supple.  Skin:    General: Skin is warm.  Neurological:     General: No focal deficit present.     Mental Status: She is alert and oriented  to person,  place, and time.  Psychiatric:        Mood and Affect: Mood normal.        Thought Content: Thought content normal.    Labs reviewed: Basic Metabolic Panel: Recent Labs    08/12/21 1008 12/08/21 1458 12/22/21 0318  NA 132* 129* 132*  K 4.9 4.4 4.6  CL 97 97* 99  CO2 28 24 27   GLUCOSE 95 98 104*  BUN 17 10 9   CREATININE 0.75 0.99 0.67  CALCIUM 10.4 9.6 8.8*   Liver Function Tests: Recent Labs    08/12/21 1008 12/08/21 1458  AST 19 24  ALT 21 22  ALKPHOS 56 56  BILITOT 0.7 0.6  PROT 7.6 7.1  ALBUMIN 4.9 4.2   No results for input(s): LIPASE, AMYLASE in the last 8760 hours. No results for input(s): AMMONIA in the last 8760 hours. CBC: Recent Labs    08/12/21 1008 12/08/21 1458 12/22/21 0318 12/26/21 0000  WBC 9.8 4.7 8.3 4.9  NEUTROABS 8.7*  --   --   --   HGB 12.6 11.6* 8.8* 9.1*  HCT 37.4 33.9* 26.1* 26*  MCV 98.3 98.0 99.2  --   PLT 326.0 257 194 324   Cardiac Enzymes: No results for input(s): CKTOTAL, CKMB, CKMBINDEX, TROPONINI in the last 8760 hours. BNP: Invalid input(s): POCBNP CBG: No results for input(s): GLUCAP in the last 8760 hours.  Procedures and Imaging Studies During Stay: DG Pelvis Portable  Result Date: 12/21/2021 CLINICAL DATA:  Status post hip replacement EXAM: PORTABLE PELVIS 1-2 VIEWS COMPARISON:  Intraoperative radiographs obtained earlier the same day FINDINGS: Postsurgical changes reflecting left hip arthroplasty are seen. Alignment is within expected limits, without evidence of complication. There is expected surrounding postoperative soft tissue gas. There are moderate to severe degenerative changes about the right hip. Screws are seen transfixing the bilateral SI joints. There is faint lucency around the screws which may reflect loosening. IMPRESSION: 1. Status post left hip arthroplasty without evidence of complication. 2. 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 3. Bilateral SI joint screws with faint  perihardware lucency which could reflect loosening. Electronically Signed   By: Valetta Mole M.D.   On: 12/21/2021 10:12   DG C-Arm 1-60 Min-No Report  Result Date: 12/21/2021 CLINICAL DATA:  Left total hip replacement. EXAM: OPERATIVE LEFT HIP (WITH PELVIS IF PERFORMED) 9 VIEWS TECHNIQUE: Fluoroscopic spot image(s) were submitted for interpretation post-operatively. COMPARISON:  None. FINDINGS: Nine spot intraoperative fluoroscopic images of the left hip and lower pelvis are provided for review and demonstrate the sequela of left total hip replacement. Alignment appears anatomic given AP projection. No definite fracture. Note is made of pre-existing left-sided SI joint fusion as well as suspected operative repair of the lower lumbar spine, incompletely evaluated. Subcutaneous emphysema is noted about the operative site. A radiopaque sponge is overlying the operative site on several images though is not seen on the completion radiographs. No definite radiopaque foreign body. Limited visualization of the contralateral right hip suggests at least moderate degenerative change, incompletely evaluated. IMPRESSION: Post left total hip replacement without evidence of complication. Electronically Signed   By: Sandi Mariscal M.D.   On: 12/21/2021 09:40   DG HIP OPERATIVE UNILAT WITH PELVIS LEFT  Result Date: 12/21/2021 CLINICAL DATA:  Left total hip replacement. EXAM: OPERATIVE LEFT HIP (WITH PELVIS IF PERFORMED) 9 VIEWS TECHNIQUE: Fluoroscopic spot image(s) were submitted for interpretation post-operatively. COMPARISON:  None. FINDINGS: Nine spot intraoperative fluoroscopic images of the  left hip and lower pelvis are provided for review and demonstrate the sequela of left total hip replacement. Alignment appears anatomic given AP projection. No definite fracture. Note is made of pre-existing left-sided SI joint fusion as well as suspected operative repair of the lower lumbar spine, incompletely evaluated. Subcutaneous  emphysema is noted about the operative site. A radiopaque sponge is overlying the operative site on several images though is not seen on the completion radiographs. No definite radiopaque foreign body. Limited visualization of the contralateral right hip suggests at least moderate degenerative change, incompletely evaluated. IMPRESSION: Post left total hip replacement without evidence of complication. Electronically Signed   By: Sandi Mariscal M.D.   On: 12/21/2021 09:40    Assessment/Plan:   1. S/P total left hip arthroplasty Walking well with the walker Continue therapy at home Will continue Norco and Robaxin for few more weeks Also Discontinue Aspirin on 03/16  2. Anemia, unspecified type HGB improved with Iron Need follow up as Outpatient  3. Chronic pain syndrome Continue Norco for 2 more weeks Then follo wup with her PCP Also on High dose of neurontin 4. Constipation, unspecified constipation type On Miralax and Colace PRn    Patient is being discharged with the following home health services:  Therapy  Patient is being discharged with the following durable medical equipment:  Walker  Patient has been advised to f/u with their PCP in 1-2 weeks to for a transitions of care visit.  Social services at their facility was responsible for arranging this appointment.  Pt was provided with adequate pres/criptions of noncontrolled medications to reach the scheduled appointment .  For controlled substances, a limited supply was provided as appropriate for the individual patient.  If the pt normally receives these medications from a pain clinic or has a contract with another physician, these medications should be received from that clinic or physician only).    Future labs/tests needed:  CBC

## 2022-01-04 DIAGNOSIS — M160 Bilateral primary osteoarthritis of hip: Secondary | ICD-10-CM | POA: Diagnosis not present

## 2022-01-04 DIAGNOSIS — R2689 Other abnormalities of gait and mobility: Secondary | ICD-10-CM | POA: Diagnosis not present

## 2022-01-04 DIAGNOSIS — R278 Other lack of coordination: Secondary | ICD-10-CM | POA: Diagnosis not present

## 2022-01-04 DIAGNOSIS — M62562 Muscle wasting and atrophy, not elsewhere classified, left lower leg: Secondary | ICD-10-CM | POA: Diagnosis not present

## 2022-01-04 DIAGNOSIS — R2681 Unsteadiness on feet: Secondary | ICD-10-CM | POA: Diagnosis not present

## 2022-01-04 DIAGNOSIS — Z96642 Presence of left artificial hip joint: Secondary | ICD-10-CM | POA: Diagnosis not present

## 2022-01-05 DIAGNOSIS — M6389 Disorders of muscle in diseases classified elsewhere, multiple sites: Secondary | ICD-10-CM | POA: Diagnosis not present

## 2022-01-05 DIAGNOSIS — R278 Other lack of coordination: Secondary | ICD-10-CM | POA: Diagnosis not present

## 2022-01-05 DIAGNOSIS — R2689 Other abnormalities of gait and mobility: Secondary | ICD-10-CM | POA: Diagnosis not present

## 2022-01-05 DIAGNOSIS — Z96642 Presence of left artificial hip joint: Secondary | ICD-10-CM | POA: Diagnosis not present

## 2022-01-05 DIAGNOSIS — M160 Bilateral primary osteoarthritis of hip: Secondary | ICD-10-CM | POA: Diagnosis not present

## 2022-01-06 ENCOUNTER — Encounter: Payer: Self-pay | Admitting: Family Medicine

## 2022-01-06 ENCOUNTER — Telehealth: Payer: Self-pay

## 2022-01-06 ENCOUNTER — Ambulatory Visit (INDEPENDENT_AMBULATORY_CARE_PROVIDER_SITE_OTHER): Payer: Medicare HMO | Admitting: Family Medicine

## 2022-01-06 VITALS — BP 118/70 | HR 82 | Temp 97.9°F | Resp 16 | Wt 143.4 lb

## 2022-01-06 DIAGNOSIS — L089 Local infection of the skin and subcutaneous tissue, unspecified: Secondary | ICD-10-CM

## 2022-01-06 DIAGNOSIS — D500 Iron deficiency anemia secondary to blood loss (chronic): Secondary | ICD-10-CM | POA: Diagnosis not present

## 2022-01-06 DIAGNOSIS — Z96642 Presence of left artificial hip joint: Secondary | ICD-10-CM

## 2022-01-06 DIAGNOSIS — L729 Follicular cyst of the skin and subcutaneous tissue, unspecified: Secondary | ICD-10-CM

## 2022-01-06 LAB — CBC WITH DIFFERENTIAL/PLATELET
Basophils Absolute: 0.1 10*3/uL (ref 0.0–0.1)
Basophils Relative: 1 % (ref 0.0–3.0)
Eosinophils Absolute: 0 10*3/uL (ref 0.0–0.7)
Eosinophils Relative: 0.5 % (ref 0.0–5.0)
HCT: 26.5 % — ABNORMAL LOW (ref 36.0–46.0)
Hemoglobin: 9 g/dL — ABNORMAL LOW (ref 12.0–15.0)
Lymphocytes Relative: 8.7 % — ABNORMAL LOW (ref 12.0–46.0)
Lymphs Abs: 0.5 10*3/uL — ABNORMAL LOW (ref 0.7–4.0)
MCHC: 34 g/dL (ref 30.0–36.0)
MCV: 95.2 fl (ref 78.0–100.0)
Monocytes Absolute: 0.5 10*3/uL (ref 0.1–1.0)
Monocytes Relative: 8.6 % (ref 3.0–12.0)
Neutro Abs: 4.6 10*3/uL (ref 1.4–7.7)
Neutrophils Relative %: 81.2 % — ABNORMAL HIGH (ref 43.0–77.0)
Platelets: 534 10*3/uL — ABNORMAL HIGH (ref 150.0–400.0)
RBC: 2.79 Mil/uL — ABNORMAL LOW (ref 3.87–5.11)
RDW: 12.5 % (ref 11.5–15.5)
WBC: 5.7 10*3/uL (ref 4.0–10.5)

## 2022-01-06 MED ORDER — TRAMADOL HCL 50 MG PO TABS: 50.0000 mg | ORAL_TABLET | Freq: Four times a day (QID) | ORAL | 0 refills | Status: DC | PRN

## 2022-01-06 NOTE — Progress Notes (Signed)
° °  Subjective:    Patient ID: Elizabeth [name], female    DOB: 06-01-1941, 81 y.o.   MRN: [id number]  HPI Anemia- after L hip replacement.  Hgb 11.6 --> 8.8  Rehab noted improved Hgb w/ iron supplementation (9.1 on 2/6) but down to 8.2 again on 2/9.  Due for repeat CBC to trend levels.  Pt currently on iron supplement- FeSO4 325mg  daily.  Skin infxn- pt was seen 1/4 and given mupirocin and Doxycycline.  Area was drained on 1/20.  Saw NP in Rehab for same area that now appeared to be more of a nodule.  L hip replacement- pt is managing pain w/ 2 tramadol 3x/day and Tylenol in between.  Also using Methocarbamol 500mg   TID.  Has 4-5 days of Tramadol remaining.  Will need a refill.   Review of Systems For ROS see HPI   This visit occurred during the SARS-CoV-2 public health emergency.  Safety protocols were in place, including screening questions prior to the visit, additional usage of staff PPE, and extensive cleaning of exam room while observing appropriate contact time as indicated for disinfecting solutions.      Objective:   Physical Exam Vitals reviewed.  Constitutional:      General: She is not in acute distress.    Appearance: Normal appearance. She is well-developed. She is not ill-appearing.  HENT:     Head: Normocephalic and atraumatic.  Eyes:     Conjunctiva/sclera: Conjunctivae normal.     Pupils: Pupils are equal, round, and reactive to light.  Neck:     Thyroid: No thyromegaly.  Cardiovascular:     Rate and Rhythm: Normal rate and regular rhythm.     Pulses: Normal pulses.     Heart sounds: Normal heart sounds. No murmur heard. Pulmonary:     Effort: Pulmonary effort is normal. No respiratory distress.     Breath sounds: Normal breath sounds.  Abdominal:     General: There is no distension.     Palpations: Abdomen is soft.     Tenderness: There is no abdominal tenderness.  Musculoskeletal:     Cervical back: Normal range of motion and neck supple.   Lymphadenopathy:     Cervical: No cervical adenopathy.  Skin:    General: Skin is warm and dry.     Comments: Nodular area in subcutaneous tissue of R hip w/ central pore.  Gentle pressure produced some drainage from central pore.  Area is indurated but no fluctuance or pocket to drain.  Neurological:     General: No focal deficit present.     Mental Status: She is alert and oriented to person, place, and time.  Psychiatric:        Mood and Affect: Mood normal.        Behavior: Behavior normal.          Assessment & Plan:  Infected cyst- new.  Area seems to be improving but nodular area still present and small amount of debris expressed.  This was cultured.  Will refer to general surgery for definitive management of this area.  Pt expressed understanding and is in agreement w/ plan.

## 2022-01-06 NOTE — Telephone Encounter (Signed)
Pt aware.

## 2022-01-06 NOTE — Telephone Encounter (Signed)
-----   Message from Midge Minium, MD sent at 01/06/2022  4:03 PM EST ----- Hemoglobin is improving!  It's up to 9!  Continue your iron supplement and we'll continue to follow.

## 2022-01-06 NOTE — Patient Instructions (Signed)
We'll determine when you need to follow up based on your lab results We'll call you with your surgery referral for what appears to be an infected cyst Continue hot compresses Mupirocin twice daily Call with any questions or concerns Hang in there!!!

## 2022-01-10 ENCOUNTER — Encounter: Payer: Self-pay | Admitting: Family Medicine

## 2022-01-10 DIAGNOSIS — R2681 Unsteadiness on feet: Secondary | ICD-10-CM | POA: Diagnosis not present

## 2022-01-10 DIAGNOSIS — M62562 Muscle wasting and atrophy, not elsewhere classified, left lower leg: Secondary | ICD-10-CM | POA: Diagnosis not present

## 2022-01-10 DIAGNOSIS — R278 Other lack of coordination: Secondary | ICD-10-CM | POA: Diagnosis not present

## 2022-01-10 DIAGNOSIS — Z96642 Presence of left artificial hip joint: Secondary | ICD-10-CM | POA: Diagnosis not present

## 2022-01-10 DIAGNOSIS — M160 Bilateral primary osteoarthritis of hip: Secondary | ICD-10-CM | POA: Diagnosis not present

## 2022-01-10 DIAGNOSIS — R2689 Other abnormalities of gait and mobility: Secondary | ICD-10-CM | POA: Diagnosis not present

## 2022-01-10 LAB — WOUND CULTURE
MICRO NUMBER:: 13026204
RESULT:: NO GROWTH
SPECIMEN QUALITY:: ADEQUATE

## 2022-01-10 NOTE — Assessment & Plan Note (Signed)
New to provider.  Occurred in setting of hip replacement.  Currently on iron supplementation.  Check labs and make adjustments prn.

## 2022-01-10 NOTE — Assessment & Plan Note (Signed)
New.  Pt is doing well after L hip replacement but is having a hard time managing pain.  She feels the trauma of the surgery has flared her fibromyalgia.  Currently managing w/ 2 Tramadol TID and tylenol in between.  She is asking if I will assume prescribing her tramadol.  Refill sent

## 2022-01-11 ENCOUNTER — Telehealth: Payer: Self-pay

## 2022-01-11 DIAGNOSIS — M160 Bilateral primary osteoarthritis of hip: Secondary | ICD-10-CM | POA: Diagnosis not present

## 2022-01-11 DIAGNOSIS — M62562 Muscle wasting and atrophy, not elsewhere classified, left lower leg: Secondary | ICD-10-CM | POA: Diagnosis not present

## 2022-01-11 DIAGNOSIS — R2681 Unsteadiness on feet: Secondary | ICD-10-CM | POA: Diagnosis not present

## 2022-01-11 DIAGNOSIS — R2689 Other abnormalities of gait and mobility: Secondary | ICD-10-CM | POA: Diagnosis not present

## 2022-01-11 DIAGNOSIS — R278 Other lack of coordination: Secondary | ICD-10-CM | POA: Diagnosis not present

## 2022-01-11 DIAGNOSIS — Z96642 Presence of left artificial hip joint: Secondary | ICD-10-CM | POA: Diagnosis not present

## 2022-01-11 NOTE — Telephone Encounter (Signed)
Patient aware of results.

## 2022-01-11 NOTE — Telephone Encounter (Signed)
-----   Message from Midge Minium, MD sent at 01/11/2022  7:36 AM EST ----- No bacteria grew from the wound culture so this is most likely consistent w/ a cyst like we discussed.  No need to keep applying topical antibiotic ointment at this time

## 2022-01-12 DIAGNOSIS — R2689 Other abnormalities of gait and mobility: Secondary | ICD-10-CM | POA: Diagnosis not present

## 2022-01-12 DIAGNOSIS — Z96642 Presence of left artificial hip joint: Secondary | ICD-10-CM | POA: Diagnosis not present

## 2022-01-12 DIAGNOSIS — R278 Other lack of coordination: Secondary | ICD-10-CM | POA: Diagnosis not present

## 2022-01-12 DIAGNOSIS — M6389 Disorders of muscle in diseases classified elsewhere, multiple sites: Secondary | ICD-10-CM | POA: Diagnosis not present

## 2022-01-12 DIAGNOSIS — M160 Bilateral primary osteoarthritis of hip: Secondary | ICD-10-CM | POA: Diagnosis not present

## 2022-01-16 DIAGNOSIS — M160 Bilateral primary osteoarthritis of hip: Secondary | ICD-10-CM | POA: Diagnosis not present

## 2022-01-16 DIAGNOSIS — Z96642 Presence of left artificial hip joint: Secondary | ICD-10-CM | POA: Diagnosis not present

## 2022-01-16 DIAGNOSIS — R2689 Other abnormalities of gait and mobility: Secondary | ICD-10-CM | POA: Diagnosis not present

## 2022-01-16 DIAGNOSIS — M62562 Muscle wasting and atrophy, not elsewhere classified, left lower leg: Secondary | ICD-10-CM | POA: Diagnosis not present

## 2022-01-16 DIAGNOSIS — R2681 Unsteadiness on feet: Secondary | ICD-10-CM | POA: Diagnosis not present

## 2022-01-16 DIAGNOSIS — R278 Other lack of coordination: Secondary | ICD-10-CM | POA: Diagnosis not present

## 2022-01-17 ENCOUNTER — Encounter: Payer: Self-pay | Admitting: Family Medicine

## 2022-01-18 MED ORDER — METHOCARBAMOL 500 MG PO TABS: 500.0000 mg | ORAL_TABLET | Freq: Four times a day (QID) | ORAL | 0 refills | Status: DC | PRN

## 2022-01-20 ENCOUNTER — Other Ambulatory Visit: Payer: Self-pay | Admitting: Family Medicine

## 2022-01-20 ENCOUNTER — Other Ambulatory Visit: Payer: Self-pay | Admitting: Internal Medicine

## 2022-01-20 DIAGNOSIS — M25552 Pain in left hip: Secondary | ICD-10-CM

## 2022-01-20 DIAGNOSIS — R1310 Dysphagia, unspecified: Secondary | ICD-10-CM

## 2022-01-25 DIAGNOSIS — R278 Other lack of coordination: Secondary | ICD-10-CM | POA: Diagnosis not present

## 2022-01-25 DIAGNOSIS — R2681 Unsteadiness on feet: Secondary | ICD-10-CM | POA: Diagnosis not present

## 2022-01-25 DIAGNOSIS — M62562 Muscle wasting and atrophy, not elsewhere classified, left lower leg: Secondary | ICD-10-CM | POA: Diagnosis not present

## 2022-01-25 DIAGNOSIS — M160 Bilateral primary osteoarthritis of hip: Secondary | ICD-10-CM | POA: Diagnosis not present

## 2022-01-25 DIAGNOSIS — Z4789 Encounter for other orthopedic aftercare: Secondary | ICD-10-CM | POA: Diagnosis not present

## 2022-01-25 DIAGNOSIS — R2689 Other abnormalities of gait and mobility: Secondary | ICD-10-CM | POA: Diagnosis not present

## 2022-01-25 DIAGNOSIS — Z96642 Presence of left artificial hip joint: Secondary | ICD-10-CM | POA: Diagnosis not present

## 2022-01-26 ENCOUNTER — Encounter: Payer: Self-pay | Admitting: Family Medicine

## 2022-01-26 MED ORDER — METHOCARBAMOL 500 MG PO TABS: 500.0000 mg | ORAL_TABLET | Freq: Four times a day (QID) | ORAL | 0 refills | Status: DC | PRN

## 2022-02-01 DIAGNOSIS — L723 Sebaceous cyst: Secondary | ICD-10-CM | POA: Diagnosis not present

## 2022-02-10 ENCOUNTER — Other Ambulatory Visit: Payer: Self-pay | Admitting: Family Medicine

## 2022-02-10 DIAGNOSIS — Z853 Personal history of malignant neoplasm of breast: Secondary | ICD-10-CM

## 2022-02-15 DIAGNOSIS — L723 Sebaceous cyst: Secondary | ICD-10-CM | POA: Diagnosis not present

## 2022-03-02 ENCOUNTER — Other Ambulatory Visit: Payer: Self-pay

## 2022-03-02 ENCOUNTER — Encounter (HOSPITAL_BASED_OUTPATIENT_CLINIC_OR_DEPARTMENT_OTHER): Payer: Self-pay | Admitting: General Surgery

## 2022-03-06 ENCOUNTER — Ambulatory Visit (INDEPENDENT_AMBULATORY_CARE_PROVIDER_SITE_OTHER): Payer: Medicare HMO | Admitting: Family Medicine

## 2022-03-06 ENCOUNTER — Encounter: Payer: Self-pay | Admitting: Family Medicine

## 2022-03-06 VITALS — BP 124/80 | HR 77 | Temp 97.9°F | Resp 16 | Wt 140.4 lb

## 2022-03-06 DIAGNOSIS — Z01818 Encounter for other preprocedural examination: Secondary | ICD-10-CM

## 2022-03-06 LAB — BASIC METABOLIC PANEL
BUN: 10 mg/dL (ref 6–23)
CO2: 26 mEq/L (ref 19–32)
Calcium: 9.7 mg/dL (ref 8.4–10.5)
Chloride: 97 mEq/L (ref 96–112)
Creatinine, Ser: 0.68 mg/dL (ref 0.40–1.20)
GFR: 82.95 mL/min (ref >60.00)
Glucose, Bld: 90 mg/dL (ref 70–99)
Potassium: 4.8 mEq/L (ref 3.5–5.1)
Sodium: 130 mEq/L — ABNORMAL LOW (ref 135–145)

## 2022-03-06 LAB — CBC WITH DIFFERENTIAL/PLATELET
Basophils Absolute: 0 10*3/uL (ref 0.0–0.1)
Basophils Relative: 1.1 % (ref 0.0–3.0)
Eosinophils Absolute: 0.1 10*3/uL (ref 0.0–0.7)
Eosinophils Relative: 1.2 % (ref 0.0–5.0)
HCT: 36.2 % (ref 36.0–46.0)
Hemoglobin: 12.4 g/dL (ref 12.0–15.0)
Lymphocytes Relative: 18.3 % (ref 12.0–46.0)
Lymphs Abs: 0.7 10*3/uL (ref 0.7–4.0)
MCHC: 34.2 g/dL (ref 30.0–36.0)
MCV: 93.9 fl (ref 78.0–100.0)
Monocytes Absolute: 0.5 10*3/uL (ref 0.1–1.0)
Monocytes Relative: 12.2 % — ABNORMAL HIGH (ref 3.0–12.0)
Neutro Abs: 2.7 10*3/uL (ref 1.4–7.7)
Neutrophils Relative %: 67.2 % (ref 43.0–77.0)
Platelets: 293 10*3/uL (ref 150.0–400.0)
RBC: 3.86 Mil/uL — ABNORMAL LOW (ref 3.87–5.11)
RDW: 13.5 % (ref 11.5–15.5)
WBC: 4.1 10*3/uL (ref 4.0–10.5)

## 2022-03-06 LAB — HEPATIC FUNCTION PANEL
ALT: 15 U/L (ref 0–35)
AST: 19 U/L (ref 0–37)
Albumin: 4.5 g/dL (ref 3.5–5.2)
Alkaline Phosphatase: 74 U/L (ref 39–117)
Bilirubin, Direct: 0.1 mg/dL (ref 0.0–0.3)
Total Bilirubin: 0.5 mg/dL (ref 0.2–1.2)
Total Protein: 7.4 g/dL (ref 6.0–8.3)

## 2022-03-06 LAB — TSH: TSH: 2.62 u[IU]/mL (ref 0.35–5.50)

## 2022-03-06 MED ORDER — METHOCARBAMOL 500 MG PO TABS: 500.0000 mg | ORAL_TABLET | Freq: Four times a day (QID) | ORAL | 0 refills | Status: DC | PRN

## 2022-03-06 NOTE — Patient Instructions (Signed)
Follow up as needed or as scheduled ?We'll notify you of your lab results and make any changes if needed ?I will complete the paperwork and fax it back to Dr Wynelle Link ?Keep up the good work!  You look great! ?Call with any questions or concerns ?GOOD LUCK NEXT WEEK!!! ?Happy Spring!!! ?

## 2022-03-06 NOTE — Progress Notes (Signed)
Subjective:  ? ? Elizabeth [name] is a 81 y.o. female who presents to the office today for a preoperative consultation at the request of surgeon Aluisio who plans on performing R total hip on June 7. This consultation is requested for the specific conditions prompting preoperative evaluation (i.e. because of potential affect on operative risk): anemia. Planned anesthesia: general, spinal. The patient has the following known anesthesia issues:  no hx of anesthesia problems . Patients bleeding risk: no recent abnormal bleeding and no remote history of abnormal bleeding. Patient does not have objections to receiving blood products if needed. ? ?The following portions of the patient's history were reviewed and updated as appropriate: allergies, current medications, past family history, past medical history, past social history, past surgical history, and problem list. ? ?Review of Systems ?A comprehensive review of systems was negative.  ? ? Objective:  ? ? BP 124/80   Pulse 77   Temp 97.9 ?F (36.6 ?C)   Resp 16   Wt 140 lb 6.4 oz (63.7 kg)   SpO2 96%   BMI 21.99 kg/m?  ? ?General Appearance:    Alert, cooperative, no distress, appears stated age  ?Head:    Normocephalic, without obvious abnormality, atraumatic  ?Eyes:    PERRL, conjunctiva/corneas clear, EOM's intact, both eyes  ?Ears:    Normal TM's and external ear canals, both ears  ?Nose:   Nares normal, septum midline, mucosa normal, no drainage  or sinus tenderness  ?Throat:   Lips, mucosa, and tongue normal; teeth and gums normal  ?Neck:   Supple, symmetrical, trachea midline, no adenopathy;  ?  thyroid:  no enlargement/tenderness/nodules  ?Back:     Symmetric, no curvature, ROM normal, no CVA tenderness  ?Lungs:     Clear to auscultation bilaterally, respirations unlabored  ?Chest Wall:    No tenderness or deformity  ? Heart:    Regular rate and rhythm, S1 and S2 normal, no murmur, rub   or gallop  ?Breast Exam:    Deferred  ?Abdomen:     Soft,  non-tender, bowel sounds active all four quadrants,  ?  no masses, no organomegaly  ?Genitalia:    deferred  ?Rectal:    ?Extremities:   Extremities normal, atraumatic, no cyanosis or edema  ?Pulses:   2+ and symmetric all extremities  ?Skin:   Skin color, texture, turgor normal, no rashes or lesions  ?Lymph nodes:   Cervical, supraclavicular, and axillary nodes normal  ?Neurologic:   CNII-XII intact, normal strength, sensation and reflexes  ?  throughout  ? ? ?Predictors of intubation difficulty: ? Morbid obesity? no ? Anatomically abnormal facies? no ? Prominent incisors? no ? Receding mandible? no ? Short, thick neck? no ? Neck range of motion: normal ? Dentition: No chipped, loose, or missing teeth. ? ?Cardiographics ?ECG: no change since previous ECG dated 2014 ?Echocardiogram: not done ? ?Imaging ?Chest x-ray:  NA   ? ?Lab Review  ?No visits with results within 2 Month(s) from this visit.  ?Latest known visit with results is:  ?Office Visit on 01/06/2022  ?Component Date Value  ? MICRO NUMBER: 01/06/2022 [id number]   ? SPECIMEN QUALITY: 01/06/2022 Adequate   ? SOURCE: 01/06/2022 NOT GIVEN   ? STATUS: 01/06/2022 FINAL   ? GRAM STAIN: 01/06/2022 No organisms or white blood cells seen No epithelial cells seen   ? RESULT: 01/06/2022 No Growth   ? COMMENT: 01/06/2022 No source was provided. The specimen was tested and reported based upon the  test code ordered. If this is incorrect, please contact client services.   ? WBC 01/06/2022 5.7   ? RBC 01/06/2022 2.79 (L)   ? Hemoglobin 01/06/2022 9.0 (L)   ? HCT 01/06/2022 26.5 (L)   ? MCV 01/06/2022 95.2   ? MCHC 01/06/2022 34.0   ? RDW 01/06/2022 12.5   ? Platelets 01/06/2022 534.0 Repeated and verified X2. (H)   ? Neutrophils Relative % 01/06/2022 81.2 (H)   ? Lymphocytes Relative 01/06/2022 8.7 (L)   ? Monocytes Relative 01/06/2022 8.6   ? Eosinophils Relative 01/06/2022 0.5   ? Basophils Relative 01/06/2022 1.0   ? Neutro Abs 01/06/2022 4.6   ? Lymphs Abs 01/06/2022 0.5  (L)   ? Monocytes Absolute 01/06/2022 0.5   ? Eosinophils Absolute 01/06/2022 0.0   ? Basophils Absolute 01/06/2022 0.1   ?  ? ? Assessment:  ?   ? 81 y.o. female with planned surgery as above.   ?Known risk factors for perioperative complications: Anemia   ?Difficulty with intubation is not anticipated. ? ?Cardiac Risk Estimation: low ? ?Current medications which may produce withdrawal symptoms if withheld perioperatively: none  ? ? Plan:  ? ? 1. Preoperative workup as follows ECG, hemoglobin, hematocrit, electrolytes, creatinine, glucose, liver function studies. ?2. Change in medication regimen before surgery: discontinue ASA 14 days before surgery and discontinue NSAIDs (OTC) 14 days before surgery. ?3. Prophylaxis for cardiac events with perioperative beta-blockers: not indicated. ?4. Invasive hemodynamic monitoring perioperatively: at the discretion of anesthesiologist. ?5. Deep vein thrombosis prophylaxis postoperatively:regimen to be chosen by surgical team. ?6. Surveillance for postoperative MI with ECG immediately postoperatively and on postoperative days 1 and 2 AND troponin levels 24 hours postoperatively and on day 4 or hospital discharge (whichever comes first): at the discretion of anesthesiologist. ?7. Other measures:  none  ?

## 2022-03-07 ENCOUNTER — Encounter: Payer: Self-pay | Admitting: Family Medicine

## 2022-03-07 NOTE — Telephone Encounter (Signed)
Pt reported back an estimate of water intake and has asked if you would like her to track this more thoroughly and report back ?

## 2022-03-08 ENCOUNTER — Encounter: Payer: Self-pay | Admitting: Family Medicine

## 2022-03-08 ENCOUNTER — Other Ambulatory Visit: Payer: Self-pay | Admitting: Family Medicine

## 2022-03-08 DIAGNOSIS — E871 Hypo-osmolality and hyponatremia: Secondary | ICD-10-CM

## 2022-03-10 ENCOUNTER — Other Ambulatory Visit: Payer: Self-pay | Admitting: Internal Medicine

## 2022-03-10 ENCOUNTER — Encounter (HOSPITAL_BASED_OUTPATIENT_CLINIC_OR_DEPARTMENT_OTHER): Payer: Self-pay | Admitting: General Surgery

## 2022-03-10 ENCOUNTER — Other Ambulatory Visit: Payer: Self-pay

## 2022-03-10 ENCOUNTER — Ambulatory Visit (HOSPITAL_BASED_OUTPATIENT_CLINIC_OR_DEPARTMENT_OTHER)
Admission: RE | Admit: 2022-03-10 | Discharge: 2022-03-10 | Disposition: A | Discharge status: Home / Self Care | Payer: Medicare HMO | Attending: General Surgery | Admitting: General Surgery

## 2022-03-10 ENCOUNTER — Ambulatory Visit (HOSPITAL_BASED_OUTPATIENT_CLINIC_OR_DEPARTMENT_OTHER): Payer: Medicare HMO | Admitting: Certified Registered Nurse Anesthetist

## 2022-03-10 ENCOUNTER — Ambulatory Visit: Payer: Self-pay | Admitting: General Surgery

## 2022-03-10 ENCOUNTER — Encounter (HOSPITAL_BASED_OUTPATIENT_CLINIC_OR_DEPARTMENT_OTHER)
Admission: RE | Disposition: A | Discharge status: Home / Self Care | Payer: Self-pay | Source: Home / Self Care | Attending: General Surgery

## 2022-03-10 DIAGNOSIS — Z87891 Personal history of nicotine dependence: Secondary | ICD-10-CM | POA: Insufficient documentation

## 2022-03-10 DIAGNOSIS — M1990 Unspecified osteoarthritis, unspecified site: Secondary | ICD-10-CM | POA: Insufficient documentation

## 2022-03-10 DIAGNOSIS — G4733 Obstructive sleep apnea (adult) (pediatric): Secondary | ICD-10-CM | POA: Insufficient documentation

## 2022-03-10 DIAGNOSIS — L720 Epidermal cyst: Secondary | ICD-10-CM | POA: Diagnosis not present

## 2022-03-10 DIAGNOSIS — R1310 Dysphagia, unspecified: Secondary | ICD-10-CM

## 2022-03-10 DIAGNOSIS — L0889 Other specified local infections of the skin and subcutaneous tissue: Secondary | ICD-10-CM | POA: Diagnosis not present

## 2022-03-10 DIAGNOSIS — Z853 Personal history of malignant neoplasm of breast: Secondary | ICD-10-CM | POA: Insufficient documentation

## 2022-03-10 DIAGNOSIS — K219 Gastro-esophageal reflux disease without esophagitis: Secondary | ICD-10-CM | POA: Diagnosis not present

## 2022-03-10 DIAGNOSIS — L986 Other infiltrative disorders of the skin and subcutaneous tissue: Secondary | ICD-10-CM | POA: Diagnosis not present

## 2022-03-10 DIAGNOSIS — L723 Sebaceous cyst: Secondary | ICD-10-CM

## 2022-03-10 DIAGNOSIS — I10 Essential (primary) hypertension: Secondary | ICD-10-CM | POA: Insufficient documentation

## 2022-03-10 DIAGNOSIS — Z79899 Other long term (current) drug therapy: Secondary | ICD-10-CM | POA: Insufficient documentation

## 2022-03-10 HISTORY — PX: CYST EXCISION: SHX5701

## 2022-03-10 SURGERY — CYST REMOVAL
Anesthesia: General | Site: Hip | Laterality: Right

## 2022-03-10 MED ORDER — DEXAMETHASONE SODIUM PHOSPHATE 4 MG/ML IJ SOLN
INTRAMUSCULAR | Status: DC | PRN
  Administered 2022-03-10: 5 mg via INTRAVENOUS

## 2022-03-10 MED ORDER — FENTANYL CITRATE (PF) 100 MCG/2ML IJ SOLN
INTRAMUSCULAR | Status: DC | PRN
  Administered 2022-03-10: 25 ug via INTRAVENOUS

## 2022-03-10 MED ORDER — GABAPENTIN 100 MG PO CAPS
100.0000 mg | ORAL_CAPSULE | ORAL | Status: DC
Start: 2022-03-10 — End: 2022-03-10

## 2022-03-10 MED ORDER — ONDANSETRON HCL 4 MG/2ML IJ SOLN: 4.0000 mg | Freq: Once | INTRAMUSCULAR | Status: DC | PRN

## 2022-03-10 MED ORDER — CEFAZOLIN SODIUM-DEXTROSE 2-4 GM/100ML-% IV SOLN
2.0000 g | INTRAVENOUS | Status: AC
  Administered 2022-03-10: 2 g via INTRAVENOUS

## 2022-03-10 MED ORDER — CELECOXIB 100 MG PO CAPS: 100.0000 mg | ORAL_CAPSULE | ORAL | Status: DC

## 2022-03-10 MED ORDER — DEXAMETHASONE SODIUM PHOSPHATE 10 MG/ML IJ SOLN
INTRAMUSCULAR | Status: AC
  Filled 2022-03-10: qty 1

## 2022-03-10 MED ORDER — AMISULPRIDE (ANTIEMETIC) 5 MG/2ML IV SOLN: 10.0000 mg | Freq: Once | INTRAVENOUS | Status: DC | PRN

## 2022-03-10 MED ORDER — PROPOFOL 10 MG/ML IV BOLUS
INTRAVENOUS | Status: AC
  Filled 2022-03-10: qty 20

## 2022-03-10 MED ORDER — ONDANSETRON HCL 4 MG/2ML IJ SOLN
INTRAMUSCULAR | Status: AC
  Filled 2022-03-10: qty 2

## 2022-03-10 MED ORDER — FENTANYL CITRATE (PF) 100 MCG/2ML IJ SOLN: 25.0000 ug | INTRAMUSCULAR | Status: DC | PRN

## 2022-03-10 MED ORDER — ACETAMINOPHEN 500 MG PO TABS: 1000.0000 mg | ORAL_TABLET | ORAL | Status: DC

## 2022-03-10 MED ORDER — CHLORHEXIDINE GLUCONATE CLOTH 2 % EX PADS: 6.0000 | MEDICATED_PAD | Freq: Once | CUTANEOUS | Status: DC

## 2022-03-10 MED ORDER — LIDOCAINE 2% (20 MG/ML) 5 ML SYRINGE
INTRAMUSCULAR | Status: AC
  Filled 2022-03-10: qty 5

## 2022-03-10 MED ORDER — LIDOCAINE HCL (CARDIAC) PF 100 MG/5ML IV SOSY
PREFILLED_SYRINGE | INTRAVENOUS | Status: DC | PRN
  Administered 2022-03-10: 50 mg via INTRAVENOUS

## 2022-03-10 MED ORDER — OXYCODONE HCL 5 MG PO TABS: 5.0000 mg | ORAL_TABLET | Freq: Four times a day (QID) | ORAL | 0 refills | Status: DC | PRN

## 2022-03-10 MED ORDER — PROPOFOL 10 MG/ML IV BOLUS
INTRAVENOUS | Status: DC | PRN
  Administered 2022-03-10: 150 mg via INTRAVENOUS

## 2022-03-10 MED ORDER — LACTATED RINGERS IV SOLN: INTRAVENOUS | Status: DC

## 2022-03-10 MED ORDER — BUPIVACAINE-EPINEPHRINE 0.25% -1:200000 IJ SOLN
INTRAMUSCULAR | Status: DC | PRN
  Administered 2022-03-10: 10 mL

## 2022-03-10 MED ORDER — ONDANSETRON HCL 4 MG/2ML IJ SOLN
INTRAMUSCULAR | Status: DC | PRN
  Administered 2022-03-10: 4 mg via INTRAVENOUS

## 2022-03-10 MED ORDER — PHENYLEPHRINE HCL (PRESSORS) 10 MG/ML IV SOLN
INTRAVENOUS | Status: DC | PRN
  Administered 2022-03-10 (×3): 80 ug via INTRAVENOUS

## 2022-03-10 MED ORDER — PHENYLEPHRINE 80 MCG/ML (10ML) SYRINGE FOR IV PUSH (FOR BLOOD PRESSURE SUPPORT)
PREFILLED_SYRINGE | INTRAVENOUS | Status: AC
  Filled 2022-03-10: qty 10

## 2022-03-10 MED ORDER — FENTANYL CITRATE (PF) 100 MCG/2ML IJ SOLN
INTRAMUSCULAR | Status: AC
Start: 2022-03-10 — End: ?
  Filled 2022-03-10: qty 2

## 2022-03-10 SURGICAL SUPPLY — 39 items
ADH SKN CLS APL DERMABOND .7 (GAUZE/BANDAGES/DRESSINGS) ×1
APL PRP STRL LF DISP 70% ISPRP (MISCELLANEOUS) ×1
APL SKNCLS STERI-STRIP NONHPOA (GAUZE/BANDAGES/DRESSINGS) ×1
BENZOIN TINCTURE PRP APPL 2/3 (GAUZE/BANDAGES/DRESSINGS) ×2 IMPLANT
BLADE SURG 10 STRL SS (BLADE) ×2 IMPLANT
BLADE SURG 15 STRL LF DISP TIS (BLADE) ×1 IMPLANT
BLADE SURG 15 STRL SS (BLADE) ×2
CANISTER SUCT 1200ML W/VALVE (MISCELLANEOUS) ×1 IMPLANT
CHLORAPREP W/TINT 26 (MISCELLANEOUS) ×2 IMPLANT
CLEANER CAUTERY TIP 5X5 PAD (MISCELLANEOUS) ×1 IMPLANT
COVER BACK TABLE 60X90IN (DRAPES) ×2 IMPLANT
COVER MAYO STAND STRL (DRAPES) ×2 IMPLANT
DERMABOND ADVANCED (GAUZE/BANDAGES/DRESSINGS) ×1
DERMABOND ADVANCED .7 DNX12 (GAUZE/BANDAGES/DRESSINGS) IMPLANT
DRAPE LAPAROTOMY 100X72 PEDS (DRAPES) ×2 IMPLANT
DRAPE UTILITY XL STRL (DRAPES) ×2 IMPLANT
ELECT REM PT RETURN 9FT ADLT (ELECTROSURGICAL) ×2
ELECTRODE REM PT RTRN 9FT ADLT (ELECTROSURGICAL) ×1 IMPLANT
GLOVE BIO SURGEON STRL SZ 6.5 (GLOVE) ×1 IMPLANT
GLOVE BIO SURGEON STRL SZ7.5 (GLOVE) ×2 IMPLANT
GLOVE BIOGEL PI IND STRL 6.5 (GLOVE) IMPLANT
GLOVE BIOGEL PI INDICATOR 6.5 (GLOVE) ×1
GOWN STRL REUS W/ TWL LRG LVL3 (GOWN DISPOSABLE) ×2 IMPLANT
GOWN STRL REUS W/TWL LRG LVL3 (GOWN DISPOSABLE) ×4
NDL HYPO 25X1 1.5 SAFETY (NEEDLE) IMPLANT
NEEDLE HYPO 25X1 1.5 SAFETY (NEEDLE) ×2 IMPLANT
NS IRRIG 1000ML POUR BTL (IV SOLUTION) ×2 IMPLANT
PACK BASIN DAY SURGERY FS (CUSTOM PROCEDURE TRAY) ×2 IMPLANT
PAD CLEANER CAUTERY TIP 5X5 (MISCELLANEOUS) ×1
PENCIL SMOKE EVACUATOR (MISCELLANEOUS) ×2 IMPLANT
SPONGE T-LAP 18X18 ~~LOC~~+RFID (SPONGE) ×2 IMPLANT
STRIP CLOSURE SKIN 1/2X4 (GAUZE/BANDAGES/DRESSINGS) ×2 IMPLANT
SUT MON AB 4-0 PC3 18 (SUTURE) ×1 IMPLANT
SUT VIC AB 3-0 SH 27 (SUTURE) ×2
SUT VIC AB 3-0 SH 27X BRD (SUTURE) IMPLANT
SYR CONTROL 10ML LL (SYRINGE) ×1 IMPLANT
TOWEL GREEN STERILE FF (TOWEL DISPOSABLE) ×3 IMPLANT
TUBE CONNECTING 20X1/4 (TUBING) ×1 IMPLANT
YANKAUER SUCT BULB TIP NO VENT (SUCTIONS) ×1 IMPLANT

## 2022-03-10 NOTE — Op Note (Addendum)
03/10/2022 ? ?1:36 PM ? ?PATIENT:  Elizabeth [name]  81 y.o. female ? ?PRE-OPERATIVE DIAGNOSIS:  SEBACEOUS CYST RIGHT HIP ? ?POST-OPERATIVE DIAGNOSIS:  SEBACEOUS CYST RIGHT HIP ? ?PROCEDURE:  Procedure(s): ?EXCISION SEBACEOUS CYST RIGHT HIP (Right) ? ?SURGEON:  Surgeon(s) and Role: ?   Jovita Kussmaul, MD - Primary ? ?PHYSICIAN ASSISTANT:  ? ?ASSISTANTS: none  ? ?ANESTHESIA:   local and general ? ?EBL:  10 mL  ? ?BLOOD ADMINISTERED:none ? ?DRAINS: none  ? ?LOCAL MEDICATIONS USED:  MARCAINE    ? ?SPECIMEN:  Source of Specimen:  sebaceous cyst right hip ? ?DISPOSITION OF SPECIMEN:  PATHOLOGY ? ?COUNTS:  YES ? ?TOURNIQUET:  * No tourniquets in log * ? ?DICTATION: .Dragon Dictation ? ?After informed consent was obtained the patient was brought to the operating room and placed in the supine position on the operating table.  After adequate induction of general anesthesia the patient's right hip area was prepped with ChloraPrep, allowed to dry, and draped in usual sterile manner.  An appropriate timeout was performed.  There was a moderate sized area of induration associated with the right lateral hip area.  The area around this was infiltrated with quarter percent Marcaine.  An elliptical incision was made with a 15 blade knife around the area of induration.  The incision was carried through the skin and subcutaneous tissue sharply with the electrocautery.  The entire cyst was removed into the subcutaneous tissue.  At 1 point I did enter the cyst cavity and a large amount of white fluid was evacuated.  There was no odor.  This did allow me to see the wall of the cyst so that the entire wall was removed.  The cyst measured approximately 4 x 2 cm.  Once the entire cyst was removed and only healthy tissue remained the wound was then irrigated with copious amounts of saline.  Hemostasis was achieved using the Bovie electrocautery.  The cyst was sent to pathology for further evaluation.  The deep layer of the incision  was closed with interrupted 3-0 Vicryl stitches.  The skin was then closed with a running 4-0 Monocryl subcuticular stitch.  Dermabond dressings were applied.  The patient tolerated the procedure well.  At the end of the case all needle sponge and instrument counts were correct.  The patient was then awakened and taken to recovery in stable condition. ? ?PLAN OF CARE: Discharge to home after PACU ? ?PATIENT DISPOSITION:  PACU - hemodynamically stable. ?  ?Delay start of Pharmacological VTE agent (>24hrs) due to surgical blood loss or risk of bleeding: not applicable ? ?

## 2022-03-10 NOTE — Anesthesia Procedure Notes (Signed)
Procedure Name: LMA Insertion ?Date/Time: 03/10/2022 1:05 PM ?Performed by: Bufford Spikes, CRNA ?Pre-anesthesia Checklist: Patient identified, Emergency Drugs available, Suction available and Patient being monitored ?Patient Re-evaluated:Patient Re-evaluated prior to induction ?Oxygen Delivery Method: Circle system utilized ?Preoxygenation: Pre-oxygenation with 100% oxygen ?Induction Type: IV induction ?Ventilation: Mask ventilation without difficulty ?LMA: LMA inserted ?LMA Size: 4.0 ?Number of attempts: 1 ?Placement Confirmation: positive ETCO2 ?Tube secured with: Tape ?Dental Injury: Teeth and Oropharynx as per pre-operative assessment  ? ? ? ? ?

## 2022-03-10 NOTE — Anesthesia Postprocedure Evaluation (Signed)
Anesthesia Post Note ? ?Patient: Elizabeth [name] ? ?Procedure(s) Performed: EXCISION SEBACEOUS CYST RIGHT HIP (Right: Hip) ? ?  ? ?Patient location during evaluation: PACU ?Anesthesia Type: General ?Level of consciousness: awake ?Pain management: pain level controlled ?Vital Signs Assessment: post-procedure vital signs reviewed and stable ?Respiratory status: spontaneous breathing, nonlabored ventilation, respiratory function stable and patient connected to nasal cannula oxygen ?Cardiovascular status: blood pressure returned to baseline and stable ?Postop Assessment: no apparent nausea or vomiting ?Anesthetic complications: no ? ? ?No notable events documented. ? ?Last Vitals:  ?Vitals:  ? 03/10/22 1400 03/10/22 1431  ?BP: 135/80 (!) 167/80  ?Pulse: 79 77  ?Resp: 20 16  ?Temp:  36.6 ?C  ?SpO2: 97% 99%  ?  ?Last Pain:  ?Vitals:  ? 03/10/22 1416  ?PainSc: 0-No pain  ? ? ?  ?  ?  ?  ?  ?  ? ?Leoma Folds P Krizia Flight ? ? ? ? ?

## 2022-03-10 NOTE — Transfer of Care (Signed)
Immediate Anesthesia Transfer of Care Note ? ?Patient: Elizabeth [name] ? ?Procedure(s) Performed: EXCISION SEBACEOUS CYST RIGHT HIP (Right: Hip) ? ?Patient Location: PACU ? ?Anesthesia Type:General ? ?Level of Consciousness: awake, alert  and oriented ? ?Airway & Oxygen Therapy: Patient Spontanous Breathing and Patient connected to nasal cannula oxygen ? ?Post-op Assessment: Report given to RN and Post -op Vital signs reviewed and stable ? ?Post vital signs: Reviewed and stable ? ?Last Vitals:  ?Vitals Value Taken Time  ?BP 129/72 03/10/22 1345  ?Temp    ?Pulse 81 03/10/22 1350  ?Resp 14 03/10/22 1350  ?SpO2 100 % 03/10/22 1350  ?Vitals shown include unvalidated device data. ? ?Last Pain:  ?Vitals:  ? 03/10/22 1155  ?PainSc: 3   ?   ? ?Patients Stated Pain Goal: 3 (03/10/22 1155) ? ?Complications: No notable events documented. ?

## 2022-03-10 NOTE — Interval H&P Note (Signed)
History and Physical Interval Note: ? ?03/10/2022 ?12:42 PM ? ?Elizabeth [name]  has presented today for surgery, with the diagnosis of SEBACEOUS CYST RIGHT HIP.  The various methods of treatment have been discussed with the patient and family. After consideration of risks, benefits and other options for treatment, the patient has consented to  Procedure(s): ?EXCISION SEBACEOUS CYST RIGHT HIP (Right) as a surgical intervention.  The patient's history has been reviewed, patient examined, no change in status, stable for surgery.  I have reviewed the patient's chart and labs.  Questions were answered to the patient's satisfaction.   ? ? ?Autumn Messing III ? ? ?

## 2022-03-10 NOTE — H&P (Signed)
?PROVIDER: Landry Corporal, MD ? ?MRN: [id number] ?DOB: [birth year] ?Subjective  ? ?Chief Complaint: Post Operative Visit ? ? ?History of Present Illness: ?Elizabeth [name] is a 81 y.o. female who is seen today for left breast cancer. 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 She tolerated the surgery well. She has had no major medical issues since her last visit. She denies any breast pain. She has had multiple urinary tract infections and has been started on some topical estrogen. Her last mammogram was in April and showed no evidence of malignancy. She recently developed a infected cyst on the right hip. She is scheduled for hip replacement surgery in about 3 months. The area has already been incised and drained by her medical doctor. There is some residual induration and slight redness present. ? ? ? ?Review of Systems: ?A complete review of systems was obtained from the patient. I have reviewed this information and discussed as appropriate with the patient. See HPI as well for other ROS. ? ?ROS  ? ?Medical History: ?Past Medical History:  ?Diagnosis Date  ? Constipation  ? Essential hypertension  ? Hearing loss  ? OSA on CPAP  ? Osteoarthritis  ? Post herpetic neuralgia  ? ?Patient Active Problem List  ?Diagnosis  ? Lumbar scoliosis  ? Apnea, sleep  ? Ductal carcinoma in situ (DCIS) of left breast  ? Sebaceous cyst  ? ?Past Surgical History:  ?Procedure Laterality Date  ? Hysteroscopy and D&C 2007  ? ORAL SURGERY OPERATIVE NOTE 2016  ? FUSION POSTERIOR SPINAL DEFORMITY UP TO 6 VERTEBRAL SEGMENTS Bilateral 12/01/2016  ?Procedure: **AIRO #1** T10-PELVIS POSTERIOR SPINAL FUSION; Surgeon: Almon Register, MD; Location: [facility]; Service: Neurosurgery; Laterality: Bilateral;  ? INSTRUMENTATION POSTERIOR SPINE 7 TO 12 VERTEBRAL SEGMENTS Bilateral 12/01/2016  ?Procedure: POSTERIOR SEGMENTAL  INSTRUMENTATION (EG, PEDICLE FIXATION, DUAL RODS WITH MULTIPLE HOOKS AND SUBLAMINAR WIRES); 7 TO 12 VERTEBRAL SEGMENTS (LIST IN ADDITION TO PRIMARY PROCEDURE); Surgeon: Almon Register, MD; Location: [facility]; Service: Neurosurgery; Laterality: Bilateral;  ? ARTHRODESIS POSTERIOR LUMBAR SPINE W/LAMINECTOMY/DISCECTOMY Bilateral 12/01/2016  ?Procedure: ARTHRODESIS, POSTERIOR INTERBODY TECHNIQUE, INCLUDING LAMINECTOMY AND/OR DISCECTOMY TO PREPARE INTERSPACE (OTHER THAN FOR DECOMPRESSION), SINGLE INTERSPACE; LUMBAR; Surgeon: Almon Register, MD; Location: [facility]; Service: Neurosurgery; Laterality: Bilateral;  ? INSERTION MORSELIZED BONE ALLOGRAFT FOR SPINE SURGERY Bilateral 12/01/2016  ?Procedure: ALLOGRAFT, MORSELIZED, OR PLACEMENT OF OSTEOPROMOTIVE MATERIAL, FOR SPINE SURGERY ONLY (LIST IN ADDITION TO PRIMARY PROCEDURE); Surgeon: Almon Register, MD; Location: [facility]; Service: Neurosurgery; Laterality: Bilateral;  ? AUTOGRAFT OBTAINED SAME INCISION FOR SPINE SURGERY Bilateral 12/01/2016  ?Procedure: AUTOGRAFT FOR SPINE SURGERY ONLY (INCL HARVESTING GRAFT); LOCAL (EG, RIBS, SPINOUS PROCESS, OR LAMINAR FRAGMENT) OBTAINED FROM SAME INCISION (LIST IN ADDITION TO PRIMARY PROCEDURE); Surgeon: Almon Register, MD; Location: [facility]; Service: Neurosurgery; Laterality: Bilateral;  ? ? ?No Known Allergies ? ?Current Outpatient Medications on File Prior to Visit  ?Medication Sig Dispense Refill  ? biotin 2,500 mcg Cap Take by mouth  ? celecoxib (CELEBREX) 100 MG capsule Take 1 capsule (100 mg total) by mouth 2 (two) times daily  ? cephalexin (KEFLEX) 500 MG capsule TAKE 1 CAPSULE (500 MG TOTAL) BY MOUTH 2 (TWO) TIMES DAILY. ADD TO PRIOR RX FOR 10 DAY TOTAL COURSE.  ? cholecalciferol (VITAMIN D3) 1000 unit tablet Vitamin D  ? estradiol (VIVELLE-DOT) patch 0.0375 mg/24 hr Vivelle-Dot 0.0375  mg/24 hr transdermal patch  ? gabapentin (NEURONTIN) 100 MG capsule gabapentin 100 mg  capsule ?TAKE 2 CAPSULES BY MOUTH 4 TIMES A DAY  ? mometasone (ELOCON) 0.1 % cream mometasone 0.1 % topical cream  ? montelukast (SINGULAIR) 10 mg tablet Take 1 tablet (10 mg total) by mouth at bedtime  ? omeprazole (PRILOSEC) 20 MG DR capsule omeprazole 20 mg capsule,delayed release ?TAKE 1 CAPSULE BY MOUTH EVERY DAY  ? progesterone (PROMETRIUM) 200 MG capsule progesterone micronized 200 mg capsule  ? sulfamethoxazole-trimethoprim (BACTRIM DS) 800-160 mg tablet  ? traMADol (ULTRAM) 50 mg tablet Take 1 tablet (50 mg total) by mouth every 6 (six) hours as needed for Pain for up to 60 doses 60 tablet 0  ? triamcinolone 0.1 % lotion triamcinolone acetonide 0.1 % lotion ?APPLY TO AFFECTED AREA NIGHTLY  ? TURMERIC ROOT EXTRACT ORAL Take 1,000 mg by mouth once daily.  ? valACYclovir (VALTREX) 1000 MG tablet valacyclovir 1 gram tablet  ? ?No current facility-administered medications on file prior to visit.  ? ?Family History  ?Problem Relation Age of Onset  ? Parkinsonism Mother  ? Diabetes Mother  ? Pneumonia Father  ? Prostate cancer Brother  ? ? ?Social History  ? ?Tobacco Use  ?Smoking Status Former  ? Packs/day: 1.00  ? Years: 12.00  ? Pack years: 12.00  ? Types: Cigarettes  ? Quit date: 11/02/1981  ? Years since quitting: 40.3  ?Smokeless Tobacco Never  ? ? ?Social History  ? ?Socioeconomic History  ? Marital status: Married  ?Tobacco Use  ? Smoking status: Former  ?Packs/day: 1.00  ?Years: 12.00  ?Pack years: 12.00  ?Types: Cigarettes  ?Quit date: 11/02/1981  ?Years since quitting: 40.3  ? Smokeless tobacco: Never  ?Vaping Use  ? Vaping Use: Never used  ?Substance and Sexual Activity  ? Alcohol use: Yes  ?Alcohol/week: 14.0 standard drinks  ?Types: 14 Glasses of wine per week  ? Drug use: No  ? ?Objective:  ? ?There were no vitals filed for this visit.  ?There is no height or weight on file to calculate BMI. ? ?Physical Exam ?Vitals reviewed.  ?Constitutional:  ?General: She is not in acute distress. ?Appearance:  Normal appearance.  ?HENT:  ?Head: Normocephalic and atraumatic.  ?Right Ear: External ear normal.  ?Left Ear: External ear normal.  ?Nose: Nose normal.  ?Mouth/Throat:  ?Mouth: Mucous membranes are moist.  ?Pharynx: Oropharynx is clear.  ?Eyes:  ?General: No scleral icterus. ?Extraocular Movements: Extraocular movements intact.  ?Conjunctiva/sclera: Conjunctivae normal.  ?Pupils: Pupils are equal, round, and reactive to light.  ?Cardiovascular:  ?Rate and Rhythm: Normal rate and regular rhythm.  ?Pulses: Normal pulses.  ?Heart sounds: Normal heart sounds.  ?Pulmonary:  ?Effort: Pulmonary effort is normal. No respiratory distress.  ?Breath sounds: Normal breath sounds.  ?Abdominal:  ?General: Bowel sounds are normal.  ?Palpations: Abdomen is soft.  ?Tenderness: There is no abdominal tenderness.  ?Musculoskeletal:  ?General: No swelling, tenderness or deformity. Normal range of motion.  ?Cervical back: Normal range of motion and neck supple.  ?Skin: ?General: Skin is warm and dry.  ?Coloration: Skin is not jaundiced.  ?Comments: There is some slight redness and about 4 cm of induration along the right lateral hip area. There is no fluctuance. There is no drainage.  ?Neurological:  ?General: No focal deficit present.  ?Mental Status: She is alert and oriented to person, place, and time.  ?Psychiatric:  ?Mood and Affect: Mood normal.  ?Behavior: Behavior normal.  ? ? ? ?Breast:  The lower outer left breast incision has healed nicely with no sign of infection or seroma. There is no palpable mass in either breast. There is no palpable axillary, supraclavicular, or cervical lymphadenopathy. ? ?Labs, Imaging and Diagnostic Testing: ? ?Assessment and Plan:  ? ?Diagnoses and all orders for this visit: ? ?Sebaceous cyst ? ? ? 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 She continues to do well with no clinical evidence of recurrence. At this point she will continue to do  regular self exams. She will be due for her next mammogram in April. If this is negative then I will plan to see her back in 1 year. The area has not improved on doxycycline. In order to help prevent any possible

## 2022-03-10 NOTE — Anesthesia Preprocedure Evaluation (Addendum)
Anesthesia Evaluation  ?Patient identified by MRN, date of birth, ID band ?Patient awake ? ? ? ?Reviewed: ?Allergy & Precautions, NPO status , Patient's Chart, lab work & pertinent test results ? ?Airway ?Mallampati: II ? ?TM Distance: >3 FB ?Neck ROM: Full ? ? ? Dental ?no notable dental hx. ? ?  ?Pulmonary ?sleep apnea , former smoker,  ?  ?Pulmonary exam normal ? ? ? ? ? ? ? Cardiovascular ?negative cardio ROS ?Normal cardiovascular exam ? ? ?  ?Neuro/Psych ?negative neurological ROS ? negative psych ROS  ? GI/Hepatic ?Neg liver ROS, GERD  Medicated and Controlled,  ?Endo/Other  ?negative endocrine ROS ? Renal/GU ?negative Renal ROS  ? ?  ?Musculoskeletal ? ?(+) Arthritis ,  ? Abdominal ?  ?Peds ? Hematology ?negative hematology ROS ?(+)   ?Anesthesia Other Findings ?SEBACEOUS CYST RIGHT HIP ? Reproductive/Obstetrics ? ?  ? ? ? ? ? ? ? ? ? ? ? ? ? ?  ?  ? ? ? ? ? ? ? ?Anesthesia Physical ?Anesthesia Plan ? ?ASA: 2 ? ?Anesthesia Plan: General  ? ?Post-op Pain Management:   ? ?Induction: Intravenous ? ?PONV Risk Score and Plan: 3 and Ondansetron, Dexamethasone, Midazolam and Treatment may vary due to age or medical condition ? ?Airway Management Planned: LMA ? ?Additional Equipment:  ? ?Intra-op Plan:  ? ?Post-operative Plan: Extubation in OR ? ?Informed Consent: I have reviewed the patients History and Physical, chart, labs and discussed the procedure including the risks, benefits and alternatives for the proposed anesthesia with the patient or authorized representative who has indicated his/her understanding and acceptance.  ? ? ? ?Dental advisory given ? ?Plan Discussed with: CRNA ? ?Anesthesia Plan Comments:   ? ? ? ? ? ?Anesthesia Quick Evaluation ? ?

## 2022-03-10 NOTE — Discharge Instructions (Signed)

## 2022-03-13 ENCOUNTER — Encounter (HOSPITAL_BASED_OUTPATIENT_CLINIC_OR_DEPARTMENT_OTHER): Payer: Self-pay | Admitting: General Surgery

## 2022-03-14 ENCOUNTER — Telehealth: Payer: Self-pay | Admitting: Internal Medicine

## 2022-03-14 DIAGNOSIS — R1310 Dysphagia, unspecified: Secondary | ICD-10-CM

## 2022-03-14 LAB — SURGICAL PATHOLOGY

## 2022-03-14 MED ORDER — OMEPRAZOLE 20 MG PO CPDR: 20.0000 mg | DELAYED_RELEASE_CAPSULE | Freq: Every day | ORAL | 0 refills | Status: DC

## 2022-03-14 NOTE — Telephone Encounter (Signed)
Inbound call from patient requesting a medication refill for omeprazole. Sent to [facility]. She have an appt scheduled 5/24 ?

## 2022-03-14 NOTE — Telephone Encounter (Signed)
Omeprazole refilled.

## 2022-03-16 ENCOUNTER — Ambulatory Visit
Admission: RE | Admit: 2022-03-16 | Discharge: 2022-03-16 | Disposition: A | Discharge status: Home / Self Care | Payer: Medicare HMO | Source: Ambulatory Visit | Attending: Family Medicine | Admitting: Family Medicine

## 2022-03-16 DIAGNOSIS — R922 Inconclusive mammogram: Secondary | ICD-10-CM | POA: Diagnosis not present

## 2022-03-16 DIAGNOSIS — Z853 Personal history of malignant neoplasm of breast: Secondary | ICD-10-CM | POA: Diagnosis not present

## 2022-03-22 ENCOUNTER — Other Ambulatory Visit (INDEPENDENT_AMBULATORY_CARE_PROVIDER_SITE_OTHER): Payer: Medicare HMO

## 2022-03-22 DIAGNOSIS — E871 Hypo-osmolality and hyponatremia: Secondary | ICD-10-CM | POA: Diagnosis not present

## 2022-03-22 LAB — BASIC METABOLIC PANEL
BUN: 10 mg/dL (ref 6–23)
CO2: 27 mEq/L (ref 19–32)
Calcium: 9.6 mg/dL (ref 8.4–10.5)
Chloride: 98 mEq/L (ref 96–112)
Creatinine, Ser: 0.74 mg/dL (ref 0.40–1.20)
GFR: 77.03 mL/min (ref >60.00)
Glucose, Bld: 138 mg/dL — ABNORMAL HIGH (ref 70–99)
Potassium: 4.2 mEq/L (ref 3.5–5.1)
Sodium: 132 mEq/L — ABNORMAL LOW (ref 135–145)

## 2022-03-23 ENCOUNTER — Telehealth: Payer: Self-pay

## 2022-03-23 ENCOUNTER — Encounter: Payer: Self-pay | Admitting: Family Medicine

## 2022-03-23 NOTE — Telephone Encounter (Signed)
-----   Message from Midge Minium, MD sent at 03/23/2022  7:32 AM EDT ----- ?Sodium is improving.  This is great news! ?

## 2022-03-23 NOTE — Telephone Encounter (Signed)
Spoke w/ pt and advised of lab results . Pt expressed verbal understanding . ?

## 2022-04-04 NOTE — H&P (Signed)
]      ROS ] 

## 2022-04-12 ENCOUNTER — Ambulatory Visit: Payer: Medicare HMO | Admitting: Internal Medicine

## 2022-04-13 ENCOUNTER — Encounter (HOSPITAL_COMMUNITY): Payer: Self-pay

## 2022-04-13 ENCOUNTER — Encounter (HOSPITAL_COMMUNITY): Admission: RE | Admit: 2022-04-13 | Payer: Medicare HMO | Source: Ambulatory Visit

## 2022-04-14 ENCOUNTER — Encounter: Payer: Self-pay | Admitting: Family Medicine

## 2022-04-22 ENCOUNTER — Other Ambulatory Visit: Payer: Self-pay | Admitting: Family Medicine

## 2022-04-22 DIAGNOSIS — M25552 Pain in left hip: Secondary | ICD-10-CM

## 2022-04-24 NOTE — Telephone Encounter (Signed)
Patient is requesting a refill of the following medications: Requested Prescriptions   Pending Prescriptions Disp Refills   methocarbamol (ROBAXIN) 500 MG tablet [Pharmacy Med Name: METHOCARBAMOL 500 MG TABLET] 120 tablet 0    Sig: TAKE 1 TABLET BY MOUTH EVERY 6 HOURS AS NEEDED FOR MUSCLE SPASMS.   gabapentin (NEURONTIN) 300 MG capsule [Pharmacy Med Name: GABAPENTIN 300 MG CAPSULE] 360 capsule 0    Sig: TAKE 1 CAPSULE BY MOUTH FOUR TIMES A DAY    Date of patient request: 04/22/2022 Last office visit: 03/06/2022 Date of last refill: 03/06/2022 Last refill amount: 120 tablets Follow up time period per chart: 08/16/2022

## 2022-05-09 DIAGNOSIS — L723 Sebaceous cyst: Secondary | ICD-10-CM | POA: Diagnosis not present

## 2022-05-11 DIAGNOSIS — H57813 Brow ptosis, bilateral: Secondary | ICD-10-CM | POA: Diagnosis not present

## 2022-05-11 DIAGNOSIS — H0279 Other degenerative disorders of eyelid and periocular area: Secondary | ICD-10-CM | POA: Diagnosis not present

## 2022-05-11 DIAGNOSIS — H02831 Dermatochalasis of right upper eyelid: Secondary | ICD-10-CM | POA: Diagnosis not present

## 2022-05-11 DIAGNOSIS — H02413 Mechanical ptosis of bilateral eyelids: Secondary | ICD-10-CM | POA: Diagnosis not present

## 2022-05-11 DIAGNOSIS — H53483 Generalized contraction of visual field, bilateral: Secondary | ICD-10-CM | POA: Diagnosis not present

## 2022-05-11 DIAGNOSIS — H02834 Dermatochalasis of left upper eyelid: Secondary | ICD-10-CM | POA: Diagnosis not present

## 2022-05-11 DIAGNOSIS — H02423 Myogenic ptosis of bilateral eyelids: Secondary | ICD-10-CM | POA: Diagnosis not present

## 2022-05-29 ENCOUNTER — Other Ambulatory Visit: Payer: Self-pay | Admitting: Family Medicine

## 2022-05-30 DIAGNOSIS — L723 Sebaceous cyst: Secondary | ICD-10-CM | POA: Diagnosis not present

## 2022-05-30 DIAGNOSIS — L089 Local infection of the skin and subcutaneous tissue, unspecified: Secondary | ICD-10-CM | POA: Diagnosis not present

## 2022-05-30 NOTE — Telephone Encounter (Signed)
Patient is requesting a refill of the following medications: Requested Prescriptions   Pending Prescriptions Disp Refills   methocarbamol (ROBAXIN) 500 MG tablet [Pharmacy Med Name: METHOCARBAMOL 500 MG TABLET] 120 tablet 0    Sig: TAKE 1 TABLET BY MOUTH EVERY 6 HOURS AS NEEDED FOR MUSCLE SPASMS.    Date of patient request: 05/30/22 Last office visit: 03/06/22 Date of last refill: 04/26/22 Last refill amount: 120

## 2022-06-11 ENCOUNTER — Other Ambulatory Visit: Payer: Self-pay | Admitting: Family Medicine

## 2022-06-20 NOTE — Progress Notes (Signed)
Anesthesia Review:  PCP: Jannetta Quint 03/06/22  Clearance 03/14/22 on chart Cardiologist : none  Chest x-ray : EKG : 03/06/2022  on chart and in epic  Echo : Stress test: Cardiac Cath :  Activity level: can do a flight of stairs without difficulty  Sleep Study/ CPAP : uses oral device for sleep apnea  Fasting Blood Sugar :      / Checks Blood Sugar -- times a day:   Blood Thinner/ Instructions /Last Dose: ASA / Instructions/ Last Dose :   PT lives at [place] .  Will go to [facility] after surgery  03/10/22- exc of seb cyst  05/30/22 was Seen b Dr Marlou Starks for F/u - note in epic 05/30/22.  12/21/21 left hip  PT has hearing aids.

## 2022-06-20 NOTE — Patient Instructions (Signed)
SURGICAL WAITING ROOM VISITATION Patients having surgery or a procedure may have no more than 2 support people in the waiting area - these visitors may rotate.   Children under the age of 64 must have an adult with them who is not the patient. If the patient needs to stay at the hospital during part of their recovery, the visitor guidelines for inpatient rooms apply. Pre-op nurse will coordinate an appropriate time for 1 support person to accompany patient in pre-op.  This support person may not rotate.    Please refer to the [facility] website for the visitor guidelines for Inpatients (after your surgery is over and you are in a regular room).       Your procedure is scheduled on:  07/05/2022    Report to [facility] Main Entrance    Report to admitting at    [phone]   Call this number if you have problems the morning of surgery [phone]   Do not eat food :After Midnight.   After Midnight you may have the following liquids until __ 0700am ____ AM/ PM DAY OF SURGERY  Water Non-Citrus Juices (without pulp, NO RED) Carbonated Beverages Black Coffee (NO MILK/CREAM OR CREAMERS, sugar ok)  Clear Tea (NO MILK/CREAM OR CREAMERS, sugar ok) regular and decaf                             Plain Jell-O (NO RED)                                           Fruit ices (not with fruit pulp, NO RED)                                     Popsicles (NO RED)                                                               Sports drinks like Gatorade (NO RED)                     The day of surgery:  Drink ONE (1) Pre-Surgery Clear Ensure or G2 at   0700( to be completed by )  AM the morning of surgery. Drink in one sitting. Do not sip.  This drink was given to you during your hospital  pre-op appointment visit. Nothing else to drink after completing the  Pre-Surgery Clear Ensure or G2.        Oral Hygiene is also important to reduce your risk of infection.                                     Remember - BRUSH YOUR TEETH THE MORNING OF SURGERY WITH YOUR REGULAR TOOTHPASTE   Do NOT smoke after Midnight   Take these medicines the morning of surgery with A SIP OF WATER:  gabapentin, claritin, prilosec   DO NOT TAKE ANY ORAL DIABETIC MEDICATIONS DAY OF YOUR SURGERY  Bring  CPAP mask and tubing day of surgery.                              You may not have any metal on your body including hair pins, jewelry, and body piercing             Do not wear make-up, lotions, powders, perfumes/cologne, or deodorant  Do not wear nail polish including gel and S&S, artificial/acrylic nails, or any other type of covering on natural nails including finger and toenails. If you have artificial nails, gel coating, etc. that needs to be removed by a nail salon please have this removed prior to surgery or surgery may need to be canceled/ delayed if the surgeon/ anesthesia feels like they are unable to be safely monitored.   Do not shave  48 hours prior to surgery.               Men may shave face and neck.   Do not bring valuables to the hospital. [place].   Contacts, dentures or bridgework may not be worn into surgery.   Bring small overnight bag day of surgery.   DO NOT [place]. PHARMACY WILL DISPENSE MEDICATIONS LISTED ON YOUR MEDICATION LIST TO YOU DURING YOUR ADMISSION [place]!    Patients discharged on the day of surgery will not be allowed to drive home.  Someone NEEDS to stay with you for the first 24 hours after anesthesia.   Special Instructions: Bring a copy of your healthcare power of attorney and living will documents         the day of surgery if you haven't scanned them before.              Please read over the following fact sheets you were given: IF YOU HAVE QUESTIONS ABOUT YOUR PRE-OP INSTRUCTIONS PLEASE CALL [phone]     [facility] - Preparing for Surgery Before surgery,  you can play an important role.  Because skin is not sterile, your skin needs to be as free of germs as possible.  You can reduce the number of germs on your skin by washing with CHG (chlorahexidine gluconate) soap before surgery.  CHG is an antiseptic cleaner which kills germs and bonds with the skin to continue killing germs even after washing. Please DO NOT use if you have an allergy to CHG or antibacterial soaps.  If your skin becomes reddened/irritated stop using the CHG and inform your nurse when you arrive at Short Stay. Do not shave (including legs and underarms) for at least 48 hours prior to the first CHG shower.  You may shave your face/neck. Please follow these instructions carefully:  1.  Shower with CHG Soap the night before surgery and the  morning of Surgery.  2.  If you choose to wash your hair, wash your hair first as usual with your  normal  shampoo.  3.  After you shampoo, rinse your hair and body thoroughly to remove the  shampoo.                           4.  Use CHG as you would any other liquid soap.  You can apply chg directly  to the skin and wash  Gently with a scrungie or clean washcloth.  5.  Apply the CHG Soap to your body ONLY FROM THE NECK DOWN.   Do not use on face/ open                           Wound or open sores. Avoid contact with eyes, ears mouth and genitals (private parts).                       Wash face,  Genitals (private parts) with your normal soap.             6.  Wash thoroughly, paying special attention to the area where your surgery  will be performed.  7.  Thoroughly rinse your body with warm water from the neck down.  8.  DO NOT shower/wash with your normal soap after using and rinsing off  the CHG Soap.                9.  Pat yourself dry with a clean towel.            10.  Wear clean pajamas.            11.  Place clean sheets on your bed the night of your first shower and do not  sleep with pets. Day of Surgery : Do not  apply any lotions/deodorants the morning of surgery.  Please wear clean clothes to the hospital/surgery center.  FAILURE TO FOLLOW THESE INSTRUCTIONS MAY RESULT IN THE CANCELLATION OF YOUR SURGERY PATIENT SIGNATURE_________________________________  NURSE SIGNATURE__________________________________  ________________________________________________________________________

## 2022-06-22 ENCOUNTER — Encounter (HOSPITAL_COMMUNITY): Payer: Self-pay

## 2022-06-22 ENCOUNTER — Other Ambulatory Visit: Payer: Self-pay

## 2022-06-22 ENCOUNTER — Encounter (HOSPITAL_COMMUNITY)
Admission: RE | Admit: 2022-06-22 | Discharge: 2022-06-22 | Disposition: A | Discharge status: Home / Self Care | Payer: Medicare HMO | Source: Ambulatory Visit | Attending: Orthopedic Surgery | Admitting: Orthopedic Surgery

## 2022-06-22 VITALS — BP 138/81 | HR 77 | Temp 98.5°F | Resp 16 | Ht 66.5 in | Wt 143.0 lb

## 2022-06-22 DIAGNOSIS — Z01812 Encounter for preprocedural laboratory examination: Secondary | ICD-10-CM | POA: Insufficient documentation

## 2022-06-22 DIAGNOSIS — E119 Type 2 diabetes mellitus without complications: Secondary | ICD-10-CM | POA: Insufficient documentation

## 2022-06-22 DIAGNOSIS — Z01818 Encounter for other preprocedural examination: Secondary | ICD-10-CM

## 2022-06-22 LAB — CBC
HCT: 33.5 % — ABNORMAL LOW (ref 36.0–46.0)
Hemoglobin: 11.3 g/dL — ABNORMAL LOW (ref 12.0–15.0)
MCH: 33.3 pg (ref 26.0–34.0)
MCHC: 33.7 g/dL (ref 30.0–36.0)
MCV: 98.8 fL (ref 80.0–100.0)
Platelets: 275 10*3/uL (ref 150–400)
RBC: 3.39 MIL/uL — ABNORMAL LOW (ref 3.87–5.11)
RDW: 11.9 % (ref 11.5–15.5)
WBC: 5.3 10*3/uL (ref 4.0–10.5)
nRBC: 0 % (ref 0.0–0.2)

## 2022-06-22 LAB — TYPE AND SCREEN
ABO/RH(D): O POS
Antibody Screen: NEGATIVE

## 2022-06-22 LAB — SURGICAL PCR SCREEN
MRSA, PCR: NEGATIVE
Staphylococcus aureus: NEGATIVE

## 2022-07-04 NOTE — Anesthesia Preprocedure Evaluation (Signed)
Anesthesia Evaluation  Patient identified by MRN, date of birth, ID band Patient awake    Reviewed: Allergy & Precautions, NPO status , Patient's Chart, lab work & pertinent test results  History of Anesthesia Complications Negative for: history of anesthetic complications  Airway Mallampati: II  TM Distance: >3 FB Neck ROM: Full    Dental no notable dental hx.    Pulmonary sleep apnea , former smoker,    Pulmonary exam normal        Cardiovascular negative cardio ROS Normal cardiovascular exam     Neuro/Psych negative neurological ROS  negative psych ROS   GI/Hepatic Neg liver ROS, GERD  Medicated and Controlled,  Endo/Other  negative endocrine ROS  Renal/GU negative Renal ROS     Musculoskeletal  (+) Arthritis ,   Abdominal   Peds  Hematology  (+) Blood dyscrasia, anemia ,   Anesthesia Other Findings   Reproductive/Obstetrics                            Anesthesia Physical  Anesthesia Plan  ASA: 2  Anesthesia Plan: Spinal   Post-op Pain Management: Regional block* and Celebrex PO (pre-op)*   Induction: Intravenous  PONV Risk Score and Plan: 2 and Ondansetron, Dexamethasone and Treatment may vary due to age or medical condition  Airway Management Planned: Simple Face Mask and Natural Airway  Additional Equipment:   Intra-op Plan:   Post-operative Plan:   Informed Consent: I have reviewed the patients History and Physical, chart, labs and discussed the procedure including the risks, benefits and alternatives for the proposed anesthesia with the patient or authorized representative who has indicated his/her understanding and acceptance.     Dental advisory given  Plan Discussed with: Anesthesiologist and CRNA  Anesthesia Plan Comments: (Extensive back surgery with instrumentation.  Previously, SAB successful after several attempts.  We try SAB but may need GA. Pt agrees.  )      Anesthesia Quick Evaluation

## 2022-07-05 ENCOUNTER — Observation Stay (HOSPITAL_COMMUNITY): Payer: Medicare HMO

## 2022-07-05 ENCOUNTER — Other Ambulatory Visit: Payer: Self-pay

## 2022-07-05 ENCOUNTER — Observation Stay (HOSPITAL_COMMUNITY)
Admission: RE | Admit: 2022-07-05 | Discharge: 2022-07-06 | Disposition: A | Discharge status: Skilled Nursing Facility | Payer: Medicare HMO | Attending: Orthopedic Surgery | Admitting: Orthopedic Surgery

## 2022-07-05 ENCOUNTER — Encounter (HOSPITAL_COMMUNITY): Payer: Self-pay | Admitting: Orthopedic Surgery

## 2022-07-05 ENCOUNTER — Encounter (HOSPITAL_COMMUNITY)
Admission: RE | Disposition: A | Discharge status: Skilled Nursing Facility | Payer: Self-pay | Source: Home / Self Care | Attending: Orthopedic Surgery

## 2022-07-05 ENCOUNTER — Ambulatory Visit (HOSPITAL_BASED_OUTPATIENT_CLINIC_OR_DEPARTMENT_OTHER): Payer: Medicare HMO | Admitting: Certified Registered Nurse Anesthetist

## 2022-07-05 ENCOUNTER — Ambulatory Visit (HOSPITAL_COMMUNITY): Payer: Medicare HMO

## 2022-07-05 ENCOUNTER — Ambulatory Visit (HOSPITAL_COMMUNITY): Payer: Medicare HMO | Admitting: Emergency Medicine

## 2022-07-05 DIAGNOSIS — Z01818 Encounter for other preprocedural examination: Secondary | ICD-10-CM

## 2022-07-05 DIAGNOSIS — M1611 Unilateral primary osteoarthritis, right hip: Secondary | ICD-10-CM | POA: Diagnosis not present

## 2022-07-05 DIAGNOSIS — Z853 Personal history of malignant neoplasm of breast: Secondary | ICD-10-CM | POA: Insufficient documentation

## 2022-07-05 DIAGNOSIS — Z87891 Personal history of nicotine dependence: Secondary | ICD-10-CM | POA: Diagnosis not present

## 2022-07-05 DIAGNOSIS — Z79899 Other long term (current) drug therapy: Secondary | ICD-10-CM | POA: Diagnosis not present

## 2022-07-05 DIAGNOSIS — Z96642 Presence of left artificial hip joint: Secondary | ICD-10-CM | POA: Diagnosis not present

## 2022-07-05 DIAGNOSIS — Z96641 Presence of right artificial hip joint: Secondary | ICD-10-CM | POA: Diagnosis not present

## 2022-07-05 DIAGNOSIS — Z471 Aftercare following joint replacement surgery: Secondary | ICD-10-CM | POA: Diagnosis not present

## 2022-07-05 HISTORY — PX: TOTAL HIP ARTHROPLASTY: SHX124

## 2022-07-05 SURGERY — ARTHROPLASTY, HIP, TOTAL, ANTERIOR APPROACH
Anesthesia: General | Site: Hip | Laterality: Right

## 2022-07-05 MED ORDER — GABAPENTIN 300 MG PO CAPS
300.0000 mg | ORAL_CAPSULE | Freq: Four times a day (QID) | ORAL | Status: DC
  Administered 2022-07-05 – 2022-07-06 (×4): 300 mg via ORAL
  Filled 2022-07-05 (×4): qty 1

## 2022-07-05 MED ORDER — PROPOFOL 10 MG/ML IV BOLUS
INTRAVENOUS | Status: DC | PRN
  Administered 2022-07-05: 80 mg via INTRAVENOUS
  Administered 2022-07-05 (×2): 25 mg via INTRAVENOUS

## 2022-07-05 MED ORDER — 0.9 % SODIUM CHLORIDE (POUR BTL) OPTIME
TOPICAL | Status: DC | PRN
  Administered 2022-07-05: 1000 mL

## 2022-07-05 MED ORDER — ONDANSETRON HCL 4 MG/2ML IJ SOLN
INTRAMUSCULAR | Status: AC
  Filled 2022-07-05: qty 2

## 2022-07-05 MED ORDER — MENTHOL 3 MG MT LOZG: 1.0000 | LOZENGE | OROMUCOSAL | Status: DC | PRN

## 2022-07-05 MED ORDER — PHENOL 1.4 % MT LIQD
1.0000 | OROMUCOSAL | Status: DC | PRN
Start: 2022-07-05 — End: 2022-07-06

## 2022-07-05 MED ORDER — DEXAMETHASONE SODIUM PHOSPHATE 10 MG/ML IJ SOLN
10.0000 mg | Freq: Once | INTRAMUSCULAR | Status: AC
  Administered 2022-07-06: 10 mg via INTRAVENOUS
  Filled 2022-07-05: qty 1

## 2022-07-05 MED ORDER — ORAL CARE MOUTH RINSE: 15.0000 mL | Freq: Once | OROMUCOSAL | Status: AC

## 2022-07-05 MED ORDER — HYDROCODONE-ACETAMINOPHEN 5-325 MG PO TABS: 1.0000 | ORAL_TABLET | ORAL | Status: DC | PRN

## 2022-07-05 MED ORDER — ROCURONIUM BROMIDE 100 MG/10ML IV SOLN
INTRAVENOUS | Status: DC | PRN
  Administered 2022-07-05: 60 mg via INTRAVENOUS

## 2022-07-05 MED ORDER — WATER FOR IRRIGATION, STERILE IR SOLN
Status: DC | PRN
  Administered 2022-07-05: 2000 mL

## 2022-07-05 MED ORDER — PROPOFOL 1000 MG/100ML IV EMUL
INTRAVENOUS | Status: AC
  Filled 2022-07-05: qty 100

## 2022-07-05 MED ORDER — MORPHINE SULFATE (PF) 2 MG/ML IV SOLN: 0.5000 mg | INTRAVENOUS | Status: DC | PRN

## 2022-07-05 MED ORDER — SUGAMMADEX SODIUM 200 MG/2ML IV SOLN
INTRAVENOUS | Status: DC | PRN
  Administered 2022-07-05: 200 mg via INTRAVENOUS

## 2022-07-05 MED ORDER — CEFAZOLIN SODIUM-DEXTROSE 2-4 GM/100ML-% IV SOLN
2.0000 g | Freq: Four times a day (QID) | INTRAVENOUS | Status: AC
  Administered 2022-07-05 (×2): 2 g via INTRAVENOUS
  Filled 2022-07-05 (×2): qty 100

## 2022-07-05 MED ORDER — DOCUSATE SODIUM 100 MG PO CAPS
100.0000 mg | ORAL_CAPSULE | Freq: Two times a day (BID) | ORAL | Status: DC
  Administered 2022-07-05 – 2022-07-06 (×3): 100 mg via ORAL
  Filled 2022-07-05 (×3): qty 1

## 2022-07-05 MED ORDER — METHOCARBAMOL 500 MG PO TABS: 500.0000 mg | ORAL_TABLET | Freq: Four times a day (QID) | ORAL | Status: DC | PRN

## 2022-07-05 MED ORDER — METOCLOPRAMIDE HCL 5 MG PO TABS: 5.0000 mg | ORAL_TABLET | Freq: Three times a day (TID) | ORAL | Status: DC | PRN

## 2022-07-05 MED ORDER — BUPIVACAINE-EPINEPHRINE 0.5% -1:200000 IJ SOLN
INTRAMUSCULAR | Status: DC | PRN
  Administered 2022-07-05: 30 mL

## 2022-07-05 MED ORDER — FENTANYL CITRATE (PF) 100 MCG/2ML IJ SOLN
INTRAMUSCULAR | Status: AC
  Filled 2022-07-05: qty 2

## 2022-07-05 MED ORDER — PROMETHAZINE HCL 25 MG/ML IJ SOLN: 6.2500 mg | INTRAMUSCULAR | Status: DC | PRN

## 2022-07-05 MED ORDER — SODIUM CHLORIDE 0.9 % IV SOLN: INTRAVENOUS | Status: DC

## 2022-07-05 MED ORDER — FENTANYL CITRATE PF 50 MCG/ML IJ SOSY
25.0000 ug | PREFILLED_SYRINGE | INTRAMUSCULAR | Status: DC | PRN
  Administered 2022-07-05 (×2): 50 ug via INTRAVENOUS

## 2022-07-05 MED ORDER — FENTANYL CITRATE PF 50 MCG/ML IJ SOSY
PREFILLED_SYRINGE | INTRAMUSCULAR | Status: AC
  Filled 2022-07-05: qty 1

## 2022-07-05 MED ORDER — DEXAMETHASONE SODIUM PHOSPHATE 10 MG/ML IJ SOLN
8.0000 mg | Freq: Once | INTRAMUSCULAR | Status: AC
  Administered 2022-07-05: 8 mg via INTRAVENOUS

## 2022-07-05 MED ORDER — EPHEDRINE SULFATE-NACL 50-0.9 MG/10ML-% IV SOSY
PREFILLED_SYRINGE | INTRAVENOUS | Status: DC | PRN
  Administered 2022-07-05 (×2): 5 mg via INTRAVENOUS

## 2022-07-05 MED ORDER — RIVAROXABAN 10 MG PO TABS
10.0000 mg | ORAL_TABLET | Freq: Every day | ORAL | Status: DC
  Administered 2022-07-06: 10 mg via ORAL
  Filled 2022-07-05: qty 1

## 2022-07-05 MED ORDER — METHOCARBAMOL 1000 MG/10ML IJ SOLN: 500.0000 mg | Freq: Four times a day (QID) | INTRAVENOUS | Status: DC | PRN

## 2022-07-05 MED ORDER — LACTATED RINGERS IV SOLN: INTRAVENOUS | Status: DC

## 2022-07-05 MED ORDER — BUPIVACAINE-EPINEPHRINE (PF) 0.5% -1:200000 IJ SOLN
INTRAMUSCULAR | Status: AC
  Filled 2022-07-05: qty 30

## 2022-07-05 MED ORDER — EPHEDRINE 5 MG/ML INJ
INTRAVENOUS | Status: AC
  Filled 2022-07-05: qty 5

## 2022-07-05 MED ORDER — ACETAMINOPHEN 10 MG/ML IV SOLN
1000.0000 mg | Freq: Four times a day (QID) | INTRAVENOUS | Status: DC
  Administered 2022-07-05: 1000 mg via INTRAVENOUS
  Filled 2022-07-05: qty 100

## 2022-07-05 MED ORDER — CEFAZOLIN SODIUM-DEXTROSE 2-4 GM/100ML-% IV SOLN
2.0000 g | INTRAVENOUS | Status: AC
  Administered 2022-07-05: 2 g via INTRAVENOUS
  Filled 2022-07-05: qty 100

## 2022-07-05 MED ORDER — BISACODYL 10 MG RE SUPP: 10.0000 mg | Freq: Every day | RECTAL | Status: DC | PRN

## 2022-07-05 MED ORDER — POVIDONE-IODINE 10 % EX SWAB
2.0000 | Freq: Once | CUTANEOUS | Status: AC
  Administered 2022-07-05: 2 via TOPICAL

## 2022-07-05 MED ORDER — TRANEXAMIC ACID-NACL 1000-0.7 MG/100ML-% IV SOLN
1000.0000 mg | INTRAVENOUS | Status: AC
  Administered 2022-07-05: 1000 mg via INTRAVENOUS
  Filled 2022-07-05: qty 100

## 2022-07-05 MED ORDER — PHENYLEPHRINE 80 MCG/ML (10ML) SYRINGE FOR IV PUSH (FOR BLOOD PRESSURE SUPPORT)
PREFILLED_SYRINGE | INTRAVENOUS | Status: AC
  Filled 2022-07-05: qty 10

## 2022-07-05 MED ORDER — CHLORHEXIDINE GLUCONATE 0.12 % MT SOLN
15.0000 mL | Freq: Once | OROMUCOSAL | Status: AC
  Administered 2022-07-05: 15 mL via OROMUCOSAL

## 2022-07-05 MED ORDER — DEXAMETHASONE SODIUM PHOSPHATE 10 MG/ML IJ SOLN
INTRAMUSCULAR | Status: AC
  Filled 2022-07-05: qty 1

## 2022-07-05 MED ORDER — PANTOPRAZOLE SODIUM 40 MG PO TBEC
40.0000 mg | DELAYED_RELEASE_TABLET | Freq: Every day | ORAL | Status: DC
  Administered 2022-07-06: 40 mg via ORAL
  Filled 2022-07-05: qty 1

## 2022-07-05 MED ORDER — ROCURONIUM BROMIDE 10 MG/ML (PF) SYRINGE
PREFILLED_SYRINGE | INTRAVENOUS | Status: AC
  Filled 2022-07-05: qty 10

## 2022-07-05 MED ORDER — FENTANYL CITRATE (PF) 100 MCG/2ML IJ SOLN
INTRAMUSCULAR | Status: DC | PRN
  Administered 2022-07-05 (×3): 50 ug via INTRAVENOUS
  Administered 2022-07-05: 100 ug via INTRAVENOUS
  Administered 2022-07-05: 50 ug via INTRAVENOUS

## 2022-07-05 MED ORDER — AMISULPRIDE (ANTIEMETIC) 5 MG/2ML IV SOLN: 10.0000 mg | Freq: Once | INTRAVENOUS | Status: DC | PRN

## 2022-07-05 MED ORDER — ONDANSETRON HCL 4 MG/2ML IJ SOLN
INTRAMUSCULAR | Status: DC | PRN
  Administered 2022-07-05: 4 mg via INTRAVENOUS

## 2022-07-05 MED ORDER — METOCLOPRAMIDE HCL 5 MG/ML IJ SOLN: 5.0000 mg | Freq: Three times a day (TID) | INTRAMUSCULAR | Status: DC | PRN

## 2022-07-05 MED ORDER — MAGNESIUM CITRATE PO SOLN: 1.0000 | Freq: Once | ORAL | Status: DC | PRN

## 2022-07-05 MED ORDER — ONDANSETRON HCL 4 MG PO TABS: 4.0000 mg | ORAL_TABLET | Freq: Four times a day (QID) | ORAL | Status: DC | PRN

## 2022-07-05 MED ORDER — POLYETHYLENE GLYCOL 3350 17 G PO PACK: 17.0000 g | PACK | Freq: Every day | ORAL | Status: DC | PRN

## 2022-07-05 MED ORDER — MONTELUKAST SODIUM 10 MG PO TABS: 10.0000 mg | ORAL_TABLET | Freq: Every day | ORAL | Status: DC

## 2022-07-05 MED ORDER — HYDROCODONE-ACETAMINOPHEN 7.5-325 MG PO TABS
1.0000 | ORAL_TABLET | ORAL | Status: DC | PRN
  Administered 2022-07-05 – 2022-07-06 (×4): 2 via ORAL
  Administered 2022-07-06 (×2): 1 via ORAL
  Filled 2022-07-05 (×2): qty 2
  Filled 2022-07-05: qty 1
  Filled 2022-07-05 (×2): qty 2
  Filled 2022-07-05: qty 1

## 2022-07-05 MED ORDER — ONDANSETRON HCL 4 MG/2ML IJ SOLN: 4.0000 mg | Freq: Four times a day (QID) | INTRAMUSCULAR | Status: DC | PRN

## 2022-07-05 MED ORDER — PHENYLEPHRINE 80 MCG/ML (10ML) SYRINGE FOR IV PUSH (FOR BLOOD PRESSURE SUPPORT)
PREFILLED_SYRINGE | INTRAVENOUS | Status: DC | PRN
  Administered 2022-07-05: 120 ug via INTRAVENOUS
  Administered 2022-07-05: 80 ug via INTRAVENOUS

## 2022-07-05 MED ORDER — ACETAMINOPHEN 325 MG PO TABS: 325.0000 mg | ORAL_TABLET | Freq: Four times a day (QID) | ORAL | Status: DC | PRN

## 2022-07-05 MED ORDER — CELECOXIB 200 MG PO CAPS
200.0000 mg | ORAL_CAPSULE | Freq: Once | ORAL | Status: AC
  Administered 2022-07-05: 200 mg via ORAL
  Filled 2022-07-05: qty 1

## 2022-07-05 MED ORDER — TRAMADOL HCL 50 MG PO TABS: 50.0000 mg | ORAL_TABLET | Freq: Four times a day (QID) | ORAL | Status: DC | PRN

## 2022-07-05 SURGICAL SUPPLY — 47 items
BAG COUNTER SPONGE SURGICOUNT (BAG) ×1 IMPLANT
BAG DECANTER FOR FLEXI CONT (MISCELLANEOUS) IMPLANT
BAG SPEC THK2 15X12 ZIP CLS (MISCELLANEOUS)
BAG SPNG CNTER NS LX DISP (BAG) ×1
BAG ZIPLOCK 12X15 (MISCELLANEOUS) IMPLANT
BLADE SAG 18X100X1.27 (BLADE) ×2 IMPLANT
CLOSURE STERI STRIP 1/2 X4 (GAUZE/BANDAGES/DRESSINGS) ×2 IMPLANT
COVER PERINEAL POST (MISCELLANEOUS) ×2 IMPLANT
COVER SURGICAL LIGHT HANDLE (MISCELLANEOUS) ×2 IMPLANT
CUP ACETBLR 52 OD PINNACLE (Hips) ×1 IMPLANT
DRAPE FOOT SWITCH (DRAPES) ×2 IMPLANT
DRAPE STERI IOBAN 125X83 (DRAPES) ×2 IMPLANT
DRAPE U-SHAPE 47X51 STRL (DRAPES) ×4 IMPLANT
DRSG AQUACEL AG ADV 3.5X10 (GAUZE/BANDAGES/DRESSINGS) ×2 IMPLANT
DURAPREP 26ML APPLICATOR (WOUND CARE) ×2 IMPLANT
ELECT REM PT RETURN 15FT ADLT (MISCELLANEOUS) ×2 IMPLANT
GLOVE BIO SURGEON STRL SZ 6.5 (GLOVE) ×1 IMPLANT
GLOVE BIO SURGEON STRL SZ7.5 (GLOVE) IMPLANT
GLOVE BIO SURGEON STRL SZ8 (GLOVE) ×2 IMPLANT
GLOVE BIOGEL PI IND STRL 6.5 (GLOVE) IMPLANT
GLOVE BIOGEL PI IND STRL 7.0 (GLOVE) IMPLANT
GLOVE BIOGEL PI IND STRL 8 (GLOVE) ×1 IMPLANT
GLOVE BIOGEL PI INDICATOR 6.5 (GLOVE) ×2
GLOVE BIOGEL PI INDICATOR 7.0 (GLOVE)
GLOVE BIOGEL PI INDICATOR 8 (GLOVE) ×2
GOWN STRL REUS W/ TWL LRG LVL3 (GOWN DISPOSABLE) ×1 IMPLANT
GOWN STRL REUS W/ TWL XL LVL3 (GOWN DISPOSABLE) IMPLANT
GOWN STRL REUS W/TWL LRG LVL3 (GOWN DISPOSABLE) ×2
GOWN STRL REUS W/TWL XL LVL3 (GOWN DISPOSABLE)
HEAD FEM STD 32X+5 STRL (Hips) ×1 IMPLANT
HOLDER FOLEY CATH W/STRAP (MISCELLANEOUS) ×2 IMPLANT
KIT TURNOVER KIT A (KITS) ×1 IMPLANT
LINER MARATHON NEUT +4X52X32 (Hips) ×1 IMPLANT
MANIFOLD NEPTUNE II (INSTRUMENTS) ×2 IMPLANT
PACK ANTERIOR HIP CUSTOM (KITS) ×2 IMPLANT
PENCIL SMOKE EVACUATOR COATED (MISCELLANEOUS) ×2 IMPLANT
SPIKE FLUID TRANSFER (MISCELLANEOUS) ×2 IMPLANT
STEM FEMORAL SZ 5MM STD ACTIS (Stem) ×1 IMPLANT
STRIP CLOSURE SKIN 1/2X4 (GAUZE/BANDAGES/DRESSINGS) ×2 IMPLANT
SUT ETHIBOND NAB CT1 #1 30IN (SUTURE) ×2 IMPLANT
SUT MNCRL AB 4-0 PS2 18 (SUTURE) ×2 IMPLANT
SUT STRATAFIX 0 PDS 27 VIOLET (SUTURE) ×2
SUT VIC AB 2-0 CT1 27 (SUTURE) ×4
SUT VIC AB 2-0 CT1 TAPERPNT 27 (SUTURE) ×2 IMPLANT
SUTURE STRATFX 0 PDS 27 VIOLET (SUTURE) ×1 IMPLANT
TRAY FOLEY MTR SLVR 16FR STAT (SET/KITS/TRAYS/PACK) ×2 IMPLANT
TUBE SUCTION HIGH CAP CLEAR NV (SUCTIONS) ×2 IMPLANT

## 2022-07-05 NOTE — TOC Initial Note (Signed)
Transition of Care [facility]) - Initial/Assessment Note    Patient Details  Name: Elizabeth [name] MRN: [id number] Date of Birth: April 21, 1941  Transition of [facility]) CM/SW Contact:    Lennart Pall, LCSW Phone Number: 07/05/2022, 2:22 PM  Clinical Narrative:                 Met with pt and spouse to discuss dc plans.  They [place] live [place] apt at [facility] with plan for pt to dc from hospital to the rehab at [facility] prior to returning to [place] just as they did following her surgery in Feb.  No concerns.  Have left a VM for admissions liaison at [facility] and will assist with discharge coordination.  Expected Discharge Plan: Skilled Nursing Facility Barriers to Discharge: No Barriers Identified   Patient Goals and CMS Choice Patient states their goals for this hospitalization and ongoing recovery are:: return to SNF then to her ILF apt      Expected Discharge Plan and Services Expected Discharge Plan: [place] arrangements for the past 2 months: [place]                 DME Arranged: N/A DME Agency: NA                  Prior Living Arrangements/Services Living arrangements for the past 2 months: [place]   Patient language and need for interpreter reviewed:: Yes Do you feel safe going back to the place where you live?: Yes      Need for Family Participation in Patient Care: No (Comment) Care giver support system in place?: Yes (comment)   Criminal Activity/Legal Involvement Pertinent to Current Situation/Hospitalization: No - Comment as needed  Activities of Daily Living Home Assistive Devices/Equipment: Other (Comment), Bedside commode/3-in-1, Hearing aid, Eyeglasses (rollater walker) ADL Screening (condition at time of admission) Patient's cognitive ability adequate to safely complete daily activities?: Yes Is the patient deaf or have difficulty hearing?: Yes Does the patient have  difficulty seeing, even when wearing glasses/contacts?: No Does the patient have difficulty concentrating, remembering, or making decisions?: No Patient able to express need for assistance with ADLs?: Yes Does the patient have difficulty dressing or bathing?: No Independently performs ADLs?: Yes (appropriate for developmental age) Does the patient have difficulty walking or climbing stairs?: No Weakness of Legs: None Weakness of Arms/Hands: None  Permission Sought/Granted Permission sought to share information with : Facility Sport and exercise psychologist, Family Supports Permission granted to share information with : Yes, Verbal Permission Granted              Emotional Assessment Appearance:: Appears stated age Attitude/Demeanor/Rapport: Gracious Affect (typically observed): Accepting Orientation: : Oriented to Self, Oriented to Place, Oriented to  Time, Oriented to Situation Alcohol / Substance Use: Not Applicable Psych Involvement: No (comment)  Admission diagnosis:  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.11] Patient Active Problem List   Diagnosis Date Noted   Primary osteoarthritis of right hip 07/05/2022   Blood loss anemia 01/06/2022   OA (osteoarthritis) of hip 12/21/2021   S/P total left hip arthroplasty 12/21/2021   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   Hx of breast cancer 08/12/2021   Pain of left hip joint 01/24/2021   Dysphagia 11/02/2020   Diarrhea 11/02/2020   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

## 2022-07-05 NOTE — Discharge Instructions (Addendum)
Elizabeth Aluisio, MD Total Joint Specialist [facility] [address] [place], [place] [address] [phone]  ANTERIOR APPROACH TOTAL HIP REPLACEMENT POSTOPERATIVE DIRECTIONS     Hip Rehabilitation, Guidelines Following Surgery  The results of a hip operation are greatly improved after range of motion and muscle strengthening exercises. Follow all safety measures which are given to protect your hip. If any of these exercises cause increased pain or swelling in your joint, decrease the amount until you are comfortable again. Then slowly increase the exercises. Call your caregiver if you have problems or questions.   BLOOD CLOT PREVENTION Take a 10 mg Xarelto once a day for three weeks following surgery. Then take an 81 mg Aspirin once a day for three weeks. Then discontinue Aspirin. You may resume your vitamins/supplements once you have discontinued the Xarelto. Do not take any NSAIDs (Advil, Aleve, Ibuprofen, Meloxicam, etc.) until you have discontinued the Xarelto.   HOME CARE INSTRUCTIONS  Remove items at home which could result in a fall. This includes throw rugs or furniture in walking pathways.  ICE to the affected hip as frequently as 20-30 minutes an hour and then as needed for pain and swelling. Continue to use ice on the hip for pain and swelling from surgery. You may notice swelling that will progress down to the foot and ankle. This is normal after surgery. Elevate the leg when you are not up walking on it.   Continue to use the breathing machine which will help keep your temperature down.  It is common for your temperature to cycle up and down following surgery, especially at night when you are not up moving around and exerting yourself.  The breathing machine keeps your lungs expanded and your temperature down.  DIET You may resume your previous home diet once your are discharged from the hospital.  DRESSING / WOUND CARE / SHOWERING You have an  adhesive waterproof bandage over the incision. Leave this in place until your first follow-up appointment. Once you remove this you will not need to place another bandage.  You may begin showering 3 days following surgery, but do not submerge the incision under water.  ACTIVITY For the first 3-5 days, it is important to rest and keep the operative leg elevated. You should, as a general rule, rest for 50 minutes and walk/stretch for 10 minutes per hour. After 5 days, you may slowly increase activity as tolerated.  Perform the exercises you were provided twice a day for about 15-20 minutes each session. Begin these 2 days following surgery. Walk with your walker as instructed. Use the walker until you are comfortable transitioning to a cane. Walk with the cane in the opposite hand of the operative leg. You may discontinue the cane once you are comfortable and walking steadily. Avoid periods of inactivity such as sitting longer than an hour when not asleep. This helps prevent blood clots.  Do not drive a car for 6 weeks or until released by your surgeon.  Do not drive while taking narcotics.  TED HOSE STOCKINGS Wear the elastic stockings on both legs for three weeks following surgery during the day. You may remove them at night while sleeping.  WEIGHT BEARING Weight bearing as tolerated with assist device (walker, cane, etc) as directed, use it as long as suggested by your surgeon or therapist, typically at least 4-6 weeks.  POSTOPERATIVE CONSTIPATION PROTOCOL Constipation - defined medically as fewer than three stools per week and severe constipation as less   less than one stool per week. ° °One of the most common issues patients have following surgery is constipation.  Even if you have a regular bowel pattern at home, your normal regimen is likely to be disrupted due to multiple reasons following surgery.  Combination of anesthesia, postoperative narcotics, change in appetite and fluid intake all can  affect your bowels.  In order to avoid complications following surgery, here are some recommendations in order to help you during your recovery period. ° °Colace (docusate) - Pick up an over-the-counter form of Colace or another stool softener and take twice a day as long as you are requiring postoperative pain medications.  Take with a full glass of water daily.  If you experience loose stools or diarrhea, hold the colace until you stool forms back up.  If your symptoms do not get better within 1 week or if they get worse, check with your doctor. °Dulcolax (bisacodyl) - Pick up over-the-counter and take as directed by the product packaging as needed to assist with the movement of your bowels.  Take with a full glass of water.  Use this product as needed if not relieved by Colace only.  °MiraLax (polyethylene glycol) - Pick up over-the-counter to have on hand.  MiraLax is a solution that will increase the amount of water in your bowels to assist with bowel movements.  Take as directed and can mix with a glass of water, juice, soda, coffee, or tea.  Take if you go more than two days without a movement.Do not use MiraLax more than once per day. Call your doctor if you are still constipated or irregular after using this medication for 7 days in a row. ° °If you continue to have problems with postoperative constipation, please contact the office for further assistance and recommendations.  If you experience "the worst abdominal pain ever" or develop nausea or vomiting, please contact the office immediatly for further recommendations for treatment. ° °ITCHING ° If you experience itching with your medications, try taking only a single pain pill, or even half a pain pill at a time.  You can also use Benadryl over the counter for itching or also to help with sleep.  ° °MEDICATIONS °See your medication summary on the “After Visit Summary” that the nursing staff will review with you prior to discharge.  You may have some home  medications which will be placed on hold until you complete the course of blood thinner medication.  It is important for you to complete the blood thinner medication as prescribed by your surgeon.  Continue your approved medications as instructed at time of discharge. ° °PRECAUTIONS °If you experience chest pain or shortness of breath - call 911 immediately for transfer to the hospital emergency department.  °If you develop a fever greater that 101 F, purulent drainage from wound, increased redness or drainage from wound, foul odor from the wound/dressing, or calf pain - CONTACT YOUR SURGEON.   °                                                °FOLLOW-UP APPOINTMENTS °Make sure you keep all of your appointments after your operation with your surgeon and caregivers. You should call the office at the above phone number and make an appointment for approximately two weeks after the date of your surgery or on the   date instructed by your surgeon outlined in the "After Visit Summary". ° °RANGE OF MOTION AND STRENGTHENING EXERCISES  °These exercises are designed to help you keep full movement of your hip joint. Follow your caregiver's or physical therapist's instructions. Perform all exercises about fifteen times, three times per day or as directed. Exercise both hips, even if you have had only one joint replacement. These exercises can be done on a training (exercise) mat, on the floor, on a table or on a bed. Use whatever works the best and is most comfortable for you. Use music or television while you are exercising so that the exercises are a pleasant break in your day. This will make your life better with the exercises acting as a break in routine you can look forward to.  °Lying on your back, slowly slide your foot toward your buttocks, raising your knee up off the floor. Then slowly slide your foot back down until your leg is straight again.  °Lying on your back spread your legs as far apart as you can without causing  discomfort.  °Lying on your side, raise your upper leg and foot straight up from the floor as far as is comfortable. Slowly lower the leg and repeat.  °Lying on your back, tighten up the muscle in the front of your thigh (quadriceps muscles). You can do this by keeping your leg straight and trying to raise your heel off the floor. This helps strengthen the largest muscle supporting your knee.  °Lying on your back, tighten up the muscles of your buttocks both with the legs straight and with the knee bent at a comfortable angle while keeping your heel on the floor.  ° °POST-OPERATIVE OPIOID TAPER INSTRUCTIONS: °It is important to wean off of your opioid medication as soon as possible. If you do not need pain medication after your surgery it is ok to stop day one. °Opioids include: °Codeine, Hydrocodone(Norco, Vicodin), Oxycodone(Percocet, oxycontin) and hydromorphone amongst others.  °Long term and even short term use of opiods can cause: °Increased pain response °Dependence °Constipation °Depression °Respiratory depression °And more.  °Withdrawal symptoms can include °Flu like symptoms °Nausea, vomiting °And more °Techniques to manage these symptoms °Hydrate well °Eat regular healthy meals °Stay active °Use relaxation techniques(deep breathing, meditating, yoga) °Do Not substitute Alcohol to help with tapering °If you have been on opioids for less than two weeks and do not have pain than it is ok to stop all together.  °Plan to wean off of opioids °This plan should start within one week post op of your joint replacement. °Maintain the same interval or time between taking each dose and first decrease the dose.  °Cut the total daily intake of opioids by one tablet each day °Next start to increase the time between doses. °The last dose that should be eliminated is the evening dose.  ° °IF YOU ARE TRANSFERRED TO A SKILLED REHAB FACILITY °If the patient is transferred to a skilled rehab facility following release from the  hospital, a list of the current medications will be sent to the facility for the patient to continue.  When discharged from the skilled rehab facility, please have the facility set up the patient's Home Health Physical Therapy prior to being released. Also, the skilled facility will be responsible for providing the patient with their medications at time of release from the facility to include their pain medication, the muscle relaxants, and their blood thinner medication. If the patient is still at the rehab facility   time of the two week follow up appointment, the skilled rehab facility will also need to assist the patient in arranging follow up appointment in our office and any transportation needs.  MAKE SURE YOU:  Understand these instructions.  Get help right away if you are not doing well or get worse.    DENTAL ANTIBIOTICS:  In most cases prophylactic antibiotics for Dental procdeures after total joint surgery are not necessary.  Exceptions are as follows:  1. History of prior total joint infection  2. Severely immunocompromised (Organ Transplant, cancer chemotherapy, Rheumatoid biologic meds such as Humera)  3. Poorly controlled diabetes (A1C &gt; 8.0, blood glucose over 200)  If you have one of these conditions, contact your surgeon for an antibiotic prescription, prior to your dental procedure.    Pick up stool softner and laxative for home use following surgery while on pain medications. Do not submerge incision under water. Please use good hand washing techniques while changing dressing each day. May shower starting three days after surgery. Please use a clean towel to pat the incision dry following showers. Continue to use ice for pain and swelling after surgery. Do not use any lotions or creams on the incision until instructed by your surgeon. ________________________________________________________  Information on my medicine - XARELTO (Rivaroxaban)  This  medication education was reviewed with me or my healthcare representative as part of my discharge preparation.    Why was Xarelto prescribed for you? Xarelto was prescribed for you to reduce the risk of blood clots forming after orthopedic surgery. The medical term for these abnormal blood clots is venous thromboembolism (VTE).  What do you need to know about xarelto ? Take your Xarelto ONCE DAILY at the same time every day. You may take it either with or without food.  If you have difficulty swallowing the tablet whole, you may crush it and mix in applesauce just prior to taking your dose.  Take Xarelto exactly as prescribed by your doctor and DO NOT stop taking Xarelto without talking to the doctor who prescribed the medication.  Stopping without other VTE prevention medication to take the place of Xarelto may increase your risk of developing a clot.  After discharge, you should have regular check-up appointments with your healthcare provider that is prescribing your Xarelto.    What do you do if you miss a dose? If you miss a dose, take it as soon as you remember on the same day then continue your regularly scheduled once daily regimen the next day. Do not take two doses of Xarelto on the same day.   Important Safety Information A possible side effect of Xarelto is bleeding. You should call your healthcare provider right away if you experience any of the following: Bleeding from an injury or your nose that does not stop. Unusual colored urine (red or dark brown) or unusual colored stools (red or black). Unusual bruising for unknown reasons. A serious fall or if you hit your head (even if there is no bleeding).  Some medicines may interact with Xarelto and might increase your risk of bleeding while on Xarelto. To help avoid this, consult your healthcare provider or pharmacist prior to using any new prescription or non-prescription medications, including herbals, vitamins,  non-steroidal anti-inflammatory drugs (NSAIDs) and supplements.  This website has more information on Xarelto: www.xarelto.com.   

## 2022-07-05 NOTE — Evaluation (Signed)
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   Clinical Impression  Elizabeth [name] is a 81 y.o. female POD 0 s/p R-THA, AA. Patient reports modified independence using rollator for mobility at baseline. Patient is now limited by functional impairments (see PT problem list below) and requires min assist for bed mobility and min guard for transfers. Patient was able to ambulate 15 feet with RW and min guard level of assist. Patient instructed in exercise to facilitate ROM and circulation to manage edema. Provided incentive spirometer and with Vcs pt able to achieve 2560m. Patient will benefit from continued skilled PT interventions to address impairments and progress towards PLOF. Acute PT will follow to progress mobility and HEP in preparation for safe discharge to rehab center at [facility]       Recommendations for follow up therapy are one component of a multi-disciplinary discharge planning process, led by the attending physician.  Recommendations may be updated based on patient status, additional functional criteria and insurance authorization.  Follow Up Recommendations Follow physician's recommendations for discharge plan and follow up therapies      Assistance Recommended at Discharge Intermittent Supervision/Assistance  Patient can return home with the following  A little help with walking and/or transfers;A little help with bathing/dressing/bathroom;Assistance with cooking/housework;Assist for transportation;Help with stairs or ramp for entrance    Equipment Recommendations Rolling walker (2 wheels) (Pt has rollator but would benefit from [place]  Recommendations for Other Services        Functional Status Assessment Patient has had a recent decline in their functional status and demonstrates the ability to make significant improvements in function in a reasonable and predictable amount of time.     Precautions / Restrictions Precautions Precautions: Fall Restrictions Weight Bearing Restrictions: No Other Position/Activity Restrictions: wbat      Mobility  Bed Mobility Overal bed mobility: Needs Assistance Bed Mobility: Supine to Sit     Supine to sit: HOB elevated, Min assist     General bed mobility comments: Min assist to scoot hips EOB via pad, otherwise min guard    Transfers Overall transfer level: Needs assistance Equipment used: Rolling walker (2 wheels) Transfers: Sit to/from Stand Sit to Stand: Min guard           General transfer comment: Min guard for safety, VCs for sequencing    Ambulation/Gait Ambulation/Gait assistance: Min guard, +2 safety/equipment Gait Distance (Feet): 15 Feet Assistive device: Rolling walker (2 wheels) Gait Pattern/deviations: Step-to pattern Gait velocity: decreased     General Gait Details: Pt ambulated with RW and min guard +2 for recliner follow for safety, no physical assist required or overt LOB noted.  Stairs            Wheelchair Mobility    Modified Rankin (Stroke Patients Only)       Balance Overall balance assessment: Needs assistance Sitting-balance support: Feet supported, No upper extremity supported Sitting balance-Leahy Scale: Good     Standing balance support: Reliant on assistive device for balance, During functional activity, Bilateral upper extremity supported Standing balance-Leahy Scale: Poor  Pertinent Vitals/Pain Pain Assessment Pain Assessment: No/denies pain    Home Living Family/patient expects to be discharged to:: Private residence Living Arrangements: Spouse/significant other Available Help at Discharge:  Family;Available 24 hours/day Type of Home: Independent living facility Home Access: Level entry       Home Layout: One level Home Equipment: Grab bars - toilet;Grab bars - tub/shower;Rollator (4 wheels)      Prior Function Prior Level of Function : Independent/Modified Independent             Mobility Comments: uses rollator at end of the day, pilates 3x weeks ADLs Comments: ind     Hand Dominance        Extremity/Trunk Assessment   Upper Extremity Assessment Upper Extremity Assessment: Overall WFL for tasks assessed    Lower Extremity Assessment Lower Extremity Assessment: RLE deficits/detail;LLE deficits/detail RLE Deficits / Details: MMT ank DF/PF 5/5 RLE Sensation: WNL LLE Deficits / Details: MMT ank DF/PF 5/5 LLE Sensation: WNL    Cervical / Trunk Assessment Cervical / Trunk Assessment: Back Surgery (Spinal fusion)  Communication   Communication: No difficulties  Cognition Arousal/Alertness: Awake/alert Behavior During Therapy: WFL for tasks assessed/performed Overall Cognitive Status: Within Functional Limits for tasks assessed                                          General Comments General comments (skin integrity, edema, etc.): Husband Sonia Side present    Exercises Total Joint Exercises Ankle Circles/Pumps: AROM, Both, 10 reps   Assessment/Plan    PT Assessment Patient needs continued PT services  PT Problem List Decreased strength;Decreased range of motion;Decreased activity tolerance;Decreased balance;Decreased mobility;Decreased coordination;Pain       PT Treatment Interventions DME instruction;Gait training;Stair training;Functional mobility training;Therapeutic activities;Therapeutic exercise;Balance training;Neuromuscular re-education;Patient/family education    PT Goals (Current goals can be found in the Care Plan section)  Acute Rehab PT Goals Patient Stated Goal: To continue pilates and work on chronic pain. PT Goal  Formulation: With patient/family Time For Goal Achievement: 07/12/22 Potential to Achieve Goals: Good    Frequency 7X/week     Co-evaluation               AM-PAC PT "6 Clicks" Mobility  Outcome Measure Help needed turning from your back to your side while in a flat bed without using bedrails?: A Little Help needed moving from lying on your back to sitting on the side of a flat bed without using bedrails?: A Little Help needed moving to and from a bed to a chair (including a wheelchair)?: A Little Help needed standing up from a chair using your arms (e.g., wheelchair or bedside chair)?: A Little Help needed to walk in hospital room?: A Little Help needed climbing 3-5 steps with a railing? : A Little 6 Click Score: 18    End of Session Equipment Utilized During Treatment: Gait belt Activity Tolerance: Patient tolerated treatment well;No increased pain Patient left: in chair;with call bell/phone within reach;with chair alarm set;with family/visitor present;with SCD's reapplied Nurse Communication: Mobility status PT Visit Diagnosis: Pain;Difficulty in walking, not elsewhere classified (R26.2) Pain - Right/Left: Right Pain - part of body: Hip    Time: 4259-5638 PT Time Calculation (min) (ACUTE ONLY): 18 min   Charges:   PT Evaluation $PT Eval Low Complexity: [place], PT, DPT [facility] Department Office:  [place] Pager: [phone]  Coolidge Breeze 07/05/2022, 5:46 PM

## 2022-07-05 NOTE — Plan of Care (Signed)
  Problem: Education: Goal: Knowledge of the prescribed therapeutic regimen will improve Outcome: Progressing   Problem: Pain Management: Goal: Pain level will decrease with appropriate interventions Outcome: Progressing   Problem: Activity: Goal: Risk for activity intolerance will decrease Outcome: Progressing   

## 2022-07-05 NOTE — Interval H&P Note (Signed)
History and Physical Interval Note:  07/05/2022 8:13 AM  Elizabeth [name]  has presented today for surgery, with the diagnosis of Right hip osteoarthritis.  The various methods of treatment have been discussed with the patient and family. After consideration of risks, benefits and other options for treatment, the patient has consented to  Procedure(s): TOTAL HIP ARTHROPLASTY ANTERIOR APPROACH (Right) as a surgical intervention.  The patient's history has been reviewed, patient examined, no change in status, stable for surgery.  I have reviewed the patient's chart and labs.  Questions were answered to the patient's satisfaction.     Pilar Plate Odie Rauen

## 2022-07-05 NOTE — Transfer of Care (Signed)
Immediate Anesthesia Transfer of Care Note  Patient: Elizabeth [name]  Procedure(s) Performed: TOTAL HIP ARTHROPLASTY ANTERIOR APPROACH (Right: Hip)  Patient Location: PACU  Anesthesia Type:General  Level of Consciousness: awake and patient cooperative  Airway & Oxygen Therapy: Patient Spontanous Breathing and Patient connected to face mask  Post-op Assessment: Report given to RN and Post -op Vital signs reviewed and stable  Post vital signs: Reviewed and stable  Last Vitals:  Vitals Value Taken Time  BP 150/72 07/05/22 1118  Temp    Pulse 81 07/05/22 1121  Resp 17 07/05/22 1121  SpO2 100 % 07/05/22 1121  Vitals shown include unvalidated device data.  Last Pain:  Vitals:   07/05/22 0820  TempSrc:   PainSc: 1       Patients Stated Pain Goal: 4 (45/03/88 8280)  Complications: No notable events documented.

## 2022-07-05 NOTE — Op Note (Signed)
OPERATIVE REPORT- TOTAL HIP ARTHROPLASTY   PREOPERATIVE DIAGNOSIS: Osteoarthritis of the Right hip.   POSTOPERATIVE DIAGNOSIS: Osteoarthritis of the Right  hip.   PROCEDURE: Right total hip arthroplasty, anterior approach.   SURGEON: Gaynelle Arabian, MD   ASSISTANT: Jaynie Bream, PA-C  ANESTHESIA: General  ESTIMATED BLOOD LOSS:-200 mL    DRAINS: None  COMPLICATIONS: None   CONDITION: PACU - hemodynamically stable.   BRIEF CLINICAL NOTE: Elizabeth [name] is a 81 y.o. female who has advanced end-  stage arthritis of their Right  hip with progressively worsening pain and  dysfunction.The patient has failed nonoperative management and presents for  total hip arthroplasty.   PROCEDURE IN DETAIL: After successful administration of spinal  anesthetic, the traction boots for the [facility] bed were placed on both  feet and the patient was placed onto the [facility] bed, boots placed into the leg  holders. The Right hip was then isolated from the perineum with plastic  drapes and prepped and draped in the usual sterile fashion. ASIS and  greater trochanter were marked and a oblique incision was made, starting  at about 1 cm lateral and 2 cm distal to the ASIS and coursing towards  the anterior cortex of the femur. The skin was cut with a 10 blade  through subcutaneous tissue to the level of the fascia overlying the  tensor fascia lata muscle. The fascia was then incised in line with the  incision at the junction of the anterior third and posterior 2/3rd. The  muscle was teased off the fascia and then the interval between the TFL  and the rectus was developed. The Hohmann retractor was then placed at  the top of the femoral neck over the capsule. The vessels overlying the  capsule were cauterized and the fat on top of the capsule was removed.  A Hohmann retractor was then placed anterior underneath the rectus  femoris to give exposure to the entire anterior capsule. A T-shaped   capsulotomy was performed. The edges were tagged and the femoral head  was identified.       Osteophytes are removed off the superior acetabulum.  The femoral neck was then cut in situ with an oscillating saw. Traction  was then applied to the left lower extremity utilizing the [facility]  traction. The femoral head was then removed. Retractors were placed  around the acetabulum and then circumferential removal of the labrum was  performed. Osteophytes were also removed. Reaming starts at 47 mm to  medialize and  Increased in 2 mm increments to 51 mm. We reamed in  approximately 40 degrees of abduction, 20 degrees anteversion. A 52 mm  pinnacle acetabular shell was then impacted in anatomic position under  fluoroscopic guidance with excellent purchase. We did not need to place  any additional dome screws. A 32 mm neutral + 4 marathon liner was then  placed into the acetabular shell.       The femoral lift was then placed along the lateral aspect of the femur  just distal to the vastus ridge. The leg was  externally rotated and capsule  was stripped off the inferior aspect of the femoral neck down to the  level of the lesser trochanter, this was done with electrocautery. The femur was lifted after this was performed. The  leg was then placed in an extended and adducted position essentially delivering the femur. We also removed the capsule superiorly and the piriformis from the piriformis fossa to  gain excellent exposure of the  proximal femur. Rongeur was used to remove some cancellous bone to get  into the lateral portion of the proximal femur for placement of the  initial starter reamer. The starter broaches was placed  the starter broach  and was shown to go down the center of the canal. Broaching  with the Actis system was then performed starting at size 0  coursing  Up to size 5. A size 5 had excellent torsional and rotational  and axial stability. The trial standard offset neck was then  placed  with a 32 + 1 trial head. The hip was then reduced. We confirmed that  the stem was in the canal both on AP and lateral x-rays. It also has excellent sizing. The hip was reduced with outstanding stability through full extension and full external rotation.. AP pelvis was taken and the leg lengths were measured and found to be equal. Hip was then dislocated again and the femoral head and neck removed. The  femoral broach was removed. Size 5 Actis stem with a standard offset  neck was then impacted into the femur following native anteversion. Has  excellent purchase in the canal. Excellent torsional and rotational and  axial stability. It is confirmed to be in the canal on AP and lateral  fluoroscopic views. The 32 + 1 metal head was placed and the hip  reduced with outstanding stability. Again AP pelvis was taken and it  confirmed that the leg lengths were equal. The wound was then copiously  irrigated with saline solution and the capsule reattached and repaired  with Ethibond suture. 30 ml of .25% Bupivicaine was  injected into the capsule and into the edge of the tensor fascia lata as well as subcutaneous tissue. The fascia overlying the tensor fascia lata was then closed with a running #1 V-Loc. Subcu was closed with interrupted 2-0 Vicryl and subcuticular running 4-0 Monocryl. Incision was cleaned  and dried. Steri-Strips and a bulky sterile dressing applied. The patient was awakened and transported to  recovery in stable condition.        Please note that a surgical assistant was a medical necessity for this procedure to perform it in a safe and expeditious manner. Assistant was necessary to provide appropriate retraction of vital neurovascular structures and to prevent femoral fracture and allow for anatomic placement of the prosthesis.  Gaynelle Arabian, M.D.

## 2022-07-05 NOTE — Anesthesia Postprocedure Evaluation (Signed)
Anesthesia Post Note  Patient: Elizabeth [name]  Procedure(s) Performed: TOTAL HIP ARTHROPLASTY ANTERIOR APPROACH (Right: Hip)     Patient location during evaluation: PACU Anesthesia Type: General Level of consciousness: sedated Pain management: pain level controlled Vital Signs Assessment: post-procedure vital signs reviewed and stable Respiratory status: spontaneous breathing and respiratory function stable Cardiovascular status: stable Postop Assessment: no apparent nausea or vomiting Anesthetic complications: no   No notable events documented.  Last Vitals:  Vitals:   07/05/22 1200 07/05/22 1215  BP: 137/78 137/77  Pulse: 73 71  Resp: 11 13  Temp:    SpO2: 94% 91%    Last Pain:  Vitals:   07/05/22 1215  TempSrc:   PainSc: Asleep        RLE Motor Response: Purposeful movement;Responds to commands (07/05/22 1215) RLE Sensation: Full sensation (07/05/22 1215)      [place]

## 2022-07-05 NOTE — Anesthesia Procedure Notes (Signed)
Procedure Name: Intubation Date/Time: 07/05/2022 10:00 AM  Performed by: Claudia Desanctis, CRNAPre-anesthesia Checklist: Patient identified, Emergency Drugs available, Suction available and Patient being monitored Patient Re-evaluated:Patient Re-evaluated prior to induction Oxygen Delivery Method: Circle system utilized Preoxygenation: Pre-oxygenation with 100% oxygen Induction Type: IV induction Ventilation: Mask ventilation without difficulty Laryngoscope Size: Miller and 3 Grade View: Grade I Tube type: Oral Tube size: 7.0 mm Number of attempts: 1 Airway Equipment and Method: Stylet Placement Confirmation: ETT inserted through vocal cords under direct vision, positive ETCO2 and breath sounds checked- equal and bilateral Secured at: 22 cm Tube secured with: Tape Dental Injury: Teeth and Oropharynx as per pre-operative assessment

## 2022-07-06 ENCOUNTER — Encounter (HOSPITAL_COMMUNITY): Payer: Self-pay | Admitting: Orthopedic Surgery

## 2022-07-06 DIAGNOSIS — Z79899 Other long term (current) drug therapy: Secondary | ICD-10-CM | POA: Diagnosis not present

## 2022-07-06 DIAGNOSIS — Z96642 Presence of left artificial hip joint: Secondary | ICD-10-CM | POA: Diagnosis not present

## 2022-07-06 DIAGNOSIS — Z853 Personal history of malignant neoplasm of breast: Secondary | ICD-10-CM | POA: Diagnosis not present

## 2022-07-06 DIAGNOSIS — Z87891 Personal history of nicotine dependence: Secondary | ICD-10-CM | POA: Diagnosis not present

## 2022-07-06 DIAGNOSIS — M1611 Unilateral primary osteoarthritis, right hip: Secondary | ICD-10-CM | POA: Diagnosis not present

## 2022-07-06 LAB — BASIC METABOLIC PANEL
Anion gap: 4 — ABNORMAL LOW (ref 5–15)
BUN: 12 mg/dL (ref 8–23)
CO2: 26 mmol/L (ref 22–32)
Calcium: 8.7 mg/dL — ABNORMAL LOW (ref 8.9–10.3)
Chloride: 105 mmol/L (ref 98–111)
Creatinine, Ser: 0.73 mg/dL (ref 0.44–1.00)
GFR, Estimated: >60 mL/min (ref >60)
Glucose, Bld: 138 mg/dL — ABNORMAL HIGH (ref 70–99)
Potassium: 3.6 mmol/L (ref 3.5–5.1)
Sodium: 135 mmol/L (ref 135–145)

## 2022-07-06 LAB — CBC
HCT: 29.1 % — ABNORMAL LOW (ref 36.0–46.0)
Hemoglobin: 9.6 g/dL — ABNORMAL LOW (ref 12.0–15.0)
MCH: 33.3 pg (ref 26.0–34.0)
MCHC: 33 g/dL (ref 30.0–36.0)
MCV: 101 fL — ABNORMAL HIGH (ref 80.0–100.0)
Platelets: 188 10*3/uL (ref 150–400)
RBC: 2.88 MIL/uL — ABNORMAL LOW (ref 3.87–5.11)
RDW: 11.7 % (ref 11.5–15.5)
WBC: 8.7 10*3/uL (ref 4.0–10.5)
nRBC: 0 % (ref 0.0–0.2)

## 2022-07-06 MED ORDER — RIVAROXABAN 10 MG PO TABS: 10.0000 mg | ORAL_TABLET | Freq: Every day | ORAL | 0 refills | Status: AC

## 2022-07-06 MED ORDER — HYDROCODONE-ACETAMINOPHEN 5-325 MG PO TABS: 1.0000 | ORAL_TABLET | Freq: Four times a day (QID) | ORAL | 0 refills | Status: DC | PRN

## 2022-07-06 MED ORDER — TRAMADOL HCL 50 MG PO TABS: 50.0000 mg | ORAL_TABLET | Freq: Four times a day (QID) | ORAL | 0 refills | Status: DC | PRN

## 2022-07-06 MED ORDER — METHOCARBAMOL 500 MG PO TABS: 500.0000 mg | ORAL_TABLET | Freq: Four times a day (QID) | ORAL | 0 refills | Status: DC | PRN

## 2022-07-06 NOTE — Progress Notes (Signed)
   Subjective: 1 Day Post-Op Procedure(s) (LRB): TOTAL HIP ARTHROPLASTY ANTERIOR APPROACH (Right) Patient seen in rounds by Dr. Wynelle Link. Patient is well, and has had no acute complaints or problems. Denies SOB or chest pain. Denies calf pain. Voiding without difficulty. Patient reports pain as mild.  Worked with physical therapy yesterday and ambulated 15'.  Objective: Vital signs in last 24 hours: Temp:  [97.5 F (36.4 C)-98.3 F (36.8 C)] 98 F (36.7 C) (08/17 0516) Pulse Rate:  [71-90] 90 (08/17 0516) Resp:  [11-17] 17 (08/17 0516) BP: (117-150)/(63-84) 135/68 (08/17 0516) SpO2:  [91 %-100 %] 96 % (08/17 0516) Weight:  [67.9 kg] 67.9 kg (08/16 0820)  Intake/Output from previous day:  Intake/Output Summary (Last 24 hours) at 07/06/2022 0740 Last data filed at 07/06/2022 0500 Gross per 24 hour  Intake 1546.92 ml  Output 1900 ml  Net -353.08 ml     Intake/Output this shift: No intake/output data recorded.  Labs: Recent Labs    07/06/22 0316  HGB 9.6*   Recent Labs    07/06/22 0316  WBC 8.7  RBC 2.88*  HCT 29.1*  PLT 188   Recent Labs    07/06/22 0316  NA 135  K 3.6  CL 105  CO2 26  BUN 12  CREATININE 0.73  GLUCOSE 138*  CALCIUM 8.7*   No results for input(s): "LABPT", "INR" in the last 72 hours.  Exam: General - Patient is Alert and Oriented Extremity - Neurologically intact Neurovascular intact Sensation intact distally Dorsiflexion/Plantar flexion intact Dressing - dressing C/D/I Motor Function - intact, moving foot and toes well on exam.  Past Medical History:  Diagnosis Date   Arthritis    Cancer ([place])    left breast cancer   Chicken pox    GERD (gastroesophageal reflux disease)    History of radiation therapy 06/26/17- 07/24/17   Left Breast 50.05 Gy total   Personal history of radiation therapy    Shingles 07/2002   Sleep apnea    mild uses oral devise   Urinary tract infection     Assessment/Plan: 1 Day Post-Op Procedure(s)  (LRB): TOTAL HIP ARTHROPLASTY ANTERIOR APPROACH (Right) Principal Problem:   Primary osteoarthritis of right hip  Estimated body mass index is 23.79 kg/m as calculated from the following:   Height as of this encounter: 5' 6.5" (1.689 m).   Weight as of this encounter: 67.9 kg. Advance diet Up with therapy D/C IV fluids  DVT Prophylaxis - Xarelto Weight bearing as tolerated. Continue physical therapy.  Plan is to go Skilled nursing facility after hospital stay. They currently live in independent living at [facility] but she is hoping to discharge to the rehab area of [place]. Appreciate social work's assistance in discharge coordination. Follow-up in clinic in 2 weeks.  Will print scripts in preparation for discharge. The PDMP database was reviewed today prior to any opioid medications being prescribed to this patient.  R. Jaynie Bream, PA-C Orthopedic Surgery [phone] 07/06/2022, 7:40 AM

## 2022-07-06 NOTE — Discharge Summary (Signed)
Physician Discharge Summary   Patient ID: Elizabeth [name] MRN: [id number] DOB/AGE: 01-05-1941 81 y.o.  Admit date: 07/05/2022 Discharge date:   Primary Diagnosis: Osteoarthritis of the right hip  Admission Diagnoses:  Past Medical History:  Diagnosis Date   Arthritis    Cancer ([place])    left breast cancer   Chicken pox    GERD (gastroesophageal reflux disease)    History of radiation therapy 06/26/17- 07/24/17   Left Breast 50.05 Gy total   Personal history of radiation therapy    Shingles 07/2002   Sleep apnea    mild uses oral devise   Urinary tract infection    Discharge Diagnoses:   Principal Problem:   Primary osteoarthritis of right hip  Estimated body mass index is 23.79 kg/m as calculated from the following:   Height as of this encounter: 5' 6.5" (1.689 m).   Weight as of this encounter: 67.9 kg.  Procedure:  Procedure(s) (LRB): TOTAL HIP ARTHROPLASTY ANTERIOR APPROACH (Right)   Consults: None  HPI: Elizabeth [name] is a 81 y.o. female who has advanced end-stage arthritis of their Right  hip with progressively worsening pain and dysfunction.The patient has failed nonoperative management and presents for total hip arthroplasty.   Laboratory Data: Admission on 07/05/2022  Component Date Value Ref Range Status   WBC 07/06/2022 8.7  4.0 - 10.5 K/uL Final   RBC 07/06/2022 2.88 (L)  3.87 - 5.11 MIL/uL Final   Hemoglobin 07/06/2022 9.6 (L)  12.0 - 15.0 g/dL Final   HCT 07/06/2022 29.1 (L)  36.0 - 46.0 % Final   MCV 07/06/2022 101.0 (H)  80.0 - 100.0 fL Final   MCH 07/06/2022 33.3  26.0 - 34.0 pg Final   MCHC 07/06/2022 33.0  30.0 - 36.0 g/dL Final   RDW 07/06/2022 11.7  11.5 - 15.5 % Final   Platelets 07/06/2022 188  150 - 400 K/uL Final   nRBC 07/06/2022 0.0  0.0 - 0.2 % Final   Performed at [facility], [place] [address], [place], [place] [address]   Sodium 07/06/2022 135  135 - 145 mmol/L Final   Potassium 07/06/2022 3.6  3.5 -  5.1 mmol/L Final   Chloride 07/06/2022 105  98 - 111 mmol/L Final   CO2 07/06/2022 26  22 - 32 mmol/L Final   Glucose, Bld 07/06/2022 138 (H)  70 - 99 mg/dL Final   Glucose reference range applies only to samples taken after fasting for at least 8 hours.   BUN 07/06/2022 12  8 - 23 mg/dL Final   Creatinine, Ser 07/06/2022 0.73  0.44 - 1.00 mg/dL Final   Calcium 07/06/2022 8.7 (L)  8.9 - 10.3 mg/dL Final   GFR, Estimated 07/06/2022 >60  >60 mL/min Final   Comment: (NOTE) Calculated using the CKD-EPI Creatinine Equation (2021)    Anion gap 07/06/2022 4 (L)  5 - 15 Final   Performed at [facility], [place] [address], [place], [place] [address]  Hospital Outpatient Visit on 06/22/2022  Component Date Value Ref Range Status   MRSA, PCR 06/22/2022 NEGATIVE  NEGATIVE Final   Staphylococcus aureus 06/22/2022 NEGATIVE  NEGATIVE Final   Comment: (NOTE) The Xpert SA Assay (FDA approved for NASAL specimens in patients 66 years of age and older), is one component of a comprehensive surveillance program. It is not intended to diagnose infection nor to guide or monitor treatment. Performed at [facility], [address] [place], [place]  [address]    WBC 06/22/2022 5.3  4.0 - 10.5 K/uL Final   RBC 06/22/2022 3.39 (L)  3.87 - 5.11 MIL/uL Final   Hemoglobin 06/22/2022 11.3 (L)  12.0 - 15.0 g/dL Final   HCT 06/22/2022 33.5 (L)  36.0 - 46.0 % Final   MCV 06/22/2022 98.8  80.0 - 100.0 fL Final   MCH 06/22/2022 33.3  26.0 - 34.0 pg Final   MCHC 06/22/2022 33.7  30.0 - 36.0 g/dL Final   RDW 06/22/2022 11.9  11.5 - 15.5 % Final   Platelets 06/22/2022 275  150 - 400 K/uL Final   nRBC 06/22/2022 0.0  0.0 - 0.2 % Final   Performed at [facility], [place] [address] [place], [place] [address]   ABO/RH(D) 06/22/2022 O POS   Final   Antibody Screen 06/22/2022 NEG   Final   Sample Expiration 06/22/2022 07/06/2022,2359   Final   Extend sample reason  06/22/2022    Final                   Value:NO TRANSFUSIONS OR PREGNANCY IN THE PAST 3 MONTHS Performed at [facility], [place] [address] [place], [place] [address]      X-Rays:DG Pelvis Portable  Result Date: 07/05/2022 CLINICAL DATA:  Postop right hip EXAM: PORTABLE PELVIS 1 VIEWS COMPARISON:  None Available. FINDINGS: Interval postsurgical changes from right total hip arthroplasty. Arthroplasty components appear in their expected alignment. Prior left total hip arthroplasty and fusion of the lumbosacral spine. No periprosthetic fracture is identified. Expected postoperative changes within the overlying soft tissues. IMPRESSION: Postsurgical changes from right hip arthroplasty. Electronically Signed   By: Yetta Glassman M.D.   On: 07/05/2022 12:46   DG HIP UNILAT WITH PELVIS 1V RIGHT  Result Date: 07/05/2022 CLINICAL DATA:  Right hip replaced EXAM: DG HIP (WITH OR WITHOUT PELVIS) 1V RIGHT COMPARISON:  Radiograph 12/21/2021 FINDINGS: Intraoperative images during right hip arthroplasty. Normal alignment without evidence of loosening or periprosthetic fracture. IMPRESSION: Intraoperative images during right hip arthroplasty. Normal alignment without evidence of immediate complication. Electronically Signed   By: Maurine Simmering M.D.   On: 07/05/2022 10:55   DG C-Arm 1-60 Min-No Report  Result Date: 07/05/2022 Fluoroscopy was utilized by the requesting physician.  No radiographic interpretation.    EKG: Orders placed or performed in visit on 03/06/22   EKG 12-Lead     Hospital Course: Elizabeth [name] is a 81 y.o. who was admitted to [facility]. They were brought to the operating room on 07/05/2022 and underwent Procedure(s): [place].  Patient tolerated the procedure well and was later transferred to the recovery room and then to the orthopaedic floor for postoperative care. They were given PO and IV analgesics for pain control  following their surgery. They were given 24 hours of postoperative antibiotics of  Anti-infectives (From admission, onward)    Start     Dose/Rate Route Frequency Ordered Stop   07/05/22 1600  ceFAZolin (ANCEF) IVPB 2g/100 mL premix        2 g 200 mL/hr over 30 Minutes Intravenous Every 6 hours 07/05/22 1249 07/05/22 2213   07/05/22 0815  ceFAZolin (ANCEF) IVPB 2g/100 mL premix        2 g 200 mL/hr over 30 Minutes Intravenous On call to O.R. 07/05/22 2778 07/05/22 1000     and started on DVT prophylaxis in the form of Xarelto.   PT and OT were ordered  for total joint protocol. Discharge planning consulted to help with postop disposition and equipment needs.  Patient had a fair night on the evening of surgery. They started to get up OOB with therapy on POD #0. Pt was seen during rounds and was ready to go home pending progress with therapy. She worked with therapy on POD #1 and was meeting her goals but would benefit from continued care at [facility]. Pt was discharged to [facility] later that day in stable condition.  Diet: Regular diet Activity: WBAT Follow-up: in 2 weeks Disposition: Skilled nursing facility Discharged Condition: stable   Discharge Instructions     Call MD / Call 911   Complete by: As directed    If you experience chest pain or shortness of breath, CALL 911 and be transported to the hospital emergency room.  If you develope a fever above 101 F, pus (white drainage) or increased drainage or redness at the wound, or calf pain, call your surgeon's office.   Change dressing   Complete by: As directed    You have an adhesive waterproof bandage over the incision. Leave this in place until your first follow-up appointment. Once you remove this you will not need to place another bandage.   Constipation Prevention   Complete by: As directed    Drink plenty of fluids.  Prune juice may be helpful.  You may use a stool softener, such as Colace (over the counter) 100 mg twice a day.   Use MiraLax (over the counter) for constipation as needed.   Diet - low sodium heart healthy   Complete by: As directed    Do not sit on low chairs, stoools or toilet seats, as it may be difficult to get up from low surfaces   Complete by: As directed    Driving restrictions   Complete by: As directed    No driving for two weeks   Post-operative opioid taper instructions:   Complete by: As directed    POST-OPERATIVE OPIOID TAPER INSTRUCTIONS: It is important to wean off of your opioid medication as soon as possible. If you do not need pain medication after your surgery it is ok to stop day one. Opioids include: Codeine, Hydrocodone(Norco, Vicodin), Oxycodone(Percocet, oxycontin) and hydromorphone amongst others.  Long term and even short term use of opiods can cause: Increased pain response Dependence Constipation Depression Respiratory depression And more.  Withdrawal symptoms can include Flu like symptoms Nausea, vomiting And more Techniques to manage these symptoms Hydrate well Eat regular healthy meals Stay active Use relaxation techniques(deep breathing, meditating, yoga) Do Not substitute Alcohol to help with tapering If you have been on opioids for less than two weeks and do not have pain than it is ok to stop all together.  Plan to wean off of opioids This plan should start within one week post op of your joint replacement. Maintain the same interval or time between taking each dose and first decrease the dose.  Cut the total daily intake of opioids by one tablet each day Next start to increase the time between doses. The last dose that should be eliminated is the evening dose.      TED hose   Complete by: As directed    Use stockings (TED hose) for three weeks on both leg(s).  You may remove them at night for sleeping.   Weight bearing as tolerated   Complete by: As directed       Allergies as of 07/06/2022   No  Known Allergies      Medication List      STOP taking these medications    ibuprofen 200 MG tablet Commonly known as: ADVIL       TAKE these medications    acetaminophen 500 MG tablet Commonly known as: TYLENOL Take 1,000 mg by mouth in the morning, at noon, and at bedtime.   Advanced Probiotic 10 Caps Take 1 capsule by mouth daily.   Biotin 5000 MCG Tabs Take 5,000 mcg by mouth daily.   CENTRUM SILVER PO Take 1 tablet by mouth daily.   CRANBERRY PO Take 2 capsules by mouth in the morning, at noon, and at bedtime.   docusate sodium 100 MG capsule Commonly known as: COLACE Take 100 mg by mouth 2 (two) times daily.   estradiol 0.1 MG/GM vaginal cream Commonly known as: ESTRACE Place vaginally 3 (three) times a week.   gabapentin 300 MG capsule Commonly known as: NEURONTIN TAKE 1 CAPSULE BY MOUTH FOUR TIMES A DAY   guaiFENesin 600 MG 12 hr tablet Commonly known as: MUCINEX Take 600 mg by mouth daily.   HYDROcodone-acetaminophen 5-325 MG tablet Commonly known as: NORCO/VICODIN Take 1-2 tablets by mouth every 6 (six) hours as needed for severe pain.   loratadine 10 MG tablet Commonly known as: CLARITIN Take 10 mg by mouth daily.   METAMUCIL FIBER PO Take 3-4 capsules by mouth See admin instructions. 3 caps in the morning, 4 caps in  the evening   methocarbamol 500 MG tablet Commonly known as: ROBAXIN Take 1 tablet (500 mg total) by mouth every 6 (six) hours as needed for muscle spasms.   montelukast 10 MG tablet Commonly known as: SINGULAIR TAKE 1 TABLET BY MOUTH EVERYDAY AT BEDTIME   omeprazole 20 MG capsule Commonly known as: PRILOSEC Take 1 capsule (20 mg total) by mouth daily. Office visit for further refills   rivaroxaban 10 MG Tabs tablet Commonly known as: XARELTO Take 1 tablet (10 mg total) by mouth daily with breakfast for 20 days. Then take one 81 mg aspirin once a day for 3 weeks. Then discontinue.   traMADol 50 MG tablet Commonly known as: ULTRAM Take 1-2 tablets (50-100 mg  total) by mouth every 6 (six) hours as needed for moderate pain (not controlled by Vicodin).   VITAMIN D PO Take 1,000 Units by mouth daily.               Discharge Care Instructions  (From admission, onward)           Start     Ordered   07/06/22 0000  Weight bearing as tolerated        07/06/22 0747   07/06/22 0000  Change dressing       Comments: You have an adhesive waterproof bandage over the incision. Leave this in place until your first follow-up appointment. Once you remove this you will not need to place another bandage.   07/06/22 0747            Contact information for follow-up providers     Aluisio, Pilar Plate, MD. Schedule an appointment as soon as possible for a visit in 2 week(s).   Specialty: Orthopedic Surgery Contact information: [address] [place] [address] [phone]              Contact information for after-discharge care     Destination     [facility] .   Service: Skilled Nursing Contact information: [address] [place] [phone]                     Signed: R. Jaynie Bream, PA-C Orthopedic Surgery 07/06/2022, 11:25 AM

## 2022-07-06 NOTE — NC FL2 (Signed)
[place] LEVEL OF CARE SCREENING TOOL     IDENTIFICATION  Patient Name: Elizabeth [name] Birthdate: [birth year] Sex: female Admission Date (Current Location): 07/05/2022  [facility] [place] and [place] Number:  Herbalist and Address:  [facility],  [place], [place]      Provider Number: [id number]  Attending Physician Name and Address:  Gaynelle Arabian, MD  Relative Name and Phone Number:  Sarha Bartelt [phone]    Current Level of Care: Hospital Recommended Level of Care: [place] Prior Approval Number:    Date Approved/Denied:   PASRR Number: [id number]  Discharge Plan: SNF    Current Diagnoses: Patient Active Problem List   Diagnosis Date Noted   Primary osteoarthritis of right hip 07/05/2022   Blood loss anemia 01/06/2022   OA (osteoarthritis) of hip 12/21/2021   S/P total left hip arthroplasty 12/21/2021   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   Hx of breast cancer 08/12/2021   Pain of left hip joint 01/24/2021   Dysphagia 11/02/2020   Diarrhea 11/02/2020   Sensorineural hearing loss (SNHL) of both ears 06/24/2019   S/P lumbar spinal fusion 12/01/2016   Lumbar back pain 12/23/2015   Routine general medical examination at a health care facility 10/08/2013   Chronic back pain 12/26/2012   Blood glucose elevated 12/26/2012    Orientation RESPIRATION BLADDER Height & Weight     Self, Time, Situation, Place  Normal Continent Weight: 149 lb 9.8 oz (67.9 kg) Height:  5' 6.5" (168.9 cm)  BEHAVIORAL SYMPTOMS/MOOD NEUROLOGICAL BOWEL NUTRITION STATUS      Continent Diet (Regular)  AMBULATORY STATUS COMMUNICATION OF NEEDS Skin   Limited Assist Verbally Other (Comment) (surgical incision only)                       Personal Care Assistance Level of Assistance  Bathing, Dressing Bathing Assistance: Limited assistance   Dressing Assistance: Limited assistance      Functional Limitations Info             [place]  PT (By licensed PT), OT (By licensed OT)     PT Frequency: 5x/wk OT Frequency: 5x/wk            Contractures Contractures Info: Not present    Additional Factors Info  Code Status, Allergies Code Status Info: Full Allergies Info: NKDA           Current Medications (07/06/2022):  This is the current hospital active medication list Current Facility-Administered Medications  Medication Dose Route Frequency Provider Last Rate Last Admin   0.9 %  sodium chloride infusion   Intravenous Continuous Edmisten, Kristie L, PA 75 mL/hr at 07/06/22 0358 New Bag at 07/06/22 0358   acetaminophen (TYLENOL) tablet 325-650 mg  325-650 mg Oral Q6H PRN Edmisten, Kristie L, PA       bisacodyl (DULCOLAX) suppository 10 mg  10 mg Rectal Daily PRN Edmisten, Kristie L, PA       docusate sodium (COLACE) capsule 100 mg  100 mg Oral BID Edmisten, Kristie L, PA   100 mg at 07/05/22 2140   gabapentin (NEURONTIN) capsule 300 mg  300 mg Oral QID Edmisten, Kristie L, PA   300 mg at 07/05/22 2140   HYDROcodone-acetaminophen ([place]) 7.5-325 MG per tablet 1-2 tablet  1-2 tablet Oral Q4H PRN Edmisten, Kristie L, PA   1 tablet at 07/06/22 0813   HYDROcodone-acetaminophen (NORCO/VICODIN)  5-325 MG per tablet 1-2 tablet  1-2 tablet Oral Q4H PRN Edmisten, Kristie L, PA       magnesium citrate solution 1 Bottle  1 Bottle Oral Once PRN Edmisten, Kristie L, PA       menthol-cetylpyridinium (CEPACOL) lozenge 3 mg  1 lozenge Oral PRN Edmisten, Kristie L, PA       Or   phenol (CHLORASEPTIC) mouth spray 1 spray  1 spray Mouth/Throat PRN Edmisten, Kristie L, PA       methocarbamol (ROBAXIN) tablet 500 mg  500 mg Oral Q6H PRN Edmisten, Kristie L, PA       Or   methocarbamol (ROBAXIN) 500 mg in dextrose 5 % 50 mL IVPB  500 mg Intravenous Q6H PRN Edmisten, Kristie L, PA       metoCLOPramide (REGLAN) tablet 5-10 mg  5-10 mg Oral Q8H PRN Edmisten,  Kristie L, PA       Or   metoCLOPramide (REGLAN) injection 5-10 mg  5-10 mg Intravenous Q8H PRN Edmisten, Kristie L, PA       montelukast (SINGULAIR) tablet 10 mg  10 mg Oral QHS Edmisten, Kristie L, PA       morphine (PF) 2 MG/ML injection 0.5-1 mg  0.5-1 mg Intravenous Q2H PRN Edmisten, Kristie L, PA       ondansetron (ZOFRAN) tablet 4 mg  4 mg Oral Q6H PRN Edmisten, Kristie L, PA       Or   ondansetron (ZOFRAN) injection 4 mg  4 mg Intravenous Q6H PRN Edmisten, Kristie L, PA       pantoprazole (PROTONIX) EC tablet 40 mg  40 mg Oral Daily Edmisten, Kristie L, PA       polyethylene glycol (MIRALAX / GLYCOLAX) packet 17 g  17 g Oral Daily PRN Edmisten, Kristie L, PA       rivaroxaban (XARELTO) tablet 10 mg  10 mg Oral Q breakfast Edmisten, Kristie L, PA       traMADol (ULTRAM) tablet 50-100 mg  50-100 mg Oral Q6H PRN Edmisten, Kristie L, PA         Discharge Medications: Please see discharge summary for a list of discharge medications.  Relevant Imaging Results:  Relevant Lab Results:   Additional Information SS# [id number]  Lennart Pall, LCSW

## 2022-07-06 NOTE — TOC Transition Note (Signed)
Transition of Care [facility]) - CM/SW Discharge Note   Patient Details  Name: Elizabeth [name] MRN: [id number] Date of Birth: May 21, 1941  [facility] CM/SW Contact:  Lennart Pall, LCSW Phone Number: 07/06/2022, 12:40 PM   Clinical Narrative:    Pt medically ready for dc today to SNF bed at [facility].  Pt and facility aware and agreeable.  Pt's spouse to provide dc transportation.  RN to call report to [phone].  No further TOC needs.   Final next level of care: Skilled Nursing Facility Barriers to Discharge: No Barriers Identified   Patient Goals and CMS Choice Patient states their goals for this hospitalization and ongoing recovery are:: return to SNF then to her ILF apt      Discharge Placement   Existing PASRR number confirmed : 07/06/22          Patient chooses bed at: [facility] Patient to be transferred to facility by: private vehicle Name of family member notified: spouse    Discharge Plan and Services                DME Arranged: N/A DME Agency: NA                  Social Determinants of Health (SDOH) Interventions     Readmission Risk Interventions     No data to display

## 2022-07-06 NOTE — Plan of Care (Signed)

## 2022-07-06 NOTE — Progress Notes (Signed)
Physical Therapy Treatment Patient Details Name: Elizabeth [name] MRN: [id number] DOB: Apr 08, 1941 Today's Date: 07/06/2022   History of Present Illness Pt is a 81yo female presents s/p R-THA, AA on 07/05/22 PMH: hx of breast cancer s/p radiation, GERD, hx of shingles, spinal fusion T-10 to pelvis, L-THA 12/21/2021.    PT Comments    Pt seen POD1, no complaints, received OOB in recliner; reporting some nausea this morning but that had passed. Demonstrated supervision for transfers and for ambulation in hallway with RW 138f. Provided HEP pt completed exercises with minimal cuing. Educated pt on progressive walking program, pt verbalized understanding. All education completed and pt had no further questions. Pt has met mobility goals for safe discharge home, PT is signing off, should needs change please reconsult. Thank you for this referral.     Recommendations for follow up therapy are one component of a multi-disciplinary discharge planning process, led by the attending physician.  Recommendations may be updated based on patient status, additional functional criteria and insurance authorization.  Follow Up Recommendations  Follow physician's recommendations for discharge plan and follow up therapies     Assistance Recommended at Discharge Intermittent Supervision/Assistance  Patient can return home with the following A little help with walking and/or transfers;A little help with bathing/dressing/bathroom;Assistance with cooking/housework;Assist for transportation;Help with stairs or ramp for entrance   Equipment Recommendations  Rolling walker (2 wheels) (Pt has rollator but would benefit from 2WRW)    Recommendations for Other Services       Precautions / Restrictions Precautions Precautions: Fall Restrictions Weight Bearing Restrictions: No Other Position/Activity Restrictions: wbat     Mobility  Bed Mobility               General bed mobility comments: Pt OOB in  recliner at entry and exit    Transfers Overall transfer level: Needs assistance Equipment used: Rolling walker (2 wheels) Transfers: Sit to/from Stand Sit to Stand: Supervision           General transfer comment: For safety, no physical assist required    Ambulation/Gait Ambulation/Gait assistance: Min guard, Supervision Gait Distance (Feet): 120 Feet Assistive device: Rolling walker (2 wheels) Gait Pattern/deviations: Step-to pattern Gait velocity: decreased     General Gait Details: Pt ambulated with RW and min guard progressed to supervision 1267fwith no overt LOB or physical assist required.   Stairs             Wheelchair Mobility    Modified Rankin (Stroke Patients Only)       Balance Overall balance assessment: Needs assistance Sitting-balance support: Feet supported, No upper extremity supported Sitting balance-Leahy Scale: Good     Standing balance support: Reliant on assistive device for balance, During functional activity, Bilateral upper extremity supported Standing balance-Leahy Scale: Poor                              Cognition Arousal/Alertness: Awake/alert Behavior During Therapy: WFL for tasks assessed/performed Overall Cognitive Status: Within Functional Limits for tasks assessed                                          Exercises Total Joint Exercises Ankle Circles/Pumps: AROM, Both, 10 reps Quad Sets: AROM, Right, 10 reps, Seated Short Arc Quad: AROM, Right, 10 reps, Seated Heel Slides: AROM, Right, 10 reps Hip  ABduction/ADduction: AROM, Right, 10 reps    General Comments        Pertinent Vitals/Pain      Home Living                          Prior Function            PT Goals (current goals can now be found in the care plan section) Acute Rehab PT Goals Patient Stated Goal: To continue pilates and work on chronic pain. PT Goal Formulation: With patient/family Time For Goal  Achievement: 07/12/22 Potential to Achieve Goals: Good Progress towards PT goals: Progressing toward goals    Frequency    7X/week      PT Plan Current plan remains appropriate    Co-evaluation              AM-PAC PT "6 Clicks" Mobility   Outcome Measure  Help needed turning from your back to your side while in a flat bed without using bedrails?: None Help needed moving from lying on your back to sitting on the side of a flat bed without using bedrails?: A Little Help needed moving to and from a bed to a chair (including a wheelchair)?: A Little Help needed standing up from a chair using your arms (e.g., wheelchair or bedside chair)?: A Little Help needed to walk in hospital room?: A Little Help needed climbing 3-5 steps with a railing? : A Little 6 Click Score: 19    End of Session Equipment Utilized During Treatment: Gait belt Activity Tolerance: Patient tolerated treatment well;No increased pain Patient left: in chair;with call bell/phone within reach;with chair alarm set;with family/visitor present;with SCD's reapplied Nurse Communication: Mobility status PT Visit Diagnosis: Pain;Difficulty in walking, not elsewhere classified (R26.2) Pain - Right/Left: Right Pain - part of body: Hip     Time: 4560-2782 PT Time Calculation (min) (ACUTE ONLY): 43 min  Charges:  $Gait Training: 8-22 mins $Therapeutic Exercise: 8-22 mins $Self Care/Home Management: 8-22                    Coolidge Breeze, PT, DPT [facility] Office: [phone] Pager: [phone]   Coolidge Breeze 07/06/2022, 10:34 AM

## 2022-07-07 ENCOUNTER — Non-Acute Institutional Stay (SKILLED_NURSING_FACILITY): Payer: Medicare HMO | Admitting: Adult Health

## 2022-07-07 ENCOUNTER — Encounter: Payer: Self-pay | Admitting: Adult Health

## 2022-07-07 DIAGNOSIS — M62551 Muscle wasting and atrophy, not elsewhere classified, right thigh: Secondary | ICD-10-CM | POA: Diagnosis not present

## 2022-07-07 DIAGNOSIS — R739 Hyperglycemia, unspecified: Secondary | ICD-10-CM

## 2022-07-07 DIAGNOSIS — M25551 Pain in right hip: Secondary | ICD-10-CM | POA: Diagnosis not present

## 2022-07-07 DIAGNOSIS — R2689 Other abnormalities of gait and mobility: Secondary | ICD-10-CM | POA: Diagnosis not present

## 2022-07-07 DIAGNOSIS — K5901 Slow transit constipation: Secondary | ICD-10-CM

## 2022-07-07 DIAGNOSIS — M1611 Unilateral primary osteoarthritis, right hip: Secondary | ICD-10-CM | POA: Diagnosis not present

## 2022-07-07 DIAGNOSIS — R278 Other lack of coordination: Secondary | ICD-10-CM | POA: Diagnosis not present

## 2022-07-07 DIAGNOSIS — D500 Iron deficiency anemia secondary to blood loss (chronic): Secondary | ICD-10-CM

## 2022-07-07 DIAGNOSIS — Z471 Aftercare following joint replacement surgery: Secondary | ICD-10-CM | POA: Diagnosis not present

## 2022-07-07 DIAGNOSIS — Z96641 Presence of right artificial hip joint: Secondary | ICD-10-CM | POA: Diagnosis not present

## 2022-07-07 DIAGNOSIS — M5459 Other low back pain: Secondary | ICD-10-CM | POA: Diagnosis not present

## 2022-07-07 NOTE — Progress Notes (Unsigned)
Location:    [facility] Room Number: 157/A Place of Service:  SNF [phone]) Provider:  Royal Hawthorn, NP  Midge Minium, MD  Patient Care Team: Midge Minium, MD as PCP - General (Family Medicine) Paula Compton, MD as Consulting Physician (Obstetrics and Gynecology) Gloris Manchester, MD (Neurosurgery) Paulla Dolly Tamala Fothergill, DPM as Consulting Physician (Podiatry) Marica Otter, Edie (Optometry) Jovita Kussmaul, MD as Consulting Physician (General Surgery) Nicholas Lose, MD as Consulting Physician (Hematology and Oncology) Eppie Gibson, MD as Attending Physician (Radiation Oncology) Gardenia Phlegm, NP as Nurse Practitioner (Hematology and Oncology) Irene Shipper, MD as Consulting Physician (Gastroenterology)  Extended Emergency Contact Information Primary Emergency Contact: [facility] M Address: [address] [place], [place] [address] [place] Phone: [phone] Mobile Phone: [phone] Relation: Spouse Secondary Emergency Contact: Larose Kells,  [place] Phone: [phone] Mobile Phone: [phone] Relation: Daughter  Code Status:  Full Code Goals of care: Advanced Directive information    07/07/2022   12:09 PM  Advanced Directives  Does Patient Have a Medical Advance Directive? No;Yes  Does patient want to make changes to medical advance directive? No - Patient declined     Chief Complaint  Patient presents with   [facility] follow up     HPI:  Pt is a 81 y.o. female seen today for an acute visit for hospital follow up. PMH significant for severe OA, breast ca, mild sleep apnea, GERD. She is admitted to [facility]hab s/p right hip replacement with anterior approach due to severe OA on 07/05/22.  Surgery was delayed at one point due to infected sebaceous cyst on the right hip area. She previously had her left hip  replaced earlier this year. She reports she is using norco and robaxin for pain control. Has not had a BM yet but is going take metamucil today. Denies any nausea or vomiting. Will start working with therapy this afternoon WBAT.  Glucose was 138 8/16 Hgb 9.6 post op down from baseline 11.3   Past Medical History:  Diagnosis Date   Arthritis    Cancer ([place])    left breast cancer   Chicken pox    GERD (gastroesophageal reflux disease)    History of radiation therapy 06/26/17- 07/24/17   Left Breast 50.05 Gy total   Personal history of radiation therapy    Shingles 07/2002   Sleep apnea    mild uses oral devise   Urinary tract infection    Past Surgical History:  Procedure Laterality Date   BREAST BIOPSY Bilateral 03/07/2017   benign   BREAST BIOPSY Right 03/26/2017   benign   BREAST BIOPSY Left 03/26/2017   malignant   BREAST LUMPECTOMY Left 05/03/2017   BREAST LUMPECTOMY WITH RADIOACTIVE SEED LOCALIZATION Left 05/03/2017   Procedure: LEFT BREAST LUMPECTOMY WITH RADIOACTIVE SEED LOCALIZATION;  Surgeon: Jovita Kussmaul, MD;  Location: [facility];  Service: General;  Laterality: Left;   COLONOSCOPY     CYST EXCISION Right 03/10/2022   Procedure: EXCISION SEBACEOUS CYST RIGHT HIP;  Surgeon: Jovita Kussmaul, MD;  Location: [facility];  Service: General;  Laterality: Right;   HYSTEROSCOPY     Dr Ubaldo Glassing   SPINAL FUSION  12/01/2016   T10 to Pelvis   TOTAL HIP ARTHROPLASTY Left 12/21/2021   Procedure: TOTAL HIP ARTHROPLASTY ANTERIOR  APPROACH;  Surgeon: Gaynelle Arabian, MD;  Location: [facility];  Service: Orthopedics;  Laterality: Left;   TOTAL HIP ARTHROPLASTY Right 07/05/2022   Procedure: TOTAL HIP ARTHROPLASTY ANTERIOR APPROACH;  Surgeon: Gaynelle Arabian, MD;  Location: [facility];  Service: Orthopedics;  Laterality: Right;   UPPER GASTROINTESTINAL ENDOSCOPY  11/30/2020   WISDOM TOOTH EXTRACTION     age 81's    No Known Allergies  Allergies as of 07/07/2022   No Known Allergies       Medication List        Accurate as of July 07, 2022 12:11 PM. If you have any questions, ask your nurse or doctor.          acetaminophen 500 MG tablet Commonly known as: TYLENOL Take 1,000 mg by mouth in the morning, at noon, and at bedtime.   Advanced Probiotic 10 Caps Take 1 capsule by mouth daily.   aspirin EC 81 MG tablet Take 81 mg by mouth daily. Swallow whole. Start taking on: July 27, 2022   Biotin 5000 MCG Tabs Take 5,000 mcg by mouth daily.   CENTRUM SILVER PO Take 1 tablet by mouth daily.   CRANBERRY PO Take 2 capsules by mouth in the morning, at noon, and at bedtime.   docusate sodium 100 MG capsule Commonly known as: COLACE Take 100 mg by mouth 2 (two) times daily.   estradiol 0.1 MG/GM vaginal cream Commonly known as: ESTRACE Place vaginally 3 (three) times a week.   gabapentin 300 MG capsule Commonly known as: NEURONTIN TAKE 1 CAPSULE BY MOUTH FOUR TIMES A DAY   guaiFENesin 600 MG 12 hr tablet Commonly known as: MUCINEX Take 600 mg by mouth daily.   HYDROcodone-acetaminophen 5-325 MG tablet Commonly known as: NORCO/VICODIN Take 1-2 tablets by mouth every 6 (six) hours as needed for severe pain.   loratadine 10 MG tablet Commonly known as: CLARITIN Take 10 mg by mouth daily.   METAMUCIL FIBER PO Take 3-4 capsules by mouth See admin instructions. 3 caps in the morning, 4 caps in  the evening   methocarbamol 500 MG tablet Commonly known as: ROBAXIN Take 1 tablet (500 mg total) by mouth every 6 (six) hours as needed for muscle spasms.   montelukast 10 MG tablet Commonly known as: SINGULAIR TAKE 1 TABLET BY MOUTH EVERYDAY AT BEDTIME   omeprazole 20 MG capsule Commonly known as: PRILOSEC Take 1 capsule (20 mg total) by mouth daily. Office visit for further refills   rivaroxaban 10 MG Tabs tablet Commonly known as: XARELTO Take 1 tablet (10 mg total) by mouth daily with breakfast for 20 days. Then take one 81 mg aspirin once a  day for 3 weeks. Then discontinue.   traMADol 50 MG tablet Commonly known as: ULTRAM Take 1-2 tablets (50-100 mg total) by mouth every 6 (six) hours as needed for moderate pain (not controlled by Vicodin).   VITAMIN D PO Take 1,000 Units by mouth daily.        Review of Systems  Constitutional:  Positive for activity change. Negative for appetite change, chills, diaphoresis, fatigue, fever and unexpected weight change.  HENT:  Negative for congestion.   Respiratory:  Negative for cough, shortness of breath and wheezing.   Cardiovascular:  Negative for chest pain, palpitations and leg swelling.  Gastrointestinal:  Negative for abdominal distention, abdominal pain, constipation and diarrhea.  Genitourinary:  Negative for difficulty urinating and dysuria.  Musculoskeletal:  Positive for arthralgias (right hip pain) and gait problem. Negative for  back pain, joint swelling and myalgias.  Skin:  Positive for wound.  Neurological:  Negative for dizziness, tremors, seizures, syncope, facial asymmetry, speech difficulty, weakness, light-headedness, numbness and headaches.  Psychiatric/Behavioral:  Negative for agitation, behavioral problems and confusion.     Immunization History  Administered Date(s) Administered   Fluad Quad(high Dose 65+) 08/06/2019, 08/09/2020, 08/12/2021   Influenza, High Dose Seasonal PF 09/12/2013, 09/24/2015, 08/24/2016, 08/08/2018   Influenza,inj,Quad PF,6+ Mos 08/24/2014, 07/30/2017   Influenza-Unspecified 09/12/2013   PFIZER Comirnaty(Gray Top)Covid-19 Tri-Sucrose Vaccine 04/11/2021   PFIZER(Purple Top)SARS-COV-2 Vaccination 12/04/2019, 12/25/2019   Pfizer Covid-19 Vaccine Bivalent Booster 61yr & up 11/03/2021   Pneumococcal Conjugate-13 07/22/2008, 12/17/2014   Pneumococcal Polysaccharide-23 10/06/2016   Tetanus 10/08/2013   Zoster Recombinat (Shingrix) 11/07/2018, 05/21/2019   Zoster, Live 07/22/2008   Pertinent  Health Maintenance Due  Topic Date Due    INFLUENZA VACCINE  06/20/2022   DEXA SCAN  Completed      03/10/2022   11:54 AM 07/05/2022   12:00 PM 07/05/2022    8:19 PM 07/05/2022    8:20 PM 07/06/2022    7:45 AM  Fall Risk  Patient Fall Risk Level Low fall risk High fall risk Low fall risk Low fall risk Low fall risk   Functional Status Survey:    Vitals:   07/07/22 1129  BP: (!) 146/77  Pulse: 95  Resp: 14  Temp: 98.6 F (37 C)  SpO2: 93%  Weight: 147 lb (66.7 kg)   Body mass index is 23.37 kg/m. Physical Exam Vitals and nursing note reviewed.  Constitutional:      General: She is not in acute distress.    Appearance: She is not diaphoretic.  HENT:     Head: Normocephalic and atraumatic.  Neck:     Vascular: No JVD.  Cardiovascular:     Rate and Rhythm: Normal rate and regular rhythm.     Heart sounds: No murmur heard. Pulmonary:     Effort: Pulmonary effort is normal. No respiratory distress.     Breath sounds: Normal breath sounds. No wheezing.  Abdominal:     General: Bowel sounds are normal. There is no distension.     Palpations: Abdomen is soft.     Tenderness: There is no abdominal tenderness.  Musculoskeletal:     Right lower leg: No edema.     Left lower leg: No edema.  Skin:    General: Skin is warm and dry.     Comments: Right hip dressing with dark dressing in the center of the dressing. No saturated. No redness or swelling.  Neurological:     Mental Status: She is alert and oriented to person, place, and time.  Psychiatric:        Mood and Affect: Mood normal.     Labs reviewed: Recent Labs    03/06/22 1129 03/22/22 1313 07/06/22 0316  NA 130* 132* 135  K 4.8 4.2 3.6  CL 97 98 105  CO2 '26 27 26  '$ GLUCOSE 90 138* 138*  BUN '10 10 12  '$ CREATININE 0.68 0.74 0.73  CALCIUM 9.7 9.6 8.7*   Recent Labs    08/12/21 1008 12/08/21 1458 03/06/22 1129  AST '19 24 19  '$ ALT '21 22 15  '$ ALKPHOS 56 56 74  BILITOT 0.7 0.6 0.5  PROT 7.6 7.1 7.4  ALBUMIN 4.9 4.2 4.5   Recent Labs     08/12/21 1008 12/08/21 1458 01/06/22 1142 03/06/22 1129 06/22/22 1418 07/06/22 0316  WBC 9.8   < >  5.7 4.1 5.3 8.7  NEUTROABS 8.7*  --  4.6 2.7  --   --   HGB 12.6   < > 9.0* 12.4 11.3* 9.6*  HCT 37.4   < > 26.5* 36.2 33.5* 29.1*  MCV 98.3   < > 95.2 93.9 98.8 101.0*  PLT 326.0   < > 534.0 Repeated and verified X2.* 293.0 275 188   < > = values in this interval not displayed.   Lab Results  Component Value Date   TSH 2.62 03/06/2022   Lab Results  Component Value Date   HGBA1C 4.1 10/09/2013   Lab Results  Component Value Date   CHOL 220 (H) 08/12/2021   HDL 106.80 08/12/2021   LDLCALC 90 08/12/2021   TRIG 116.0 08/12/2021   CHOLHDL 2 08/12/2021    Significant Diagnostic Results in last 30 days:  DG Pelvis Portable  Result Date: 07/05/2022 CLINICAL DATA:  Postop right hip EXAM: PORTABLE PELVIS 1 VIEWS COMPARISON:  None Available. FINDINGS: Interval postsurgical changes from right total hip arthroplasty. Arthroplasty components appear in their expected alignment. Prior left total hip arthroplasty and fusion of the lumbosacral spine. No periprosthetic fracture is identified. Expected postoperative changes within the overlying soft tissues. IMPRESSION: Postsurgical changes from right hip arthroplasty. Electronically Signed   By: Yetta Glassman M.D.   On: 07/05/2022 12:46   DG HIP UNILAT WITH PELVIS 1V RIGHT  Result Date: 07/05/2022 CLINICAL DATA:  Right hip replaced EXAM: DG HIP (WITH OR WITHOUT PELVIS) 1V RIGHT COMPARISON:  Radiograph 12/21/2021 FINDINGS: Intraoperative images during right hip arthroplasty. Normal alignment without evidence of loosening or periprosthetic fracture. IMPRESSION: Intraoperative images during right hip arthroplasty. Normal alignment without evidence of immediate complication. Electronically Signed   By: Maurine Simmering M.D.   On: 07/05/2022 10:55   DG C-Arm 1-60 Min-No Report  Result Date: 07/05/2022 Fluoroscopy was utilized by the requesting  physician.  No radiographic interpretation.    Assessment/Plan  1. Primary osteoarthritis of right hip Led to #2  2. S/P total right hip arthroplasty WBAT On xarelto for DVT prevention and will transition to asa PT and OT Continue norco and neurontin  Reduce tylenol to bid to avoid exceeding max dose  3. Blood loss anemia 2 gram drop after surgery Continue to monitor.   4. Blood glucose elevated Follow with pcp   5. Slow transit constipation Trying metamucil and if this doesn't work will try MOM or supp Also on colace  Resident states she should not be on the supplements which were discontinued. Will have nurse perform med rec with patient. Also will need to taper neurontin later.   Family/ staff Communication: resident   Labs/tests ordered:   NA

## 2022-07-08 ENCOUNTER — Encounter: Payer: Self-pay | Admitting: Adult Health

## 2022-07-10 ENCOUNTER — Encounter: Payer: Self-pay | Admitting: Internal Medicine

## 2022-07-10 ENCOUNTER — Non-Acute Institutional Stay (SKILLED_NURSING_FACILITY): Payer: Medicare HMO | Admitting: Internal Medicine

## 2022-07-10 DIAGNOSIS — M5459 Other low back pain: Secondary | ICD-10-CM | POA: Diagnosis not present

## 2022-07-10 DIAGNOSIS — K5901 Slow transit constipation: Secondary | ICD-10-CM

## 2022-07-10 DIAGNOSIS — Z96641 Presence of right artificial hip joint: Secondary | ICD-10-CM | POA: Diagnosis not present

## 2022-07-10 DIAGNOSIS — R278 Other lack of coordination: Secondary | ICD-10-CM | POA: Diagnosis not present

## 2022-07-10 DIAGNOSIS — M62551 Muscle wasting and atrophy, not elsewhere classified, right thigh: Secondary | ICD-10-CM | POA: Diagnosis not present

## 2022-07-10 DIAGNOSIS — M1611 Unilateral primary osteoarthritis, right hip: Secondary | ICD-10-CM | POA: Diagnosis not present

## 2022-07-10 DIAGNOSIS — D500 Iron deficiency anemia secondary to blood loss (chronic): Secondary | ICD-10-CM | POA: Diagnosis not present

## 2022-07-10 DIAGNOSIS — G894 Chronic pain syndrome: Secondary | ICD-10-CM

## 2022-07-10 DIAGNOSIS — M25551 Pain in right hip: Secondary | ICD-10-CM | POA: Diagnosis not present

## 2022-07-10 DIAGNOSIS — R2689 Other abnormalities of gait and mobility: Secondary | ICD-10-CM | POA: Diagnosis not present

## 2022-07-10 DIAGNOSIS — Z471 Aftercare following joint replacement surgery: Secondary | ICD-10-CM | POA: Diagnosis not present

## 2022-07-10 NOTE — Progress Notes (Signed)
Provider:  Dr. Veleta Miners Location:  [facility] Room Number: Rehab 157A Place of Service:  SNF ([phone])  PCP: Midge Minium, MD Patient Care Team: Midge Minium, MD as PCP - General (Family Medicine) Paula Compton, MD as Consulting Physician (Obstetrics and Gynecology) Gloris Manchester, MD (Neurosurgery) Regal, Tamala Fothergill, DPM as Consulting Physician (Podiatry) Marica Otter, [place] (Optometry) Jovita Kussmaul, MD as Consulting Physician (General Surgery) Nicholas Lose, MD as Consulting Physician (Hematology and Oncology) Eppie Gibson, MD as Attending Physician (Radiation Oncology) Delice Bison, Charlestine Massed, NP as Nurse Practitioner (Hematology and Oncology) Irene Shipper, MD as Consulting Physician (Gastroenterology)  Extended Emergency Contact Information Primary Emergency Contact: [facility] M Address: [address] [place], [place] [address] [place] Phone: [phone] Mobile Phone: [phone] Relation: Spouse Secondary Emergency Contact: Larose Kells,  [place] Phone: [phone] Mobile Phone: [phone] Relation: Daughter  Code Status: Full Code Goals of Care: Advanced Directive information    07/07/2022   12:09 PM  Advanced Directives  Does Patient Have a Medical Advance Directive? No;Yes  Does patient want to make changes to medical advance directive? No - Patient declined      Chief Complaint  Patient presents with   New Admit To SNF    New Admit to Rehab    HPI: Patient is a 81 y.o. 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  Maureen Ralphs on 12/21/21  Admitted in rehab after undergoing right hip arthroplasty Her only complaint today is pain control.  Patient does have a history of chronic pain and wants to make sure her medications are scheduled Also complaining of some constipation and wants an order for MiraLAX Already walking with her walker and doing her ADLS Past Medical History:  Diagnosis Date   Arthritis    Cancer ([place])    left breast cancer   Chicken pox    GERD (gastroesophageal reflux disease)    History of radiation therapy 06/26/17- 07/24/17   Left Breast 50.05 Gy total   Personal history of radiation therapy    Shingles 07/2002   Sleep apnea    mild uses oral devise   Urinary tract infection    Past Surgical History:  Procedure Laterality Date   BREAST BIOPSY Bilateral 03/07/2017   benign   BREAST BIOPSY Right 03/26/2017   benign   BREAST BIOPSY Left 03/26/2017   malignant   BREAST LUMPECTOMY Left 05/03/2017   BREAST LUMPECTOMY WITH RADIOACTIVE SEED LOCALIZATION Left 05/03/2017   Procedure: LEFT BREAST LUMPECTOMY WITH RADIOACTIVE SEED LOCALIZATION;  Surgeon: Jovita Kussmaul, MD;  Location: [facility];  Service: General;  Laterality: Left;   COLONOSCOPY     CYST EXCISION Right 03/10/2022   Procedure: EXCISION SEBACEOUS CYST RIGHT HIP;  Surgeon: Jovita Kussmaul, MD;  Location: [facility];  Service: General;  Laterality: Right;   HYSTEROSCOPY     Dr Ubaldo Glassing   SPINAL FUSION  12/01/2016   T10 to Pelvis   TOTAL HIP ARTHROPLASTY Left 12/21/2021   Procedure: TOTAL HIP ARTHROPLASTY ANTERIOR APPROACH;  Surgeon: Wynelle Link,  Pilar Plate, MD;  Location: [facility];  Service: Orthopedics;  Laterality: Left;   TOTAL HIP ARTHROPLASTY Right 07/05/2022   Procedure: TOTAL HIP ARTHROPLASTY ANTERIOR APPROACH;  Surgeon: Gaynelle Arabian, MD;  Location: [facility];  Service: Orthopedics;  Laterality: Right;   UPPER GASTROINTESTINAL ENDOSCOPY  11/30/2020   WISDOM TOOTH EXTRACTION     age 17's    reports that she quit smoking  about 41 years ago. Her smoking use included cigarettes. She has never used smokeless tobacco. She reports current alcohol use of about 2.0 standard drinks of alcohol per week. She reports that she does not use drugs. Social History   Socioeconomic History   Marital status: Married    Spouse name: Not on file   Number of children: 2   Years of education: Not on file   Highest education level: Not on file  Occupational History   Occupation: retired  Tobacco Use   Smoking status: Former    Types: Cigarettes    Quit date: 02/18/1981    Years since quitting: 41.4   Smokeless tobacco: Never  Vaping Use   Vaping Use: Never used  Substance and Sexual Activity   Alcohol use: Yes    Alcohol/week: 2.0 standard drinks of alcohol    Types: 2 Glasses of wine per week    Comment: glass of wine daily   Drug use: No   Sexual activity: Yes  Other Topics Concern   Not on file  Social History Narrative   ** Merged History Encounter **       Social Determinants of Health   Financial Resource Strain: Low Risk  (07/04/2021)   Overall Financial Resource Strain (CARDIA)    Difficulty of Paying Living Expenses: Not hard at all  Food Insecurity: No Food Insecurity (07/04/2021)   Hunger Vital Sign    Worried About Running Out of Food in the Last Year: Never true    Ran Out of Food in the Last Year: Never true  Transportation Needs: No Transportation Needs (07/04/2021)   PRAPARE - Hydrologist (Medical): No    Lack of Transportation (Non-Medical): No  Physical Activity: Insufficiently Active (07/04/2021)   Exercise Vital Sign    Days of Exercise per Week: 4 days    Minutes of Exercise per Session: 30 min  Stress: No Stress Concern Present (07/04/2021)   [place]    Feeling of Stress : Not at all  Social Connections: [place] (07/04/2021)   Social Connection and Isolation Panel [NHANES]     Frequency of Communication with Friends and Family: More than three times a week    Frequency of Social Gatherings with Friends and Family: More than three times a week    Attends Religious Services: More than 4 times per year    Active Member of [employer] or Organizations: Yes    Attends Archivist Meetings: 1 to 4 times per year    Marital Status: Married  Human resources officer Violence: Not At Risk (07/04/2021)   Humiliation, Afraid, Rape, and Kick questionnaire    Fear of Current or Ex-Partner: No    Emotionally Abused: No    Physically Abused: No    Sexually Abused: No    Functional Status Survey:    Family History  Problem Relation Age of Onset   Diabetes Mother    Arthritis Father    Cancer Maternal Aunt        liver  Heart disease Paternal Grandfather    Prostate cancer Brother    Colon cancer Neg Hx    Pancreatic cancer Neg Hx    Stomach cancer Neg Hx     Health Maintenance  Topic Date Due   COVID-19 Vaccine (5 - Pfizer risk series) 12/29/2021   INFLUENZA VACCINE  06/20/2022   TETANUS/TDAP  10/09/2023   Pneumonia Vaccine 46+ Years old  Completed   DEXA SCAN  Completed   Hepatitis C Screening  Completed   Zoster Vaccines- Shingrix  Completed   HPV VACCINES  Aged Out    No Known Allergies  Outpatient Encounter Medications as of 07/10/2022  Medication Sig   acetaminophen (TYLENOL) 500 MG tablet Take 1,000 mg by mouth in the morning and at bedtime.   [START ON 07/27/2022] aspirin EC 81 MG tablet Take 81 mg by mouth daily. Swallow whole.   Biotin 5000 MCG TABS Take 5,000 mcg by mouth daily.   CRANBERRY PO Take 2 capsules by mouth in the morning, at noon, and at bedtime.   docusate sodium (COLACE) 100 MG capsule Take 100 mg by mouth 2 (two) times daily.   estradiol (ESTRACE) 0.1 MG/GM vaginal cream Place vaginally 3 (three) times a week.   gabapentin (NEURONTIN) 300 MG capsule TAKE 1 CAPSULE BY MOUTH FOUR TIMES A DAY   guaiFENesin (MUCINEX) 600 MG 12 hr tablet  Take 600 mg by mouth daily.   HYDROcodone-acetaminophen (NORCO/VICODIN) 5-325 MG tablet Take 1-2 tablets by mouth every 6 (six) hours as needed for severe pain.   loratadine (CLARITIN) 10 MG tablet Take 10 mg by mouth daily.   METAMUCIL FIBER PO Take 3-4 capsules by mouth See admin instructions. 3 caps in the morning, 4 caps in  the evening   methocarbamol (ROBAXIN) 500 MG tablet Take 1 tablet (500 mg total) by mouth every 6 (six) hours as needed for muscle spasms.   montelukast (SINGULAIR) 10 MG tablet TAKE 1 TABLET BY MOUTH EVERYDAY AT BEDTIME   Multiple Vitamins-Minerals (CENTRUM SILVER PO) Take 1 tablet by mouth daily.   omeprazole (PRILOSEC) 20 MG capsule Take 1 capsule (20 mg total) by mouth daily. Office visit for further refills   Probiotic Product (ADVANCED PROBIOTIC 10) CAPS Take 1 capsule by mouth daily.   rivaroxaban (XARELTO) 10 MG TABS tablet Take 1 tablet (10 mg total) by mouth daily with breakfast for 20 days. Then take one 81 mg aspirin once a day for 3 weeks. Then discontinue.   traMADol (ULTRAM) 50 MG tablet Take 1-2 tablets (50-100 mg total) by mouth every 6 (six) hours as needed for moderate pain (not controlled by Vicodin).   VITAMIN D PO Take 1,000 Units by mouth daily.   [DISCONTINUED] loratadine (CLARITIN) 10 MG tablet Take 10 mg by mouth daily.   No facility-administered encounter medications on file as of 07/10/2022.    Review of Systems  Constitutional:  Negative for activity change and appetite change.  HENT: Negative.    Respiratory:  Negative for cough and shortness of breath.   Cardiovascular:  Negative for leg swelling.  Gastrointestinal:  Negative for constipation.  Genitourinary: Negative.   Musculoskeletal:  Positive for arthralgias, gait problem and myalgias.  Skin: Negative.   Neurological:  Negative for dizziness and weakness.  Psychiatric/Behavioral:  Negative for confusion, dysphoric mood and sleep disturbance.     Vitals:   07/10/22 1021  BP:  (!) 145/79  Pulse: 79  Resp: 16  Temp: 98 F (36.7 C)  TempSrc: Skin  SpO2: 97%  Weight: 147 lb (66.7 kg)   Body mass index is 23.37 kg/m. Physical Exam Vitals reviewed.  Constitutional:      Appearance: Normal appearance.  HENT:     Head: Normocephalic.     Nose: Nose normal.     Mouth/Throat:     Mouth: Mucous membranes are moist.     Pharynx: Oropharynx is clear.  Eyes:     Pupils: Pupils are equal, round, and reactive to light.  Cardiovascular:     Rate and Rhythm: Normal rate and regular rhythm.     Pulses: Normal pulses.     Heart sounds: Normal heart sounds. No murmur heard. Pulmonary:     Effort: Pulmonary effort is normal.     Breath sounds: Normal breath sounds.  Abdominal:     General: Abdomen is flat. Bowel sounds are normal.     Palpations: Abdomen is soft.  Musculoskeletal:        General: No swelling.     Cervical back: Neck supple.  Skin:    General: Skin is warm.  Neurological:     General: No focal deficit present.     Mental Status: She is alert and oriented to person, place, and time.  Psychiatric:        Mood and Affect: Mood normal.        Thought Content: Thought content normal.     Labs reviewed: Basic Metabolic Panel: Recent Labs    03/06/22 1129 03/22/22 1313 07/06/22 0316  NA 130* 132* 135  K 4.8 4.2 3.6  CL 97 98 105  CO2 '26 27 26  '$ GLUCOSE 90 138* 138*  BUN '10 10 12  '$ CREATININE 0.68 0.74 0.73  CALCIUM 9.7 9.6 8.7*   Liver Function Tests: Recent Labs    08/12/21 1008 12/08/21 1458 03/06/22 1129  AST '19 24 19  '$ ALT '21 22 15  '$ ALKPHOS 56 56 74  BILITOT 0.7 0.6 0.5  PROT 7.6 7.1 7.4  ALBUMIN 4.9 4.2 4.5   No results for input(s): "LIPASE", "AMYLASE" in the last 8760 hours. No results for input(s): "AMMONIA" in the last 8760 hours. CBC: Recent Labs    08/12/21 1008 12/08/21 1458 01/06/22 1142 03/06/22 1129 06/22/22 1418 07/06/22 0316  WBC 9.8   < > 5.7 4.1 5.3 8.7  NEUTROABS 8.7*  --  4.6 2.7  --   --   HGB  12.6   < > 9.0* 12.4 11.3* 9.6*  HCT 37.4   < > 26.5* 36.2 33.5* 29.1*  MCV 98.3   < > 95.2 93.9 98.8 101.0*  PLT 326.0   < > 534.0 Repeated and verified X2.* 293.0 275 188   < > = values in this interval not displayed.   Cardiac Enzymes: No results for input(s): "CKTOTAL", "CKMB", "CKMBINDEX", "TROPONINI" in the last 8760 hours. BNP: Invalid input(s): "POCBNP" Lab Results  Component Value Date   HGBA1C 4.1 10/09/2013   Lab Results  Component Value Date   TSH 2.62 03/06/2022   No results found for: "VITAMINB12" No results found for: "FOLATE" No results found for: "IRON", "TIBC", "FERRITIN"  Imaging and Procedures obtained prior to SNF admission: DG Pelvis Portable  Result Date: 07/05/2022 CLINICAL DATA:  Postop right hip EXAM: PORTABLE PELVIS 1 VIEWS COMPARISON:  None Available. FINDINGS: Interval postsurgical changes from right total hip arthroplasty. Arthroplasty components appear in their expected alignment. Prior left total hip arthroplasty and fusion of the lumbosacral spine. No periprosthetic fracture is identified. Expected postoperative changes  within the overlying soft tissues. IMPRESSION: Postsurgical changes from right hip arthroplasty. Electronically Signed   By: Yetta Glassman M.D.   On: 07/05/2022 12:46   DG HIP UNILAT WITH PELVIS 1V RIGHT  Result Date: 07/05/2022 CLINICAL DATA:  Right hip replaced EXAM: DG HIP (WITH OR WITHOUT PELVIS) 1V RIGHT COMPARISON:  Radiograph 12/21/2021 FINDINGS: Intraoperative images during right hip arthroplasty. Normal alignment without evidence of loosening or periprosthetic fracture. IMPRESSION: Intraoperative images during right hip arthroplasty. Normal alignment without evidence of immediate complication. Electronically Signed   By: Maurine Simmering M.D.   On: 07/05/2022 10:55   DG C-Arm 1-60 Min-No Report  Result Date: 07/05/2022 Fluoroscopy was utilized by the requesting physician.  No radiographic interpretation.     Assessment/Plan 1. S/P total right hip arthroplasty WBAT On Xarelto for 20 days and then will do aspirin For Pain Want to schedule her Norco 2 tabs QID For 5 days and then taper to 1 tab QID Tylenol robaxin and Tramadol prn  2. Blood loss anemia Repeat CBC  3. Slow transit constipation Start Miralax  4. Chronic pain syndrome Pain Control as above Reval in 5 days to taper Norco eventually   Family/ staff Communication:   Labs/tests ordered:CBC I 1 week

## 2022-07-11 ENCOUNTER — Other Ambulatory Visit: Payer: Self-pay | Admitting: Orthopedic Surgery

## 2022-07-11 DIAGNOSIS — R2689 Other abnormalities of gait and mobility: Secondary | ICD-10-CM | POA: Diagnosis not present

## 2022-07-11 DIAGNOSIS — Z471 Aftercare following joint replacement surgery: Secondary | ICD-10-CM | POA: Diagnosis not present

## 2022-07-11 DIAGNOSIS — Z96641 Presence of right artificial hip joint: Secondary | ICD-10-CM

## 2022-07-11 DIAGNOSIS — M1611 Unilateral primary osteoarthritis, right hip: Secondary | ICD-10-CM | POA: Diagnosis not present

## 2022-07-11 DIAGNOSIS — M62551 Muscle wasting and atrophy, not elsewhere classified, right thigh: Secondary | ICD-10-CM | POA: Diagnosis not present

## 2022-07-11 DIAGNOSIS — R278 Other lack of coordination: Secondary | ICD-10-CM | POA: Diagnosis not present

## 2022-07-11 DIAGNOSIS — M5459 Other low back pain: Secondary | ICD-10-CM | POA: Diagnosis not present

## 2022-07-11 DIAGNOSIS — M6389 Disorders of muscle in diseases classified elsewhere, multiple sites: Secondary | ICD-10-CM | POA: Diagnosis not present

## 2022-07-11 DIAGNOSIS — M25551 Pain in right hip: Secondary | ICD-10-CM | POA: Diagnosis not present

## 2022-07-11 MED ORDER — HYDROCODONE-ACETAMINOPHEN 5-325 MG PO TABS: 1.0000 | ORAL_TABLET | Freq: Four times a day (QID) | ORAL | 0 refills | Status: DC | PRN

## 2022-07-12 DIAGNOSIS — M6389 Disorders of muscle in diseases classified elsewhere, multiple sites: Secondary | ICD-10-CM | POA: Diagnosis not present

## 2022-07-12 DIAGNOSIS — M25551 Pain in right hip: Secondary | ICD-10-CM | POA: Diagnosis not present

## 2022-07-12 DIAGNOSIS — M5459 Other low back pain: Secondary | ICD-10-CM | POA: Diagnosis not present

## 2022-07-12 DIAGNOSIS — Z471 Aftercare following joint replacement surgery: Secondary | ICD-10-CM | POA: Diagnosis not present

## 2022-07-12 DIAGNOSIS — M1611 Unilateral primary osteoarthritis, right hip: Secondary | ICD-10-CM | POA: Diagnosis not present

## 2022-07-12 DIAGNOSIS — R2689 Other abnormalities of gait and mobility: Secondary | ICD-10-CM | POA: Diagnosis not present

## 2022-07-12 DIAGNOSIS — M62551 Muscle wasting and atrophy, not elsewhere classified, right thigh: Secondary | ICD-10-CM | POA: Diagnosis not present

## 2022-07-12 DIAGNOSIS — R278 Other lack of coordination: Secondary | ICD-10-CM | POA: Diagnosis not present

## 2022-07-13 ENCOUNTER — Encounter: Payer: Self-pay | Admitting: Adult Health

## 2022-07-13 ENCOUNTER — Non-Acute Institutional Stay (SKILLED_NURSING_FACILITY): Payer: Medicare HMO | Admitting: Adult Health

## 2022-07-13 DIAGNOSIS — M1611 Unilateral primary osteoarthritis, right hip: Secondary | ICD-10-CM | POA: Diagnosis not present

## 2022-07-13 DIAGNOSIS — D500 Iron deficiency anemia secondary to blood loss (chronic): Secondary | ICD-10-CM | POA: Diagnosis not present

## 2022-07-13 DIAGNOSIS — Z96642 Presence of left artificial hip joint: Secondary | ICD-10-CM

## 2022-07-13 DIAGNOSIS — M25552 Pain in left hip: Secondary | ICD-10-CM

## 2022-07-13 DIAGNOSIS — M62551 Muscle wasting and atrophy, not elsewhere classified, right thigh: Secondary | ICD-10-CM | POA: Diagnosis not present

## 2022-07-13 DIAGNOSIS — R2689 Other abnormalities of gait and mobility: Secondary | ICD-10-CM | POA: Diagnosis not present

## 2022-07-13 DIAGNOSIS — R739 Hyperglycemia, unspecified: Secondary | ICD-10-CM

## 2022-07-13 DIAGNOSIS — K5903 Drug induced constipation: Secondary | ICD-10-CM | POA: Diagnosis not present

## 2022-07-13 DIAGNOSIS — R278 Other lack of coordination: Secondary | ICD-10-CM | POA: Diagnosis not present

## 2022-07-13 DIAGNOSIS — Z471 Aftercare following joint replacement surgery: Secondary | ICD-10-CM | POA: Diagnosis not present

## 2022-07-13 DIAGNOSIS — M5459 Other low back pain: Secondary | ICD-10-CM | POA: Diagnosis not present

## 2022-07-13 DIAGNOSIS — M25551 Pain in right hip: Secondary | ICD-10-CM | POA: Diagnosis not present

## 2022-07-13 MED ORDER — METHOCARBAMOL 500 MG PO TABS: 500.0000 mg | ORAL_TABLET | Freq: Four times a day (QID) | ORAL | 0 refills | Status: DC | PRN

## 2022-07-13 MED ORDER — HYDROCODONE-ACETAMINOPHEN 5-325 MG PO TABS: 2.0000 | ORAL_TABLET | Freq: Four times a day (QID) | ORAL | 0 refills | Status: DC

## 2022-07-13 NOTE — Progress Notes (Signed)
Location:  [place] Room Number: 157/a Place of Service:  SNF [phone])  Provider: Royal Hawthorn NP  PCP: Midge Minium, MD Patient Care Team: Midge Minium, MD as PCP - General (Family Medicine) Paula Compton, MD as Consulting Physician (Obstetrics and Gynecology) Gloris Manchester, MD (Neurosurgery) Paulla Dolly Tamala Fothergill, DPM as Consulting Physician (Podiatry) Marica Otter, [place] (Optometry) Jovita Kussmaul, MD as Consulting Physician (General Surgery) Nicholas Lose, MD as Consulting Physician (Hematology and Oncology) Eppie Gibson, MD as Attending Physician (Radiation Oncology) Gardenia Phlegm, NP as Nurse Practitioner (Hematology and Oncology) Irene Shipper, MD as Consulting Physician (Gastroenterology)  Extended Emergency Contact Information Primary Emergency Contact: [facility] M Address: [address] [place], [place] [address] [place] Phone: [phone] Mobile Phone: [phone] Relation: Spouse Secondary Emergency Contact: Larose K[place], [place] Phone: [phone] Mobile Phone: [phone] Relation: Daughter  Code Status: Full Code Goals of care:  Advanced Directive information    07/13/2022   11:03 AM  Advanced Directives  Does Patient Have a Medical Advance Directive? Yes  Type of Paramedic of [place];Living will  Does patient want to make changes to medical advance directive? No - Patient declined  Copy of [place] in Chart? Yes - validated most recent copy scanned in chart (See row information)     No Known Allergies  Chief Complaint  Patient presents with   Discharge Note    Discharge from [facility]    HPI:  81 y.o. female for discharge from [facility] skilled rehab back to [place].   PMH significant for severe OA, breast ca, mild sleep apnea, GERD. She is admitted to  [facility] s/p right hip replacement with anterior approach due to severe OA on 07/05/22.  Surgery was delayed at one point due to infected sebaceous cyst on the right hip area. She previously had her left hip replaced earlier this year.  She has worked with therapy and is ambulating well. Did have some constipation but this resolved this morning.  She has some chronic back pain  and that has been bothering her during her stay along with the right hip pain. She is taking two norco every six hours and sometimes it wears off in between.    Hgb 9.6 postoperatively, was 11.3 on 06/22/22.    Past Medical History:  Diagnosis Date   Arthritis    Cancer ([place])    left breast cancer   Chicken pox    GERD (gastroesophageal reflux disease)    History of radiation therapy 06/26/17- 07/24/17   Left Breast 50.05 Gy total   Personal history of radiation therapy    Shingles 07/2002   Sleep apnea    mild uses oral devise   Urinary tract infection     Past Surgical History:  Procedure Laterality Date   BREAST BIOPSY Bilateral 03/07/2017   benign   BREAST BIOPSY Right 03/26/2017   benign   BREAST BIOPSY Left 03/26/2017   malignant   BREAST LUMPECTOMY Left 05/03/2017   BREAST LUMPECTOMY WITH RADIOACTIVE SEED LOCALIZATION Left 05/03/2017   Procedure: LEFT BREAST LUMPECTOMY WITH RADIOACTIVE SEED LOCALIZATION;  Surgeon: Jovita Kussmaul, MD;  Location: [place];  Service: General;  Laterality: Left;   COLONOSCOPY     CYST EXCISION Right 03/10/2022   Procedure: EXCISION SEBACEOUS CYST RIGHT HIP;  Surgeon:  Jovita Kussmaul, MD;  Location: [place] [facility];  Service: General;  Laterality: Right;   HYSTEROSCOPY     Dr Ubaldo Glassing   SPINAL FUSION  12/01/2016   T10 to Pelvis   TOTAL HIP ARTHROPLASTY Left 12/21/2021   Procedure: TOTAL HIP ARTHROPLASTY ANTERIOR APPROACH;  Surgeon: Gaynelle Arabian, MD;  Location: [facility];  Service: Orthopedics;  Laterality: Left;   TOTAL HIP ARTHROPLASTY Right 07/05/2022    Procedure: TOTAL HIP ARTHROPLASTY ANTERIOR APPROACH;  Surgeon: Gaynelle Arabian, MD;  Location: [facility];  Service: Orthopedics;  Laterality: Right;   UPPER GASTROINTESTINAL ENDOSCOPY  11/30/2020   WISDOM TOOTH EXTRACTION     age 40's      reports that she quit smoking about 41 years ago. Her smoking use included cigarettes. She has never used smokeless tobacco. She reports current alcohol use of about 2.0 standard drinks of alcohol per week. She reports that she does not use drugs. Social History   Socioeconomic History   Marital status: Married    Spouse name: Not on file   Number of children: 2   Years of education: Not on file   Highest education level: Not on file  Occupational History   Occupation: retired  Tobacco Use   Smoking status: Former    Types: Cigarettes    Quit date: 02/18/1981    Years since quitting: 41.4   Smokeless tobacco: Never  Vaping Use   Vaping Use: Never used  Substance and Sexual Activity   Alcohol use: Yes    Alcohol/week: 2.0 standard drinks of alcohol    Types: 2 Glasses of wine per week    Comment: glass of wine daily   Drug use: No   Sexual activity: Yes  Other Topics Concern   Not on file  Social History Narrative   ** Merged History Encounter **       Social Determinants of Health   Financial Resource Strain: Low Risk  (07/04/2021)   Overall Financial Resource Strain (CARDIA)    Difficulty of Paying Living Expenses: Not hard at all  Food Insecurity: No Food Insecurity (07/04/2021)   Hunger Vital Sign    Worried About Running Out of Food in the Last Year: Never true    Ran Out of Food in the Last Year: Never true  Transportation Needs: No Transportation Needs (07/04/2021)   PRAPARE - Hydrologist (Medical): No    Lack of Transportation (Non-Medical): No  Physical Activity: Insufficiently Active (07/04/2021)   Exercise Vital Sign    Days of Exercise per Week: 4 days    Minutes of Exercise per Session: 30 min   Stress: No Stress Concern Present (07/04/2021)   [place]    Feeling of Stress : Not at all  Social Connections: [place] (07/04/2021)   Social Connection and Isolation Panel [NHANES]    Frequency of Communication with Friends and Family: More than three times a week    Frequency of Social Gatherings with Friends and Family: More than three times a week    Attends Religious Services: More than 4 times per year    Active Member of [employer] or Organizations: Yes    Attends Archivist Meetings: 1 to 4 times per year    Marital Status: Married  Human resources officer Violence: Not At Risk (07/04/2021)   Humiliation, Afraid, Rape, and Kick questionnaire    Fear of Current or Ex-Partner: No  Emotionally Abused: No    Physically Abused: No    Sexually Abused: No   Functional Status Survey:    No Known Allergies  Pertinent  Health Maintenance Due  Topic Date Due   INFLUENZA VACCINE  06/20/2022   DEXA SCAN  Completed    Medications: Outpatient Encounter Medications as of 07/13/2022  Medication Sig   acetaminophen (TYLENOL) 325 MG tablet Take 650 mg by mouth every 6 (six) hours as needed.   docusate sodium (COLACE) 100 MG capsule Take 100 mg by mouth 2 (two) times daily.   estradiol (ESTRACE) 0.1 MG/GM vaginal cream Place vaginally 3 (three) times a week.   gabapentin (NEURONTIN) 300 MG capsule TAKE 1 CAPSULE BY MOUTH FOUR TIMES A DAY   guaiFENesin (MUCINEX) 600 MG 12 hr tablet Take 600 mg by mouth every 12 (twelve) hours.   HYDROcodone-acetaminophen (NORCO/VICODIN) 5-325 MG tablet Take 2 tablets by mouth 4 (four) times daily.   loratadine (CLARITIN) 10 MG tablet Take 10 mg by mouth daily.   METAMUCIL FIBER PO Take by mouth See admin instructions. 3 caps in the morning, 4 caps in  the evening   methocarbamol (ROBAXIN) 500 MG tablet Take 1 tablet (500 mg total) by mouth every 6 (six) hours as needed for  muscle spasms.   montelukast (SINGULAIR) 10 MG tablet TAKE 1 TABLET BY MOUTH EVERYDAY AT BEDTIME   omeprazole (PRILOSEC) 20 MG capsule Take 1 capsule (20 mg total) by mouth daily. Office visit for further refills   polyethylene glycol (MIRALAX / GLYCOLAX) 17 g packet Take 17 g by mouth as needed.   Probiotic Product (ADVANCED PROBIOTIC 10) CAPS Take 1 capsule by mouth daily.   rivaroxaban (XARELTO) 10 MG TABS tablet Take 1 tablet (10 mg total) by mouth daily with breakfast for 20 days. Then take one 81 mg aspirin once a day for 3 weeks. Then discontinue.   traMADol (ULTRAM) 50 MG tablet Take 50 mg by mouth every 6 (six) hours as needed.   [START ON 07/27/2022] aspirin EC 81 MG tablet Take 81 mg by mouth daily. Swallow whole. (Patient not taking: Reported on 07/13/2022)   [DISCONTINUED] acetaminophen (TYLENOL) 500 MG tablet Take 1,000 mg by mouth in the morning and at bedtime.   [DISCONTINUED] Biotin 5000 MCG TABS Take 5,000 mcg by mouth daily.   [DISCONTINUED] CRANBERRY PO Take 2 capsules by mouth in the morning, at noon, and at bedtime.   [DISCONTINUED] HYDROcodone-acetaminophen (NORCO/VICODIN) 5-325 MG tablet Take 1-2 tablets by mouth every 6 (six) hours as needed for severe pain.   [DISCONTINUED] Multiple Vitamins-Minerals (CENTRUM SILVER PO) Take 1 tablet by mouth daily.   [DISCONTINUED] traMADol (ULTRAM) 50 MG tablet Take 1-2 tablets (50-100 mg total) by mouth every 6 (six) hours as needed for moderate pain (not controlled by Vicodin).   [DISCONTINUED] VITAMIN D PO Take 1,000 Units by mouth daily.   No facility-administered encounter medications on file as of 07/13/2022.    Review of Systems  Constitutional:  Negative for activity change, appetite change, chills, diaphoresis, fatigue, fever and unexpected weight change.  HENT:  Negative for congestion.   Respiratory:  Negative for cough, shortness of breath and wheezing.   Cardiovascular:  Negative for chest pain, palpitations and leg  swelling.  Gastrointestinal:  Positive for constipation. Negative for abdominal distention, abdominal pain and diarrhea.  Genitourinary:  Negative for difficulty urinating and dysuria.  Musculoskeletal:  Positive for arthralgias, back pain and gait problem. Negative for joint swelling and myalgias.  Neurological:  Negative for dizziness, tremors, seizures, syncope, facial asymmetry, speech difficulty, weakness, light-headedness, numbness and headaches.  Psychiatric/Behavioral:  Negative for agitation, behavioral problems and confusion.     Vitals:   07/13/22 1058  BP: (!) 144/81  Pulse: 74  Resp: 13  Temp: 97.6 F (36.4 C)  SpO2: 99%  Weight: 145 lb (65.8 kg)  Height: 5' 6.5" (1.689 m)   Body mass index is 23.05 kg/m. Physical Exam Constitutional:      Appearance: Normal appearance.  Musculoskeletal:     Right lower leg: Edema present.     Left lower leg: No edema.  Skin:    General: Skin is warm and dry.  Neurological:     General: No focal deficit present.     Mental Status: She is alert and oriented to person, place, and time.     Labs reviewed: Basic Metabolic Panel: Recent Labs    03/06/22 1129 03/22/22 1313 07/06/22 0316  NA 130* 132* 135  K 4.8 4.2 3.6  CL 97 98 105  CO2 '26 27 26  '$ GLUCOSE 90 138* 138*  BUN '10 10 12  '$ CREATININE 0.68 0.74 0.73  CALCIUM 9.7 9.6 8.7*   Liver Function Tests: Recent Labs    08/12/21 1008 12/08/21 1458 03/06/22 1129  AST '19 24 19  '$ ALT '21 22 15  '$ ALKPHOS 56 56 74  BILITOT 0.7 0.6 0.5  PROT 7.6 7.1 7.4  ALBUMIN 4.9 4.2 4.5   No results for input(s): "LIPASE", "AMYLASE" in the last 8760 hours. No results for input(s): "AMMONIA" in the last 8760 hours. CBC: Recent Labs    08/12/21 1008 12/08/21 1458 01/06/22 1142 03/06/22 1129 06/22/22 1418 07/06/22 0316  WBC 9.8   < > 5.7 4.1 5.3 8.7  NEUTROABS 8.7*  --  4.6 2.7  --   --   HGB 12.6   < > 9.0* 12.4 11.3* 9.6*  HCT 37.4   < > 26.5* 36.2 33.5* 29.1*  MCV 98.3    < > 95.2 93.9 98.8 101.0*  PLT 326.0   < > 534.0 Repeated and verified X2.* 293.0 275 188   < > = values in this interval not displayed.   Cardiac Enzymes: No results for input(s): "CKTOTAL", "CKMB", "CKMBINDEX", "TROPONINI" in the last 8760 hours. BNP: Invalid input(s): "POCBNP" CBG: No results for input(s): "GLUCAP" in the last 8760 hours.  Procedures and Imaging Studies During Stay: DG Pelvis Portable  Result Date: 07/05/2022 CLINICAL DATA:  Postop right hip EXAM: PORTABLE PELVIS 1 VIEWS COMPARISON:  None Available. FINDINGS: Interval postsurgical changes from right total hip arthroplasty. Arthroplasty components appear in their expected alignment. Prior left total hip arthroplasty and fusion of the lumbosacral spine. No periprosthetic fracture is identified. Expected postoperative changes within the overlying soft tissues. IMPRESSION: Postsurgical changes from right hip arthroplasty. Electronically Signed   By: Yetta Glassman M.D.   On: 07/05/2022 12:46   DG HIP UNILAT WITH PELVIS 1V RIGHT  Result Date: 07/05/2022 CLINICAL DATA:  Right hip replaced EXAM: DG HIP (WITH OR WITHOUT PELVIS) 1V RIGHT COMPARISON:  Radiograph 12/21/2021 FINDINGS: Intraoperative images during right hip arthroplasty. Normal alignment without evidence of loosening or periprosthetic fracture. IMPRESSION: Intraoperative images during right hip arthroplasty. Normal alignment without evidence of immediate complication. Electronically Signed   By: Maurine Simmering M.D.   On: 07/05/2022 10:55   DG C-Arm 1-60 Min-No Report  Result Date: 07/05/2022 Fluoroscopy was utilized by the requesting physician.  No radiographic interpretation.    Assessment/Plan:  1. Primary osteoarthritis of right hip Led to #2  2. S/P total right hip arthroplasty WBAT, has improved and ready for discharge On xarelto for DVT prevention and will transition to asa Will try to taper norco however she is still reports significant pain Norco 2  tabs qid for 5 days, then 1 tab qid for 5 days, then 1 tab qid prn Continue tylenol prn but do not exceed 3 grams in 24 hrs Increase robaxin to 867 [id number] 1992 mg tid prn  D/cultram  3. Blood loss anemia Due to surgery, will need repeat CBC out patient.   4. Blood glucose elevated Follow with pcp   5. Slow transit constipation Improved  Patient is being discharged with the following home health services:  PT and OT  Patient is being discharged with the following durable medical equipment:  NA  Patient has been advised to f/u with their PCP in 1-2 weeks to for a transitions of care visit.  Social services at their facility was responsible for arranging this appointment.  Pt was provided with adequate prescriptions of noncontrolled medications to reach the scheduled appointment .  For controlled substances, a limited supply was provided as appropriate for the individual patient.  If the pt normally receives these medications from a pain clinic or has a contract with another physician, these medications should be received from that clinic or physician only).    Future labs/tests needed:  CBC    Discharge review, assessment, plan, and coordination took >30 min

## 2022-07-18 DIAGNOSIS — M5459 Other low back pain: Secondary | ICD-10-CM | POA: Diagnosis not present

## 2022-07-18 DIAGNOSIS — M6389 Disorders of muscle in diseases classified elsewhere, multiple sites: Secondary | ICD-10-CM | POA: Diagnosis not present

## 2022-07-18 DIAGNOSIS — R2689 Other abnormalities of gait and mobility: Secondary | ICD-10-CM | POA: Diagnosis not present

## 2022-07-18 DIAGNOSIS — R278 Other lack of coordination: Secondary | ICD-10-CM | POA: Diagnosis not present

## 2022-07-18 DIAGNOSIS — M25551 Pain in right hip: Secondary | ICD-10-CM | POA: Diagnosis not present

## 2022-07-18 DIAGNOSIS — D509 Iron deficiency anemia, unspecified: Secondary | ICD-10-CM | POA: Diagnosis not present

## 2022-07-18 DIAGNOSIS — M1611 Unilateral primary osteoarthritis, right hip: Secondary | ICD-10-CM | POA: Diagnosis not present

## 2022-07-18 DIAGNOSIS — M62551 Muscle wasting and atrophy, not elsewhere classified, right thigh: Secondary | ICD-10-CM | POA: Diagnosis not present

## 2022-07-18 DIAGNOSIS — Z471 Aftercare following joint replacement surgery: Secondary | ICD-10-CM | POA: Diagnosis not present

## 2022-07-18 LAB — CBC AND DIFFERENTIAL
HCT: 30 — AB (ref 36–46)
Hemoglobin: 10.3 — AB (ref 12.0–16.0)
Platelets: 584 10*3/uL — AB (ref 150–400)
WBC: 6.5

## 2022-07-18 LAB — CBC: RBC: 3.04 — AB (ref 3.87–5.11)

## 2022-07-19 DIAGNOSIS — M6389 Disorders of muscle in diseases classified elsewhere, multiple sites: Secondary | ICD-10-CM | POA: Diagnosis not present

## 2022-07-19 DIAGNOSIS — M5459 Other low back pain: Secondary | ICD-10-CM | POA: Diagnosis not present

## 2022-07-19 DIAGNOSIS — M1611 Unilateral primary osteoarthritis, right hip: Secondary | ICD-10-CM | POA: Diagnosis not present

## 2022-07-19 DIAGNOSIS — Z471 Aftercare following joint replacement surgery: Secondary | ICD-10-CM | POA: Diagnosis not present

## 2022-07-19 DIAGNOSIS — R278 Other lack of coordination: Secondary | ICD-10-CM | POA: Diagnosis not present

## 2022-07-21 DIAGNOSIS — R2689 Other abnormalities of gait and mobility: Secondary | ICD-10-CM | POA: Diagnosis not present

## 2022-07-21 DIAGNOSIS — R278 Other lack of coordination: Secondary | ICD-10-CM | POA: Diagnosis not present

## 2022-07-21 DIAGNOSIS — M62551 Muscle wasting and atrophy, not elsewhere classified, right thigh: Secondary | ICD-10-CM | POA: Diagnosis not present

## 2022-07-21 DIAGNOSIS — M25551 Pain in right hip: Secondary | ICD-10-CM | POA: Diagnosis not present

## 2022-07-21 DIAGNOSIS — M6389 Disorders of muscle in diseases classified elsewhere, multiple sites: Secondary | ICD-10-CM | POA: Diagnosis not present

## 2022-07-21 DIAGNOSIS — M1611 Unilateral primary osteoarthritis, right hip: Secondary | ICD-10-CM | POA: Diagnosis not present

## 2022-07-21 DIAGNOSIS — M5459 Other low back pain: Secondary | ICD-10-CM | POA: Diagnosis not present

## 2022-07-21 DIAGNOSIS — Z471 Aftercare following joint replacement surgery: Secondary | ICD-10-CM | POA: Diagnosis not present

## 2022-07-26 DIAGNOSIS — M1611 Unilateral primary osteoarthritis, right hip: Secondary | ICD-10-CM | POA: Diagnosis not present

## 2022-07-26 DIAGNOSIS — M6389 Disorders of muscle in diseases classified elsewhere, multiple sites: Secondary | ICD-10-CM | POA: Diagnosis not present

## 2022-07-26 DIAGNOSIS — M25551 Pain in right hip: Secondary | ICD-10-CM | POA: Diagnosis not present

## 2022-07-26 DIAGNOSIS — M62551 Muscle wasting and atrophy, not elsewhere classified, right thigh: Secondary | ICD-10-CM | POA: Diagnosis not present

## 2022-07-26 DIAGNOSIS — R278 Other lack of coordination: Secondary | ICD-10-CM | POA: Diagnosis not present

## 2022-07-26 DIAGNOSIS — M5459 Other low back pain: Secondary | ICD-10-CM | POA: Diagnosis not present

## 2022-07-26 DIAGNOSIS — R2689 Other abnormalities of gait and mobility: Secondary | ICD-10-CM | POA: Diagnosis not present

## 2022-07-26 DIAGNOSIS — Z471 Aftercare following joint replacement surgery: Secondary | ICD-10-CM | POA: Diagnosis not present

## 2022-07-31 ENCOUNTER — Encounter (INDEPENDENT_AMBULATORY_CARE_PROVIDER_SITE_OTHER): Payer: Medicare HMO | Admitting: Family Medicine

## 2022-07-31 DIAGNOSIS — G8928 Other chronic postprocedural pain: Secondary | ICD-10-CM

## 2022-08-01 ENCOUNTER — Ambulatory Visit (INDEPENDENT_AMBULATORY_CARE_PROVIDER_SITE_OTHER): Payer: Medicare HMO

## 2022-08-01 VITALS — Ht 66.0 in | Wt 145.0 lb

## 2022-08-01 DIAGNOSIS — Z0000 Encounter for general adult medical examination without abnormal findings: Secondary | ICD-10-CM

## 2022-08-01 NOTE — Patient Instructions (Signed)
Ms. Elizabeth [name] , Thank you for taking time to come for your Medicare Wellness Visit. I appreciate your ongoing commitment to your health goals. Please review the following plan we discussed and let me know if I can assist you in the future.   Screening recommendations/referrals: Colonoscopy: no longer required  Mammogram: no longer required  Bone Density: 05/31/2021 Recommended yearly ophthalmology/optometry visit for glaucoma screening and checkup Recommended yearly dental visit for hygiene and checkup  Vaccinations: Influenza vaccine: due in fall  Pneumococcal vaccine: completed  Tdap vaccine: 10/08/2013 Shingles vaccine: completed Covid-19:completed   Advanced directives: Please bring a copy of your health care power of attorney and living will to the office to be added to your chart at your convenience.   Conditions/risks identified: Aim for 30 minutes of exercise or brisk walking, 6-8 glasses of water, and 5 servings of fruits and vegetables each day.   Next appointment: Follow up in one year for your annual wellness visit    Preventive Care 65 Years and Older, Female Preventive care refers to lifestyle choices and visits with your health care provider that can promote health and wellness. What does preventive care include? A yearly physical exam. This is also called an annual well check. Dental exams once or twice a year. Routine eye exams. Ask your health care provider how often you should have your eyes checked. Personal lifestyle choices, including: Daily care of your teeth and gums. Regular physical activity. Eating a healthy diet. Avoiding tobacco and drug use. Limiting alcohol use. Practicing safe sex. Taking low-dose aspirin every day. Taking vitamin and mineral supplements as recommended by your health care provider. What happens during an annual well check? The services and screenings done by your health care provider during your annual well check will depend on  your age, overall health, lifestyle risk factors, and family history of disease. Counseling  Your health care provider may ask you questions about your: Alcohol use. Tobacco use. Drug use. Emotional well-being. Home and relationship well-being. Sexual activity. Eating habits. History of falls. Memory and ability to understand (cognition). Work and work Statistician. Reproductive health. Screening  You may have the following tests or measurements: Height, weight, and BMI. Blood pressure. Lipid and cholesterol levels. These may be checked every 5 years, or more frequently if you are over 65 years old. Skin check. Lung cancer screening. You may have this screening every year starting at age 59 if you have a 30-pack-year history of smoking and currently smoke or have quit within the past 15 years. Fecal occult blood test (FOBT) of the stool. You may have this test every year starting at age 35. Flexible sigmoidoscopy or colonoscopy. You may have a sigmoidoscopy every 5 years or a colonoscopy every 10 years starting at age 43. Hepatitis C blood test. Hepatitis B blood test. Sexually transmitted disease (STD) testing. Diabetes screening. This is done by checking your blood sugar (glucose) after you have not eaten for a while (fasting). You may have this done every 1-3 years. Bone density scan. This is done to screen for osteoporosis. You may have this done starting at age 52. Mammogram. This may be done every 1-2 years. Talk to your health care provider about how often you should have regular mammograms. Talk with your health care provider about your test results, treatment options, and if necessary, the need for more tests. Vaccines  Your health care provider may recommend certain vaccines, such as: Influenza vaccine. This is recommended every year. Tetanus, diphtheria, and acellular  pertussis (Tdap, Td) vaccine. You may need a Td booster every 10 years. Zoster vaccine. You may need this  after age 28. Pneumococcal 13-valent conjugate (PCV13) vaccine. One dose is recommended after age 60. Pneumococcal polysaccharide (PPSV23) vaccine. One dose is recommended after age 54. Talk to your health care provider about which screenings and vaccines you need and how often you need them. This information is not intended to replace advice given to you by your health care provider. Make sure you discuss any questions you have with your health care provider. Document Released: 12/03/2015 Document Revised: 07/26/2016 Document Reviewed: 09/07/2015 Elsevier Interactive Patient Education  2017 [employer] Prevention in the Home Falls can cause injuries. They can happen to people of all ages. There are many things you can do to make your home safe and to help prevent falls. What can I do on the outside of my home? Regularly fix the edges of walkways and driveways and fix any cracks. Remove anything that might make you trip as you walk through a door, such as a raised step or threshold. Trim any bushes or trees on the path to your home. Use bright outdoor lighting. Clear any walking paths of anything that might make someone trip, such as rocks or tools. Regularly check to see if handrails are loose or broken. Make sure that both sides of any steps have handrails. Any raised decks and porches should have guardrails on the edges. Have any leaves, snow, or ice cleared regularly. Use sand or salt on walking paths during winter. Clean up any spills in your garage right away. This includes oil or grease spills. What can I do in the bathroom? Use night lights. Install grab bars by the toilet and in the tub and shower. Do not use towel bars as grab bars. Use non-skid mats or decals in the tub or shower. If you need to sit down in the shower, use a plastic, non-slip stool. Keep the floor dry. Clean up any water that spills on the floor as soon as it happens. Remove soap buildup in the tub or  shower regularly. Attach bath mats securely with double-sided non-slip rug tape. Do not have throw rugs and other things on the floor that can make you trip. What can I do in the bedroom? Use night lights. Make sure that you have a light by your bed that is easy to reach. Do not use any sheets or blankets that are too big for your bed. They should not hang down onto the floor. Have a firm chair that has side arms. You can use this for support while you get dressed. Do not have throw rugs and other things on the floor that can make you trip. What can I do in the kitchen? Clean up any spills right away. Avoid walking on wet floors. Keep items that you use a lot in easy-to-reach places. If you need to reach something above you, use a strong step stool that has a grab bar. Keep electrical cords out of the way. Do not use floor polish or wax that makes floors slippery. If you must use wax, use non-skid floor wax. Do not have throw rugs and other things on the floor that can make you trip. What can I do with my stairs? Do not leave any items on the stairs. Make sure that there are handrails on both sides of the stairs and use them. Fix handrails that are broken or loose. Make sure that handrails  are as long as the stairways. Check any carpeting to make sure that it is firmly attached to the stairs. Fix any carpet that is loose or worn. Avoid having throw rugs at the top or bottom of the stairs. If you do have throw rugs, attach them to the floor with carpet tape. Make sure that you have a light switch at the top of the stairs and the bottom of the stairs. If you do not have them, ask someone to add them for you. What else can I do to help prevent falls? Wear shoes that: Do not have high heels. Have rubber bottoms. Are comfortable and fit you well. Are closed at the toe. Do not wear sandals. If you use a stepladder: Make sure that it is fully opened. Do not climb a closed stepladder. Make  sure that both sides of the stepladder are locked into place. Ask someone to hold it for you, if possible. Clearly mark and make sure that you can see: Any grab bars or handrails. First and last steps. Where the edge of each step is. Use tools that help you move around (mobility aids) if they are needed. These include: Canes. Walkers. Scooters. Crutches. Turn on the lights when you go into a dark area. Replace any light bulbs as soon as they burn out. Set up your furniture so you have a clear path. Avoid moving your furniture around. If any of your floors are uneven, fix them. If there are any pets around you, be aware of where they are. Review your medicines with your doctor. Some medicines can make you feel dizzy. This can increase your chance of falling. Ask your doctor what other things that you can do to help prevent falls. This information is not intended to replace advice given to you by your health care provider. Make sure you discuss any questions you have with your health care provider. Document Released: 09/02/2009 Document Revised: 04/13/2016 Document Reviewed: 12/11/2014 Elsevier Interactive Patient Education  2017 [employer].

## 2022-08-01 NOTE — Progress Notes (Signed)
Subjective:   Elizabeth [name] is a 81 y.o. female who presents for Medicare Annual (Subsequent) preventive examination.   Virtual Visit via Telephone Note  I connected with  Elizabeth [name] on 08/01/22 at  3:30 PM EDT by telephone and verified that I am speaking with the correct person using two identifiers.  Location: Patient: Home  Provider: [place]  Persons participating in the virtual visit: patient/Nurse Health Advisor   I discussed the limitations, risks, security and privacy concerns of performing an evaluation and management service by telephone and the availability of in person appointments. The patient expressed understanding and agreed to proceed.  Interactive audio and video telecommunications were attempted between this nurse and patient, however failed, due to patient having technical difficulties OR patient did not have access to video capability.  We continued and completed visit with audio only.  Some vital signs may be absent or patient reported.   Daphane Shepherd, LPN  Review of Systems     Cardiac Risk Factors include: advanced age (>27mn, >>52women)     Objective:    Today's Vitals   08/01/22 1547  Weight: 145 lb (65.8 kg)  Height: '5\' 6"'$  (1.676 m)   Body mass index is 23.4 kg/m.     08/01/2022    3:53 PM 07/13/2022   11:03 AM 07/07/2022   12:09 PM 07/05/2022   12:00 PM 06/22/2022    1:59 PM 03/10/2022   11:49 AM 01/02/2022    1:15 PM  Advanced Directives  Does Patient Have a Medical Advance Directive? Yes Yes No;Yes No;Yes Yes Yes Yes  Type of AParamedicof [place] JunctionLiving will H[place]Living will   HSacLiving will HIsabelaLiving will HFalmanLiving will  Does patient want to make changes to medical advance directive? No - Patient declined No - Patient declined No - Patient declined No - Patient declined   No - Patient  declined  Copy of HBoonvillein Chart? No - copy requested Yes - validated most recent copy scanned in chart (See row information)    Yes - validated most recent copy scanned in chart (See row information) Yes - validated most recent copy scanned in chart (See row information)    Current Medications (verified) Outpatient Encounter Medications as of 08/01/2022  Medication Sig   acetaminophen (TYLENOL) 325 MG tablet Take 650 mg by mouth every 6 (six) hours as needed.   aspirin EC 81 MG tablet Take 81 mg by mouth daily. Swallow whole.   docusate sodium (COLACE) 100 MG capsule Take 100 mg by mouth 2 (two) times daily.   estradiol (ESTRACE) 0.1 MG/GM vaginal cream Place vaginally 3 (three) times a week.   gabapentin (NEURONTIN) 300 MG capsule TAKE 1 CAPSULE BY MOUTH FOUR TIMES A DAY   HYDROcodone-acetaminophen (NORCO/VICODIN) 5-325 MG tablet Take 2 tablets by mouth 4 (four) times daily. 2 tablets four times a day for 5 days, then 1 tablet four times a day for 5 days then 1 tablet four times a day as needed for 5 days then discontinue.   loratadine (CLARITIN) 10 MG tablet Take 10 mg by mouth daily.   METAMUCIL FIBER PO Take by mouth See admin instructions. 3 caps in the morning, 4 caps in  the evening   methocarbamol (ROBAXIN) 500 MG tablet Take 1-2 tablets (500-1,000 mg total) by mouth 4 (four) times daily as needed for muscle spasms.  montelukast (SINGULAIR) 10 MG tablet TAKE 1 TABLET BY MOUTH EVERYDAY AT BEDTIME   omeprazole (PRILOSEC) 20 MG capsule Take 1 capsule (20 mg total) by mouth daily. Office visit for further refills   Probiotic Product (ADVANCED PROBIOTIC 10) CAPS Take 1 capsule by mouth daily.   guaiFENesin (MUCINEX) 600 MG 12 hr tablet Take 600 mg by mouth every 12 (twelve) hours.   polyethylene glycol (MIRALAX / GLYCOLAX) 17 g packet Take 17 g by mouth as needed.   No facility-administered encounter medications on file as of 08/01/2022.    Allergies  (verified) Patient has no known allergies.   History: Past Medical History:  Diagnosis Date   Arthritis    Cancer ([place])    left breast cancer   Chicken pox    GERD (gastroesophageal reflux disease)    History of radiation therapy 06/26/17- 07/24/17   Left Breast 50.05 Gy total   Personal history of radiation therapy    Shingles 07/2002   Sleep apnea    mild uses oral devise   Urinary tract infection    Past Surgical History:  Procedure Laterality Date   BREAST BIOPSY Bilateral 03/07/2017   benign   BREAST BIOPSY Right 03/26/2017   benign   BREAST BIOPSY Left 03/26/2017   malignant   BREAST LUMPECTOMY Left 05/03/2017   BREAST LUMPECTOMY WITH RADIOACTIVE SEED LOCALIZATION Left 05/03/2017   Procedure: LEFT BREAST LUMPECTOMY WITH RADIOACTIVE SEED LOCALIZATION;  Surgeon: Jovita Kussmaul, MD;  Location: [facility];  Service: General;  Laterality: Left;   COLONOSCOPY     CYST EXCISION Right 03/10/2022   Procedure: EXCISION SEBACEOUS CYST RIGHT HIP;  Surgeon: Jovita Kussmaul, MD;  Location: [facility];  Service: General;  Laterality: Right;   HYSTEROSCOPY     Dr Ubaldo Glassing   SPINAL FUSION  12/01/2016   T10 to Pelvis   TOTAL HIP ARTHROPLASTY Left 12/21/2021   Procedure: TOTAL HIP ARTHROPLASTY ANTERIOR APPROACH;  Surgeon: Gaynelle Arabian, MD;  Location: [facility];  Service: Orthopedics;  Laterality: Left;   TOTAL HIP ARTHROPLASTY Right 07/05/2022   Procedure: TOTAL HIP ARTHROPLASTY ANTERIOR APPROACH;  Surgeon: Gaynelle Arabian, MD;  Location: [facility];  Service: Orthopedics;  Laterality: Right;   UPPER GASTROINTESTINAL ENDOSCOPY  11/30/2020   WISDOM TOOTH EXTRACTION     age 13's   Family History  Problem Relation Age of Onset   Diabetes Mother    Arthritis Father    Cancer Maternal Aunt        liver   Heart disease Paternal Grandfather    Prostate cancer Brother    Colon cancer Neg Hx    Pancreatic cancer Neg Hx    Stomach cancer Neg Hx    Social History   Socioeconomic History    Marital status: Married    Spouse name: Not on file   Number of children: 2   Years of education: Not on file   Highest education level: Not on file  Occupational History   Occupation: retired  Tobacco Use   Smoking status: Former    Types: Cigarettes    Quit date: 02/18/1981    Years since quitting: 41.4   Smokeless tobacco: Never  Vaping Use   Vaping Use: Never used  Substance and Sexual Activity   Alcohol use: Yes    Alcohol/week: 2.0 standard drinks of alcohol    Types: 2 Glasses of wine per week    Comment: glass of wine daily   Drug use: No   Sexual  activity: Yes  Other Topics Concern   Not on file  Social History Narrative   ** Merged History Encounter **       Social Determinants of Health   Financial Resource Strain: Low Risk  (08/01/2022)   Overall Financial Resource Strain (CARDIA)    Difficulty of Paying Living Expenses: Not hard at all  Food Insecurity: No Food Insecurity (08/01/2022)   Hunger Vital Sign    Worried About Running Out of Food in the Last Year: Never true    Ran Out of Food in the Last Year: Never true  Transportation Needs: No Transportation Needs (08/01/2022)   PRAPARE - Hydrologist (Medical): No    Lack of Transportation (Non-Medical): No  Physical Activity: Insufficiently Active (08/01/2022)   Exercise Vital Sign    Days of Exercise per Week: 3 days    Minutes of Exercise per Session: 30 min  Stress: No Stress Concern Present (08/01/2022)   [place]    Feeling of Stress : Not at all  Social Connections: [place] (08/01/2022)   Social Connection and Isolation Panel [NHANES]    Frequency of Communication with Friends and Family: More than three times a week    Frequency of Social Gatherings with Friends and Family: More than three times a week    Attends Religious Services: More than 4 times per year    Active Member of [employer] or  Organizations: Yes    Attends Music therapist: More than 4 times per year    Marital Status: Married    Tobacco Counseling Counseling given: Not Answered   Clinical Intake:  Pre-visit preparation completed: Yes  Pain : No/denies pain     Nutritional Risks: None Diabetes: No  How often do you need to have someone help you when you read instructions, pamphlets, or other written materials from your doctor or pharmacy?: 1 - Never  Diabetic?no  Interpreter Needed?: No  Information entered by :: Jadene Pierini , LPN   Activities of Daily Living    08/01/2022    3:54 PM 07/29/2022    9:28 AM  In your present state of health, do you have any difficulty performing the following activities:  Hearing? 0 0  Vision? 0 0  Difficulty concentrating or making decisions? 0 0  Walking or climbing stairs? 0 0  Dressing or bathing? 0 0  Doing errands, shopping? 0 0  Preparing Food and eating ? N N  Using the Toilet? N N  In the past six months, have you accidently leaked urine? N Y  Do you have problems with loss of bowel control? N N  Managing your Medications? N N  Managing your Finances? N N  Housekeeping or managing your Housekeeping? N N    Patient Care Team: Midge Minium, MD as PCP - General (Family Medicine) Paula Compton, MD as Consulting Physician (Obstetrics and Gynecology) Gloris Manchester, MD (Neurosurgery) Regal, Tamala Fothergill, DPM as Consulting Physician (Podiatry) Marica Otter, [place] (Optometry) Jovita Kussmaul, MD as Consulting Physician (General Surgery) Nicholas Lose, MD as Consulting Physician (Hematology and Oncology) Eppie Gibson, MD as Attending Physician (Radiation Oncology) Gardenia Phlegm, NP as Nurse Practitioner (Hematology and Oncology) Irene Shipper, MD as Consulting Physician (Gastroenterology)  Indicate any recent Medical Services you may have received from other than [facility] providers in the past year (date may be  approximate).     Assessment:   This  is a routine wellness examination for Elizabeth [name].  Hearing/Vision screen Vision Screening - Comments:: Annual eye exams wears contacts   Dietary issues and exercise activities discussed: Current Exercise Habits: Home exercise routine (PT recovering from Right Hip replacement), Type of exercise: walking, Time (Minutes): 30, Frequency (Times/Week): 3, Weekly Exercise (Minutes/Week): 90, Intensity: Mild, Exercise limited by: orthopedic condition(s)   Goals Addressed               This Visit's Progress     Maintain (pt-stated)   On track     Maintain current health status by staying active, eating healthy and begin yoga again.        Depression Screen    08/01/2022    3:51 PM 03/06/2022   10:51 AM 11/23/2021    8:37 AM 09/02/2021    8:37 AM 08/12/2021    9:17 AM 07/06/2021   11:50 AM 07/04/2021    9:25 AM  PHQ 2/9 Scores  PHQ - 2 Score 0 0 0 0 0 0 0  PHQ- 9 Score  0 2 0 3 3     Fall Risk    08/01/2022    3:49 PM 07/29/2022    9:28 AM 03/06/2022   10:51 AM 11/23/2021    8:36 AM 09/02/2021    8:37 AM  Fall Risk   Falls in the past year? 0 0 0 0 0  Number falls in past yr: 0   0 0  Injury with Fall? 0   0 0  Risk for fall due to : No Fall Risks  No Fall Risks No Fall Risks No Fall Risks  Follow up Falls prevention discussed  Falls evaluation completed Falls evaluation completed Falls evaluation completed    [place]:  Any stairs in or around the home? No  If so, are there any without handrails? No  Home free of loose throw rugs in walkways, pet beds, electrical cords, etc? Yes  Adequate lighting in your home to reduce risk of falls? Yes   ASSISTIVE DEVICES UTILIZED TO PREVENT FALLS:  Life alert? Yes  Use of a cane, walker or w/c? No  Grab bars in the bathroom? Yes  Shower chair or bench in shower? Yes  Elevated toilet seat or a handicapped toilet? Yes      03/21/2018    9:27 AM  MMSE - Mini Mental  State Exam  Orientation to time 5  Orientation to Place 5  Registration 3  Attention/ Calculation 3  Recall 3  Language- name 2 objects 2  Language- repeat 1  Language- follow 3 step command 3  Language- read & follow direction 1  Write a sentence 1  Copy design 1  Total score 28        08/01/2022    3:54 PM 06/28/2020    9:56 AM  6CIT Screen  What Year? 0 points 0 points  What month? 0 points 0 points  What time? 0 points 0 points  Count back from 20 0 points 0 points  Months in reverse 0 points 0 points  Repeat phrase 0 points 0 points  Total Score 0 points 0 points    Immunizations Immunization History  Administered Date(s) Administered   Fluad Quad(high Dose 65+) 08/06/2019, 08/09/2020, 08/12/2021   Influenza, High Dose Seasonal PF 09/12/2013, 09/24/2015, 08/24/2016, 08/08/2018   Influenza,inj,Quad PF,6+ Mos 08/24/2014, 07/30/2017   Influenza-Unspecified 09/12/2013   PFIZER Comirnaty(Gray Top)Covid-19 Tri-Sucrose Vaccine 04/11/2021   PFIZER(Purple Top)SARS-COV-2 Vaccination 12/04/2019,  12/25/2019   Pfizer Covid-19 Vaccine Bivalent Booster 65yr & up 11/03/2021   Pneumococcal Conjugate-13 07/22/2008, 12/17/2014   Pneumococcal Polysaccharide-23 10/06/2016   Tetanus 10/08/2013   Zoster Recombinat (Shingrix) 11/07/2018, 05/21/2019   Zoster, Live 07/22/2008    TDAP status: Up to date  Flu Vaccine status: Up to date  Pneumococcal vaccine status: Up to date  Covid-19 vaccine status: Completed vaccines  Qualifies for Shingles Vaccine? Yes   Zostavax completed No   Shingrix Completed?: Yes  Screening Tests Health Maintenance  Topic Date Due   COVID-19 Vaccine (5 - Pfizer risk series) 12/29/2021   INFLUENZA VACCINE  06/20/2022   TETANUS/TDAP  10/09/2023   Pneumonia Vaccine 81 Years old  Completed   DEXA SCAN  Completed   Hepatitis C Screening  Completed   Zoster Vaccines- Shingrix  Completed   HPV VACCINES  Aged Out    Health Maintenance  Health  Maintenance Due  Topic Date Due   COVID-19 Vaccine (5 - Pfizer risk series) 12/29/2021   INFLUENZA VACCINE  06/20/2022    Colorectal cancer screening: No longer required.   Mammogram status: No longer required due to age.  Bone Density status: Completed 05/31/2021. Results reflect: Bone density results: OSTEOPENIA. Repeat every 5 years.  Lung Cancer Screening: (Low Dose CT Chest recommended if Age 339-80years, 30 pack-year currently smoking OR have quit w/in 15years.) does not qualify.   Lung Cancer Screening Referral: n/a  Additional Screening:  Hepatitis C Screening: does not qualify;   Vision Screening: Recommended annual ophthalmology exams for early detection of glaucoma and other disorders of the eye. Is the patient up to date with their annual eye exam?  Yes  Who is the provider or what is the name of the office in which the patient attends annual eye exams? Dr.Miller  If pt is not established with a provider, would they like to be referred to a provider to establish care? No .   Dental Screening: Recommended annual dental exams for proper oral hygiene  Community Resource Referral / Chronic Care Management: CRR required this visit?  No   CCM required this visit?  No      Plan:     I have personally reviewed and noted the following in the patient's chart:   Medical and social history Use of alcohol, tobacco or illicit drugs  Current medications and supplements including opioid prescriptions. Patient is not currently taking opioid prescriptions. Functional ability and status Nutritional status Physical activity Advanced directives List of other physicians Hospitalizations, surgeries, and ER visits in previous 12 months Vitals Screenings to include cognitive, depression, and falls Referrals and appointments  In addition, I have reviewed and discussed with patient certain preventive protocols, quality metrics, and best practice recommendations. A written  personalized care plan for preventive services as well as general preventive health recommendations were provided to patient.     LDaphane Shepherd LPN   [id number]  Nurse Notes: none

## 2022-08-01 NOTE — Telephone Encounter (Signed)
Pt is requesting advisement on the Gabapentin and how it can be used in addition to Hydrocodone for post surgical pain notes she is also seeing surgeon for follow up

## 2022-08-02 DIAGNOSIS — M1611 Unilateral primary osteoarthritis, right hip: Secondary | ICD-10-CM | POA: Diagnosis not present

## 2022-08-02 DIAGNOSIS — M545 Low back pain, unspecified: Secondary | ICD-10-CM | POA: Diagnosis not present

## 2022-08-02 DIAGNOSIS — Z471 Aftercare following joint replacement surgery: Secondary | ICD-10-CM | POA: Diagnosis not present

## 2022-08-02 DIAGNOSIS — M5459 Other low back pain: Secondary | ICD-10-CM | POA: Diagnosis not present

## 2022-08-02 DIAGNOSIS — R2689 Other abnormalities of gait and mobility: Secondary | ICD-10-CM | POA: Diagnosis not present

## 2022-08-02 DIAGNOSIS — R278 Other lack of coordination: Secondary | ICD-10-CM | POA: Diagnosis not present

## 2022-08-02 DIAGNOSIS — M62551 Muscle wasting and atrophy, not elsewhere classified, right thigh: Secondary | ICD-10-CM | POA: Diagnosis not present

## 2022-08-02 DIAGNOSIS — M25551 Pain in right hip: Secondary | ICD-10-CM | POA: Diagnosis not present

## 2022-08-02 MED ORDER — GABAPENTIN 600 MG PO TABS: 600.0000 mg | ORAL_TABLET | Freq: Three times a day (TID) | ORAL | 3 refills | Status: DC

## 2022-08-02 NOTE — Telephone Encounter (Signed)
[facility] VISIT   Patient agreed to [facility] visit and is aware that copayment and coinsurance may apply. Patient was treated using telemedicine according to accepted telemedicine protocols.  Subjective:   Patient complains of pain s/p hip surgery and inability to stop pain meds  Patient Active Problem List   Diagnosis Date Noted   Primary osteoarthritis of right hip 07/05/2022   Blood loss anemia 01/06/2022   OA (osteoarthritis) of hip 12/21/2021   S/P total left hip arthroplasty 12/21/2021   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   Hx of breast cancer 08/12/2021   Pain of left hip joint 01/24/2021   Dysphagia 11/02/2020   Diarrhea 11/02/2020   Sensorineural hearing loss (SNHL) of both ears 06/24/2019   S/P lumbar spinal fusion 12/01/2016   Lumbar back pain 12/23/2015   Routine general medical examination at a health care facility 10/08/2013   Chronic back pain 12/26/2012   Blood glucose elevated 12/26/2012   Social History   Tobacco Use   Smoking status: Former    Types: Cigarettes    Quit date: 02/18/1981    Years since quitting: 41.4   Smokeless tobacco: Never  Substance Use Topics   Alcohol use: Yes    Alcohol/week: 2.0 standard drinks of alcohol    Types: 2 Glasses of wine per week    Comment: glass of wine daily    Current Outpatient Medications:    acetaminophen (TYLENOL) 325 MG tablet, Take 650 mg by mouth every 6 (six) hours as needed., Disp: , Rfl:    aspirin EC 81 MG tablet, Take 81 mg by mouth daily. Swallow whole., Disp: , Rfl:    docusate sodium (COLACE) 100 MG capsule, Take 100 mg by mouth 2 (two) times daily., Disp: , Rfl:    estradiol (ESTRACE) 0.1 MG/GM vaginal cream, Place vaginally 3 (three) times a week., Disp: , Rfl:    gabapentin (NEURONTIN) 300 MG capsule, TAKE 1 CAPSULE BY MOUTH FOUR TIMES A DAY, Disp: 360 capsule, Rfl: 0   guaiFENesin (MUCINEX) 600 MG 12 hr tablet, Take 600 mg by mouth every 12 (twelve) hours., Disp: , Rfl:     HYDROcodone-acetaminophen (NORCO/VICODIN) 5-325 MG tablet, Take 2 tablets by mouth 4 (four) times daily. 2 tablets four times a day for 5 days, then 1 tablet four times a day for 5 days then 1 tablet four times a day as needed for 5 days then discontinue., Disp: 60 tablet, Rfl: 0   loratadine (CLARITIN) 10 MG tablet, Take 10 mg by mouth daily., Disp: , Rfl:    METAMUCIL FIBER PO, Take by mouth See admin instructions. 3 caps in the morning, 4 caps in  the evening, Disp: , Rfl:    methocarbamol (ROBAXIN) 500 MG tablet, Take 1-2 tablets (500-1,000 mg total) by mouth 4 (four) times daily as needed for muscle spasms., Disp: 60 tablet, Rfl: 0   montelukast (SINGULAIR) 10 MG tablet, TAKE 1 TABLET BY MOUTH EVERYDAY AT BEDTIME, Disp: 90 tablet, Rfl: 1   omeprazole (PRILOSEC) 20 MG capsule, Take 1 capsule (20 mg total) by mouth daily. Office visit for further refills, Disp: 90 capsule, Rfl: 0   polyethylene glycol (MIRALAX / GLYCOLAX) 17 g packet, Take 17 g by mouth as needed., Disp: , Rfl:    Probiotic Product (ADVANCED PROBIOTIC 10) CAPS, Take 1 capsule by mouth daily., Disp: , Rfl:   No Known Allergies  Assessment and Plan:   Diagnosis: chronic post procedural pain. Please see myChart communication and  orders below.   No orders of the defined types were placed in this encounter.  No orders of the defined types were placed in this encounter.   Annye Asa, MD 08/02/2022  A total of 7 minutes were spent by me to personally review the patient-generated inquiry, review patient records and data pertinent to assessment of the patient's problem, develop a management plan including generation of prescriptions and/or orders, and on subsequent communication with the patient through secure the MyChart portal service.   There is no separately reported E/M service related to this service in the past 7 days nor does the patient have an upcoming soonest available appointment for this issue. This work was  completed in less than 7 days.   The patient consented to this service today (see patient agreement prior to ongoing communication). Patient counseled regarding the need for in-person exam for certain conditions and was advised to call the office if any changing or worsening symptoms occur.   The codes to be used for the E/M service are: '[x]'$   99421 for 5-10 minutes of time spent on the inquiry. '[]'$   99422 for 11-20 minutes. '[]'$   99423 for 21+ minutes.

## 2022-08-15 DIAGNOSIS — H5213 Myopia, bilateral: Secondary | ICD-10-CM | POA: Diagnosis not present

## 2022-08-16 ENCOUNTER — Encounter: Payer: Self-pay | Admitting: Family Medicine

## 2022-08-16 ENCOUNTER — Ambulatory Visit (INDEPENDENT_AMBULATORY_CARE_PROVIDER_SITE_OTHER): Payer: Medicare HMO | Admitting: Family Medicine

## 2022-08-16 VITALS — BP 112/62 | HR 76 | Temp 97.2°F | Resp 16 | Ht 65.0 in | Wt 138.1 lb

## 2022-08-16 DIAGNOSIS — E785 Hyperlipidemia, unspecified: Secondary | ICD-10-CM

## 2022-08-16 DIAGNOSIS — Z0000 Encounter for general adult medical examination without abnormal findings: Secondary | ICD-10-CM

## 2022-08-16 DIAGNOSIS — Z23 Encounter for immunization: Secondary | ICD-10-CM | POA: Diagnosis not present

## 2022-08-16 LAB — LIPID PANEL
Cholesterol: 190 mg/dL (ref 0–200)
HDL: 68.6 mg/dL (ref >39.00)
LDL Cholesterol: 98 mg/dL (ref 0–99)
NonHDL: 121.45
Total CHOL/HDL Ratio: 3
Triglycerides: 119 mg/dL (ref 0.0–149.0)
VLDL: 23.8 mg/dL (ref 0.0–40.0)

## 2022-08-16 LAB — CBC WITH DIFFERENTIAL/PLATELET
Basophils Absolute: 0 10*3/uL (ref 0.0–0.1)
Basophils Relative: 1 % (ref 0.0–3.0)
Eosinophils Absolute: 0.1 10*3/uL (ref 0.0–0.7)
Eosinophils Relative: 1.8 % (ref 0.0–5.0)
HCT: 35 % — ABNORMAL LOW (ref 36.0–46.0)
Hemoglobin: 11.7 g/dL — ABNORMAL LOW (ref 12.0–15.0)
Lymphocytes Relative: 18.4 % (ref 12.0–46.0)
Lymphs Abs: 0.8 10*3/uL (ref 0.7–4.0)
MCHC: 33.4 g/dL (ref 30.0–36.0)
MCV: 95.8 fl (ref 78.0–100.0)
Monocytes Absolute: 0.5 10*3/uL (ref 0.1–1.0)
Monocytes Relative: 10.3 % (ref 3.0–12.0)
Neutro Abs: 3 10*3/uL (ref 1.4–7.7)
Neutrophils Relative %: 68.5 % (ref 43.0–77.0)
Platelets: 329 10*3/uL (ref 150.0–400.0)
RBC: 3.65 Mil/uL — ABNORMAL LOW (ref 3.87–5.11)
RDW: 12.7 % (ref 11.5–15.5)
WBC: 4.4 10*3/uL (ref 4.0–10.5)

## 2022-08-16 LAB — HEPATIC FUNCTION PANEL
ALT: 13 U/L (ref 0–35)
AST: 14 U/L (ref 0–37)
Albumin: 4.2 g/dL (ref 3.5–5.2)
Alkaline Phosphatase: 87 U/L (ref 39–117)
Bilirubin, Direct: 0.1 mg/dL (ref 0.0–0.3)
Total Bilirubin: 0.4 mg/dL (ref 0.2–1.2)
Total Protein: 7.7 g/dL (ref 6.0–8.3)

## 2022-08-16 LAB — BASIC METABOLIC PANEL
BUN: 12 mg/dL (ref 6–23)
CO2: 28 mEq/L (ref 19–32)
Calcium: 9.8 mg/dL (ref 8.4–10.5)
Chloride: 98 mEq/L (ref 96–112)
Creatinine, Ser: 0.75 mg/dL (ref 0.40–1.20)
GFR: 75.59 mL/min (ref >60.00)
Glucose, Bld: 112 mg/dL — ABNORMAL HIGH (ref 70–99)
Potassium: 4.3 mEq/L (ref 3.5–5.1)
Sodium: 132 mEq/L — ABNORMAL LOW (ref 135–145)

## 2022-08-16 LAB — TSH: TSH: 2.18 u[IU]/mL (ref 0.35–5.50)

## 2022-08-16 NOTE — Assessment & Plan Note (Signed)
Pt's PE WNL.  UTD on Tdap, PNA, mammo, colonoscopy, DEXA.  Flu shot given today.  Check labs.  Anticipatory guidance provided.

## 2022-08-16 NOTE — Progress Notes (Signed)
Informed pt of lab results  

## 2022-08-16 NOTE — Progress Notes (Signed)
   Subjective:    Patient ID: Elizabeth [name], female    DOB: 1941/09/25, 81 y.o.   MRN: [id number]  HPI CPE- UTD on Tdap, PNA, mammo, colonoscopy, DEXA  Patient Care Team    Relationship Specialty Notifications Start End  Midge Minium, MD PCP - General Family Medicine  12/26/12   Paula Compton, MD Consulting Physician Obstetrics and Gynecology  03/01/15   Gloris Manchester, MD  Neurosurgery  03/15/17   Wallene Huh, DPM Consulting Physician Podiatry  03/15/17   Marica Otter, OD  Optometry  03/15/17   Jovita Kussmaul, MD Consulting Physician General Surgery  04/03/17   Nicholas Lose, MD Consulting Physician Hematology and Oncology  04/03/17   Eppie Gibson, MD Attending Physician Radiation Oncology  04/03/17   Gardenia Phlegm, NP Nurse Practitioner Hematology and Oncology  08/15/17   Irene Shipper, MD Consulting Physician Gastroenterology  08/09/20      Health Maintenance  Topic Date Due   INFLUENZA VACCINE  06/20/2022   TETANUS/TDAP  10/09/2023   Pneumonia Vaccine 29+ Years old  Completed   DEXA SCAN  Completed   Hepatitis C Screening  Completed   Zoster Vaccines- Shingrix  Completed   HPV VACCINES  Aged Out   COVID-19 Vaccine  Discontinued     Review of Systems Patient reports no vision/ hearing changes, adenopathy,fever, weight change,  persistant/recurrent hoarseness, swallowing issues, chest pain, palpitations, edema, persistant/recurrent cough, hemoptysis, dyspnea (rest/exertional/paroxysmal nocturnal), gastrointestinal bleeding (melena, rectal bleeding), abdominal pain, significant heartburn, bowel changes, GU symptoms (dysuria, hematuria, incontinence), Gyn symptoms (abnormal  bleeding, pain),  syncope, focal weakness, memory loss, numbness & tingling, skin/hair/nail changes, abnormal bruising or bleeding, anxiety, or depression.     Objective:   Physical Exam General Appearance:    Alert, cooperative, no distress, appears stated age  Head:     Normocephalic, without obvious abnormality, atraumatic  Eyes:    PERRL, conjunctiva/corneas clear, EOM's intact both eyes  Ears:    Normal TM's and external ear canals, both ears  Nose:   Nares normal, septum midline, mucosa normal, no drainage    or sinus tenderness  Throat:   Lips, mucosa, and tongue normal; teeth and gums normal  Neck:   Supple, symmetrical, trachea midline, no adenopathy;    Thyroid: no enlargement/tenderness/nodules  Back:     Symmetric, no curvature, ROM normal, no CVA tenderness  Lungs:     Clear to auscultation bilaterally, respirations unlabored  Chest Wall:    No tenderness or deformity   Heart:    Regular rate and rhythm, S1 and S2 normal, no murmur, rub   or gallop  Breast Exam:    Deferred to mammo  Abdomen:     Soft, non-tender, bowel sounds active all four quadrants,    no masses, no organomegaly  Genitalia:    Deferred to GYN  Rectal:    Extremities:   Extremities normal, atraumatic, no cyanosis or edema  Pulses:   2+ and symmetric all extremities  Skin:   Skin color, texture, turgor normal, no rashes or lesions  Lymph nodes:   Cervical, supraclavicular, and axillary nodes normal  Neurologic:   CNII-XII intact, normal strength, sensation and reflexes    throughout          Assessment & Plan:

## 2022-08-16 NOTE — Patient Instructions (Addendum)
Follow up in 1 year or as needed We'll notify you of your lab results and make any changes if needed Keep up the good work on healthy diet and regular physical activity- you look great! If the pain persists past when Ortho releases you, let me know! Call with any questions or concerns Stay Safe!  Stay Healthy! Happy Fall!!

## 2022-09-13 DIAGNOSIS — R079 Chest pain, unspecified: Secondary | ICD-10-CM | POA: Diagnosis not present

## 2022-09-19 ENCOUNTER — Other Ambulatory Visit (HOSPITAL_BASED_OUTPATIENT_CLINIC_OR_DEPARTMENT_OTHER): Payer: Self-pay

## 2022-09-19 MED ORDER — COMIRNATY 30 MCG/0.3ML IM SUSY
PREFILLED_SYRINGE | INTRAMUSCULAR | 0 refills | Status: DC
  Filled 2022-09-19: qty 0.3, 1d supply, fill #0

## 2022-11-08 DIAGNOSIS — N952 Postmenopausal atrophic vaginitis: Secondary | ICD-10-CM | POA: Diagnosis not present

## 2022-11-08 DIAGNOSIS — N3000 Acute cystitis without hematuria: Secondary | ICD-10-CM | POA: Diagnosis not present

## 2022-11-23 DIAGNOSIS — D0512 Intraductal carcinoma in situ of left breast: Secondary | ICD-10-CM | POA: Diagnosis not present

## 2022-12-05 ENCOUNTER — Ambulatory Visit: Payer: Medicare HMO | Admitting: Internal Medicine

## 2022-12-07 ENCOUNTER — Encounter: Payer: Self-pay | Admitting: Internal Medicine

## 2022-12-07 ENCOUNTER — Ambulatory Visit: Payer: Medicare HMO | Admitting: Internal Medicine

## 2022-12-07 VITALS — BP 118/72 | HR 81 | Ht 66.0 in | Wt 141.0 lb

## 2022-12-07 DIAGNOSIS — R194 Change in bowel habit: Secondary | ICD-10-CM | POA: Diagnosis not present

## 2022-12-07 DIAGNOSIS — R1310 Dysphagia, unspecified: Secondary | ICD-10-CM | POA: Diagnosis not present

## 2022-12-07 DIAGNOSIS — K219 Gastro-esophageal reflux disease without esophagitis: Secondary | ICD-10-CM | POA: Diagnosis not present

## 2022-12-07 MED ORDER — OMEPRAZOLE 20 MG PO CPDR: 20.0000 mg | DELAYED_RELEASE_CAPSULE | Freq: Every day | ORAL | 3 refills | Status: DC

## 2022-12-07 NOTE — Progress Notes (Signed)
HISTORY OF PRESENT ILLNESS:  Elizabeth [name] is a 82 y.o. female with past medical history as listed below who is followed in this office for GERD complicated by peptic stricture requiring esophageal dilation and screening colonoscopy.  Patient was last seen in this office January 12, 2021.  At that time she was asymptomatic post dilation on PPI.  She did mention irregular bowel movements.  See that dictation.  She tells me that she is doing well on her PPI.  No reflux symptoms.  Her swallowing improved post dilation, though she does have occasions where she feels like she is experiencing some mild form of dysphagia.  She request medication refill.  She also has questions regarding possible medication side effects and brings with her a newspaper clipping from the [place] talking about possible kidney related problems from PPI.  She also mentions that her bowel habits remain regular with loose stools alternating with hard stools.  No bleeding, weight loss, or other problems.  Last colonoscopy 2015 with diverticulosis only. Last upper endoscopy with esophageal dilation January 2022. Review of outside blood work from August 16, 2022 reveals unremarkable comprehensive metabolic panel.  Normal renal function, specifically.  REVIEW OF SYSTEMS:  All non-GI ROS negative otherwise stated in the HPI except for cough, back pain, arthritis, sinus and allergy trouble  Past Medical History:  Diagnosis Date   Arthritis    Cancer ([facility])    left breast cancer   Chicken pox    GERD (gastroesophageal reflux disease)    History of radiation therapy 06/26/17- 07/24/17   Left Breast 50.05 Gy total   Personal history of radiation therapy    Shingles 07/2002   Sleep apnea    mild uses oral devise   Urinary tract infection     Past Surgical History:  Procedure Laterality Date   BREAST BIOPSY Bilateral 03/07/2017   benign   BREAST BIOPSY Right 03/26/2017   benign   BREAST BIOPSY Left  03/26/2017   malignant   BREAST LUMPECTOMY Left 05/03/2017   BREAST LUMPECTOMY WITH RADIOACTIVE SEED LOCALIZATION Left 05/03/2017   Procedure: LEFT BREAST LUMPECTOMY WITH RADIOACTIVE SEED LOCALIZATION;  Surgeon: Jovita Kussmaul, MD;  Location: [facility];  Service: General;  Laterality: Left;   COLONOSCOPY     CYST EXCISION Right 03/10/2022   Procedure: EXCISION SEBACEOUS CYST RIGHT HIP;  Surgeon: Jovita Kussmaul, MD;  Location: [facility];  Service: General;  Laterality: Right;   HYSTEROSCOPY     Dr Ubaldo Glassing   SPINAL FUSION  12/01/2016   T10 to Pelvis   TOTAL HIP ARTHROPLASTY Left 12/21/2021   Procedure: TOTAL HIP ARTHROPLASTY ANTERIOR APPROACH;  Surgeon: Gaynelle Arabian, MD;  Location: [facility];  Service: Orthopedics;  Laterality: Left;   TOTAL HIP ARTHROPLASTY Right 07/05/2022   Procedure: TOTAL HIP ARTHROPLASTY ANTERIOR APPROACH;  Surgeon: Gaynelle Arabian, MD;  Location: [facility];  Service: Orthopedics;  Laterality: Right;   UPPER GASTROINTESTINAL ENDOSCOPY  11/30/2020   WISDOM TOOTH EXTRACTION     age 60's    Social History Elizabeth [name]  reports that she quit smoking about 41 years ago. Her smoking use included cigarettes. She has never used smokeless tobacco. She reports current alcohol use of about 2.0 standard drinks of alcohol per week. She reports that she does not use drugs.  family history includes Arthritis in her father; Cancer in her maternal aunt; Diabetes in her mother; Heart disease in her paternal grandfather; Prostate cancer in her brother.  No Known Allergies     PHYSICAL EXAMINATION: Vital signs: BP 118/72   Pulse 81   Ht '5\' 6"'$  (1.676 m)   Wt 141 lb (64 kg)   BMI 22.76 kg/m   Constitutional: generally well-appearing, no acute distress Psychiatric: alert and oriented x 3, cooperative Eyes: extraocular movements intact, anicteric, conjunctiva pink Mouth: oral pharynx moist, no lesions Neck: supple no lymphadenopathy Cardiovascular: heart regular  rate and rhythm, no murmur Lungs: clear to auscultation bilaterally Abdomen: soft, nontender, nondistended, no obvious ascites, no peritoneal signs, normal bowel sounds, no organomegaly Rectal: Omitted Extremities: no clubbing, cyanosis, or lower extremity edema bilaterally Skin: no lesions on visible extremities Neuro: No focal deficits.  Cranial nerves intact  ASSESSMENT:  1.  GERD complicated by peptic stricture.  No active reflux symptoms PPI.  Trivial dysphagia present. 2.  Diverticulosis on colonoscopy.  Aged out of screening   PLAN:  1.  Reflux precautions 2.  Refill omeprazole 20 mg daily.  Medication risks reviewed in detail.  All concerns addressed. 3.  Recommend Citrucel 1 to 2 tablespoons daily for alternating bowel habits 4.  Recommend routine office follow-up in 2 years.  Contact the office in the interim for any questions or problems such as worsening dysphagia.  She agrees A total time of 30 minutes was spent preparing to see the patient, reviewing data, obtaining interval history, performing medically appropriate physical examination, counseling and educating the patient regarding above listed issues, answering questions, prescribing medication, providing follow-up parameters and intervals, and documenting clinical information in the health record,

## 2022-12-07 NOTE — Patient Instructions (Signed)
_______________________________________________________  If your blood pressure at your visit was 140/90 or greater, please contact your primary care physician to follow up on this.  _______________________________________________________  If you are age 80 or older, your body mass index should be between 23-30. Your Body mass index is 22.76 kg/m. If this is out of the aforementioned range listed, please consider follow up with your Primary Care Provider.  If you are age 68 or younger, your body mass index should be between 19-25. Your Body mass index is 22.76 kg/m. If this is out of the aformentioned range listed, please consider follow up with your Primary Care Provider.   ________________________________________________________  The [facility] providers would like to encourage you to use [facility] to communicate with providers for non-urgent requests or questions.  Due to long hold times on the telephone, sending your provider a message by [facility] may be a faster and more efficient way to get a response.  Please allow 48 business hours for a response.  Please remember that this is for non-urgent requests.  _______________________________________________________  We have sent the following medications to your pharmacy for you to pick up at your convenience:  Omeprazole  Please follow up in 2 years

## 2022-12-14 ENCOUNTER — Telehealth: Payer: Self-pay | Admitting: *Deleted

## 2022-12-14 ENCOUNTER — Encounter: Payer: Self-pay | Admitting: *Deleted

## 2022-12-14 NOTE — Patient Outreach (Signed)
  Care Coordination   Initial Visit Note   12/14/2022 Name: Ayven Glasco MRN: [id number] DOB: 10/21/1941  Cyan Clippinger Garnett Rekowski is a 82 y.o. year old female who sees Tabori, Aundra Millet, MD for primary care. I spoke with  Guadelupe Sabin Kane by phone today.  What matters to the patients health and wellness today?  No needs    Goals Addressed             This Visit's Progress    COMPLETED: Care coordination activity       Care Coordination Interventions: Provided education to patient and/or caregiver about advanced directives Reviewed medications with patient and discussed adherence with no needed refills Reviewed scheduled/upcoming provider appointments including sufficient transportation Screening for signs and symptoms of depression related to chronic disease state  Assessed social determinant of health barriers Educated on care management services with no needs presented today.         SDOH assessments and interventions completed:  Yes  SDOH Interventions Today    Flowsheet Row Most Recent Value  SDOH Interventions   Food Insecurity Interventions Intervention Not Indicated  Housing Interventions Intervention Not Indicated  Transportation Interventions Intervention Not Indicated  Utilities Interventions Intervention Not Indicated        Care Coordination Interventions:  Yes, provided   Follow up plan: No further intervention required.   Encounter Outcome:  Pt. Visit Completed   Raina Mina, RN Care Management Coordinator [place] Office [phone]

## 2022-12-14 NOTE — Patient Instructions (Signed)
Visit Information  Thank you for taking time to visit with me today. Please don't hesitate to contact me if I can be of assistance to you.   Following are the goals we discussed today:   Goals Addressed             This Visit's Progress    COMPLETED: Care coordination activity       Care Coordination Interventions: Provided education to patient and/or caregiver about advanced directives Reviewed medications with patient and discussed adherence with no needed refills Reviewed scheduled/upcoming provider appointments including sufficient transportation Screening for signs and symptoms of depression related to chronic disease state  Assessed social determinant of health barriers Educated on care management services with no needs presented today.         Please call the care guide team at [phone] if you need to cancel or reschedule your appointment.   If you are experiencing a Mental Health or [place] or need someone to talk to, please call the Suicide and Crisis Lifeline: 988  Patient verbalizes understanding of instructions and care plan provided today and agrees to view in [place]. Active MyChart status and patient understanding of how to access instructions and care plan via MyChart confirmed with patient.     No further follow up required: No needs  Raina Mina, RN Care Management Coordinator [place] Office [phone]

## 2022-12-19 ENCOUNTER — Other Ambulatory Visit: Payer: Self-pay | Admitting: Family Medicine

## 2022-12-20 NOTE — Telephone Encounter (Signed)
Gabapentin 600 mg LOV: 08/16/22 Last Refill:08/02/22 Upcoming appt: 08/17/23

## 2022-12-20 NOTE — Telephone Encounter (Signed)
Informed pt that her Rx has been sent in

## 2023-01-23 DIAGNOSIS — Z01419 Encounter for gynecological examination (general) (routine) without abnormal findings: Secondary | ICD-10-CM | POA: Diagnosis not present

## 2023-01-31 ENCOUNTER — Other Ambulatory Visit: Payer: Self-pay | Admitting: Family Medicine

## 2023-01-31 DIAGNOSIS — Z1231 Encounter for screening mammogram for malignant neoplasm of breast: Secondary | ICD-10-CM

## 2023-02-11 ENCOUNTER — Other Ambulatory Visit: Payer: Self-pay | Admitting: Family Medicine

## 2023-02-11 DIAGNOSIS — M25552 Pain in left hip: Secondary | ICD-10-CM

## 2023-02-21 ENCOUNTER — Ambulatory Visit (INDEPENDENT_AMBULATORY_CARE_PROVIDER_SITE_OTHER): Payer: Medicare HMO | Admitting: Family Medicine

## 2023-02-21 ENCOUNTER — Encounter: Payer: Self-pay | Admitting: Family Medicine

## 2023-02-21 VITALS — BP 106/70 | HR 83 | Temp 97.8°F | Resp 17 | Ht 66.0 in | Wt 139.2 lb

## 2023-02-21 DIAGNOSIS — M545 Low back pain, unspecified: Secondary | ICD-10-CM | POA: Diagnosis not present

## 2023-02-21 DIAGNOSIS — M25552 Pain in left hip: Secondary | ICD-10-CM | POA: Diagnosis not present

## 2023-02-21 MED ORDER — METHOCARBAMOL 500 MG PO TABS: 500.0000 mg | ORAL_TABLET | Freq: Four times a day (QID) | ORAL | 3 refills | Status: AC | PRN

## 2023-02-21 MED ORDER — MELOXICAM 15 MG PO TABS: 15.0000 mg | ORAL_TABLET | Freq: Every day | ORAL | 0 refills | Status: DC

## 2023-02-21 NOTE — Patient Instructions (Signed)
Follow up as needed or as scheduled START the Meloxicam daily- take w/ food- a few days before you fly and then for 1-2 days after you return CONTINUE the Gabapentin and Methocarbamol I would take the hydrocodone just to have on hand in case of pain flare Call with any questions or concerns Stay Safe!  Stay Healthy! Have an AMAZING time!!!

## 2023-02-21 NOTE — Progress Notes (Signed)
   Subjective:    Patient ID: Elizabeth [name], female    DOB: 04-03-1941, 82 y.o.   MRN: [id number]  HPI Lumbar back pain- chronic problem, R sided.  Pt has 2 week river cruise upcoming- leaving 5/6.  Has not used Hydrocodone in 7 months.  Pt has been to [facility] and has alternative treatments to use.  She is very anxious about leaving the country and developing a pain flair and having nothing to combat her pain.  Pt gets dry needling every 3 weeks.  Pt is asking about possibly using Meloxicam or a 'steroid pack'.  Currently on Gabapentin 600mg  TID, Methocarbamol 500-1000mg  PRN.     Review of Systems For ROS see HPI     Objective:   Physical Exam Vitals reviewed.  Constitutional:      General: She is not in acute distress.    Appearance: Normal appearance. She is not ill-appearing.  HENT:     Head: Normocephalic and atraumatic.  Skin:    General: Skin is warm and dry.  Neurological:     General: No focal deficit present.     Mental Status: She is alert and oriented to person, place, and time.  Psychiatric:        Mood and Affect: Mood normal.        Behavior: Behavior normal.        Thought Content: Thought content normal.           Assessment & Plan:

## 2023-02-28 NOTE — Assessment & Plan Note (Signed)
Chronic problem for pt.  She has been to [facility] regarding this issue and participates in alternative treatments.  She gets dry needling every 3 weeks and finds this to be helpful.  I told her, if possible to schedule this the week prior to leaving for her trip.  She is worried about the vibration of the flight causing pain/inflammation.  We agreed upon taking Meloxicam once daily starting before she leaves and continuing through her trip.  She will take her Methocarbamol as needed and save pain pills for severe pain.  Pt expressed understanding and is in agreement w/ plan.

## 2023-03-12 ENCOUNTER — Ambulatory Visit: Payer: Medicare HMO | Admitting: Family Medicine

## 2023-03-12 ENCOUNTER — Encounter: Payer: Self-pay | Admitting: Family Medicine

## 2023-03-12 ENCOUNTER — Ambulatory Visit (INDEPENDENT_AMBULATORY_CARE_PROVIDER_SITE_OTHER): Payer: Medicare HMO | Admitting: Family Medicine

## 2023-03-12 VITALS — BP 110/68 | HR 67 | Temp 97.7°F | Resp 16 | Ht 66.0 in | Wt 142.1 lb

## 2023-03-12 DIAGNOSIS — R300 Dysuria: Secondary | ICD-10-CM

## 2023-03-12 LAB — POCT URINALYSIS DIPSTICK
Bilirubin, UA: NEGATIVE
Blood, UA: POSITIVE
Glucose, UA: NEGATIVE
Protein, UA: NEGATIVE
Spec Grav, UA: 1.02 (ref 1.010–1.025)
Urobilinogen, UA: 0.2 E.U./dL
pH, UA: 7 (ref 5.0–8.0)

## 2023-03-12 MED ORDER — CEPHALEXIN 500 MG PO CAPS: 500.0000 mg | ORAL_CAPSULE | Freq: Two times a day (BID) | ORAL | 0 refills | Status: AC

## 2023-03-12 NOTE — Patient Instructions (Signed)
Follow up as needed or as scheduled Start the Cephalexin twice daily- take w/ food Drink LOTS of fluids Call with any questions or concerns Hang in there!!!

## 2023-03-12 NOTE — Progress Notes (Signed)
   Subjective:    Patient ID: Elizabeth [name], female    DOB: 11/28/1940, 82 y.o.   MRN: [id number]  HPI Dysuria- pt sees Dr Hillis Range (Urology) but has been doing well.  This morning developed pressure in her bladder.  Then developed burning w/ urination about 10:00.  Last void had visible blood.  No fevers, chills.     Review of Systems For ROS see HPI     Objective:   Physical Exam Vitals reviewed.  Constitutional:      General: She is not in acute distress.    Appearance: Normal appearance. She is not ill-appearing.  HENT:     Head: Normocephalic and atraumatic.  Abdominal:     General: There is no distension.     Palpations: Abdomen is soft.     Tenderness: There is no abdominal tenderness. There is no right CVA tenderness, left CVA tenderness or guarding.  Skin:    General: Skin is warm and dry.  Neurological:     General: No focal deficit present.     Mental Status: She is alert and oriented to person, place, and time.  Psychiatric:        Mood and Affect: Mood normal.        Behavior: Behavior normal.        Thought Content: Thought content normal.           Assessment & Plan:   Dysuria- pt's sxs and UA are consistent w/ infxn.  Start Keflex  BID x5 days.  Reviewed supportive care and red flags that should prompt return.  Pt expressed understanding and is in agreement w/ plan.

## 2023-03-13 LAB — URINE CULTURE
MICRO NUMBER:: 14856131
SPECIMEN QUALITY:: ADEQUATE

## 2023-03-14 ENCOUNTER — Encounter: Payer: Self-pay | Admitting: Family Medicine

## 2023-03-14 ENCOUNTER — Other Ambulatory Visit: Payer: Self-pay | Admitting: Family Medicine

## 2023-03-14 ENCOUNTER — Telehealth: Payer: Self-pay

## 2023-03-14 DIAGNOSIS — N3946 Mixed incontinence: Secondary | ICD-10-CM | POA: Diagnosis not present

## 2023-03-14 DIAGNOSIS — R350 Frequency of micturition: Secondary | ICD-10-CM | POA: Diagnosis not present

## 2023-03-14 DIAGNOSIS — R300 Dysuria: Secondary | ICD-10-CM | POA: Diagnosis not present

## 2023-03-14 DIAGNOSIS — M25552 Pain in left hip: Secondary | ICD-10-CM

## 2023-03-14 DIAGNOSIS — R109 Unspecified abdominal pain: Secondary | ICD-10-CM | POA: Diagnosis not present

## 2023-03-14 DIAGNOSIS — K5730 Diverticulosis of large intestine without perforation or abscess without bleeding: Secondary | ICD-10-CM | POA: Diagnosis not present

## 2023-03-14 NOTE — Telephone Encounter (Signed)
-----   Message from Sheliah Hatch, MD sent at 03/14/2023  7:27 AM EDT ----- No obvious bacteria growth on culture- how are you feeling?

## 2023-03-14 NOTE — Telephone Encounter (Signed)
Pt seen results Via my chart  

## 2023-03-15 MED ORDER — GABAPENTIN 600 MG PO TABS: 600.0000 mg | ORAL_TABLET | Freq: Three times a day (TID) | ORAL | 3 refills | Status: DC

## 2023-03-15 NOTE — Telephone Encounter (Signed)
Gabapentin 600 mg is what she is needing refilled . That was my mistake

## 2023-03-15 NOTE — Telephone Encounter (Signed)
Does she want the  4x/day or the  3x/day?  Her current dose is  3x/day but that is not what the refill is requesting.  I just want to make sure I send the right thing

## 2023-03-15 NOTE — Telephone Encounter (Signed)
Gabapentin  LOV4/22/24:  Last Refill:12/20/22 Upcoming appt: 08/17/23

## 2023-03-20 ENCOUNTER — Other Ambulatory Visit: Payer: Self-pay | Admitting: Family Medicine

## 2023-03-20 ENCOUNTER — Ambulatory Visit
Admission: RE | Admit: 2023-03-20 | Discharge: 2023-03-20 | Disposition: A | Discharge status: Home / Self Care | Payer: Medicare HMO | Source: Ambulatory Visit | Attending: Family Medicine | Admitting: Family Medicine

## 2023-03-20 DIAGNOSIS — R300 Dysuria: Secondary | ICD-10-CM | POA: Diagnosis not present

## 2023-03-20 DIAGNOSIS — N3946 Mixed incontinence: Secondary | ICD-10-CM | POA: Diagnosis not present

## 2023-03-20 DIAGNOSIS — Z1231 Encounter for screening mammogram for malignant neoplasm of breast: Secondary | ICD-10-CM

## 2023-03-20 NOTE — Telephone Encounter (Signed)
Pt is asking for a refill . Is this ok to refill?

## 2023-03-20 NOTE — Telephone Encounter (Signed)
Pt aware Rx has been sent in.  

## 2023-04-23 ENCOUNTER — Other Ambulatory Visit: Payer: Self-pay | Admitting: Family Medicine

## 2023-04-23 NOTE — Telephone Encounter (Signed)
Pt aware of refill.

## 2023-04-23 NOTE — Telephone Encounter (Signed)
Pt states she is still taking the Meloxicam  as needed . Is it ok to refill ?

## 2023-05-09 DIAGNOSIS — R300 Dysuria: Secondary | ICD-10-CM | POA: Diagnosis not present

## 2023-05-09 DIAGNOSIS — R351 Nocturia: Secondary | ICD-10-CM | POA: Diagnosis not present

## 2023-05-09 DIAGNOSIS — N3021 Other chronic cystitis with hematuria: Secondary | ICD-10-CM | POA: Diagnosis not present

## 2023-05-09 DIAGNOSIS — R8271 Bacteriuria: Secondary | ICD-10-CM | POA: Diagnosis not present

## 2023-05-09 DIAGNOSIS — N952 Postmenopausal atrophic vaginitis: Secondary | ICD-10-CM | POA: Diagnosis not present

## 2023-05-16 ENCOUNTER — Ambulatory Visit (INDEPENDENT_AMBULATORY_CARE_PROVIDER_SITE_OTHER): Payer: Medicare HMO | Admitting: *Deleted

## 2023-05-16 DIAGNOSIS — Z0000 Encounter for general adult medical examination without abnormal findings: Secondary | ICD-10-CM

## 2023-05-16 NOTE — Progress Notes (Signed)
Subjective:   Elizabeth [name] is a 82 y.o. female who presents for Medicare Annual (Subsequent) preventive examination.  Visit Complete: Virtual  I connected with  Elizabeth [name] on 05/16/23 by a audio enabled telemedicine application and verified that I am speaking with the correct person using two identifiers.  Patient Location: Home  Provider Location: Home Office  I discussed the limitations of evaluation and management by telemedicine. The patient expressed understanding and agreed to proceed.    Review of Systems     Cardiac Risk Factors include: advanced age (>36men, >72 women)     Objective:    Today's Vitals   There is no height or weight on file to calculate BMI.     05/16/2023    9:23 AM 12/14/2022    4:33 PM 08/01/2022    3:53 PM 07/13/2022   11:03 AM 07/07/2022   12:09 PM 07/05/2022   12:00 PM 06/22/2022    1:59 PM  Advanced Directives  Does Patient Have a Medical Advance Directive? Yes Yes Yes Yes No;Yes No;Yes Yes  Type of Sales promotion account executive of [employer] Power of [place];Living will Healthcare Power of [place];Living will   Healthcare Power of [place];Living will  Does patient want to make changes to medical advance directive?  No - Patient declined No - Patient declined No - Patient declined No - Patient declined No - Patient declined   Copy of Healthcare Power of Attorney in Chart? No - copy requested  No - copy requested Yes - validated most recent copy scanned in chart (See row information)       Current Medications (verified) Outpatient Encounter Medications as of 05/16/2023  Medication Sig   Biotin (BIOTIN 5000) 5 MG CAPS Take by mouth.   docusate sodium (COLACE) 100 MG capsule Take 100 mg by mouth 2 (two) times daily.   estradiol (ESTRACE) 0.1 MG/GM vaginal cream Place vaginally 3 (three) times a week.   gabapentin (NEURONTIN) 600 MG tablet Take 1 tablet (600 mg total) by mouth  3 (three) times daily.   loratadine (CLARITIN) 10 MG tablet Take 10 mg by mouth daily.   methocarbamol (ROBAXIN) 500 MG tablet Take 1-2 tablets (500-1,000 mg total) by mouth 4 (four) times daily as needed for muscle spasms.   Multiple Vitamin (MULTIVITAMIN) capsule Take 1 capsule by mouth daily.   omeprazole (PRILOSEC) 20 MG capsule Take 1 capsule (20 mg total) by mouth daily.   Probiotic Product (ADVANCED PROBIOTIC 10) CAPS Take 1 capsule by mouth daily.   meloxicam (MOBIC) 15 MG tablet TAKE 1 TABLET (15 MG TOTAL) BY MOUTH DAILY. (Patient not taking: Reported on 05/16/2023)   No facility-administered encounter medications on file as of 05/16/2023.    Allergies (verified) Patient has no known allergies.   History: Past Medical History:  Diagnosis Date   Arthritis    Cancer (HCC)    left breast cancer   Chicken pox    GERD (gastroesophageal reflux disease)    History of radiation therapy 06/26/17- 07/24/17   Left Breast 50.05 Gy total   Personal history of radiation therapy    Shingles 07/2002   Sleep apnea    mild uses oral devise   Urinary tract infection    Past Surgical History:  Procedure Laterality Date   BREAST BIOPSY Bilateral 03/07/2017   benign   BREAST BIOPSY Right 03/26/2017   benign   BREAST BIOPSY Left 03/26/2017   malignant   BREAST  LUMPECTOMY Left 05/03/2017   BREAST LUMPECTOMY WITH RADIOACTIVE SEED LOCALIZATION Left 05/03/2017   Procedure: LEFT BREAST LUMPECTOMY WITH RADIOACTIVE SEED LOCALIZATION;  Surgeon: Griselda Miner, MD;  Location: [facility];  Service: General;  Laterality: Left;   COLONOSCOPY     CYST EXCISION Right 03/10/2022   Procedure: EXCISION SEBACEOUS CYST RIGHT HIP;  Surgeon: Griselda Miner, MD;  Location: [facility];  Service: General;  Laterality: Right;   HYSTEROSCOPY     Dr Nicholas Lose   SPINAL FUSION  12/01/2016   T10 to Pelvis   TOTAL HIP ARTHROPLASTY 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/2021   Procedure: TOTAL HIP ARTHROPLASTY ANTERIOR APPROACH;  Surgeon:  Ollen Gross, MD;  Location: [facility];  Service: Orthopedics;  Laterality: Left;   TOTAL HIP ARTHROPLASTY Right 07/05/2022   Procedure: TOTAL HIP ARTHROPLASTY ANTERIOR APPROACH;  Surgeon: Ollen Gross, MD;  Location: [facility];  Service: Orthopedics;  Laterality: Right;   UPPER GASTROINTESTINAL ENDOSCOPY  11/30/2020   WISDOM TOOTH EXTRACTION     age 50's   Family History  Problem Relation Age of Onset   Diabetes Mother    Arthritis Father    Cancer Maternal Aunt        liver   Heart disease Paternal Grandfather    Prostate cancer Brother    Colon cancer Neg Hx    Pancreatic cancer Neg Hx    Stomach cancer Neg Hx    Social History   Socioeconomic History   Marital status: Married    Spouse name: Not on file   Number of children: 2   Years of education: Not on file   Highest education level: Bachelor's degree (e.g., BA, AB, BS)  Occupational History   Occupation: retired  Tobacco Use   Smoking status: Former    Types: Cigarettes    Quit date: 02/18/1981    Years since quitting: 42.2   Smokeless tobacco: Never  Vaping Use   Vaping Use: Never used  Substance and Sexual Activity   Alcohol use: Yes    Alcohol/week: 2.0 standard drinks of alcohol    Types: 2 Glasses of wine per week    Comment: glass of wine daily   Drug use: No   Sexual activity: Yes  Other Topics Concern   Not on file  Social History Narrative   ** Merged History Encounter **       Social Determinants of Health   Financial Resource Strain: Low Risk  (05/16/2023)   Overall Financial Resource Strain (CARDIA)    Difficulty of Paying Living Expenses: Not hard at all  Food Insecurity: No Food Insecurity (05/16/2023)   Hunger Vital Sign    Worried About Running Out of Food in the Last Year: Never true    Ran Out of Food in the Last Year: Never true  Transportation Needs: No Transportation Needs (05/16/2023)   PRAPARE - Administrator, Civil Service (Medical): No    Lack of Transportation  (Non-Medical): No  Physical Activity: Sufficiently Active (05/16/2023)   Exercise Vital Sign    Days of Exercise per Week: 4 days    Minutes of Exercise per Session: 50 min  Recent Concern: Physical Activity - Insufficiently Active (05/16/2023)   Exercise Vital Sign    Days of Exercise per Week: 3 days    Minutes of Exercise per Session: 40 min  Stress: No Stress Concern Present (05/16/2023)   [employer] of Occupational Health - Occupational Stress Questionnaire    Feeling of Stress : Not  at all  Social Connections: Socially Integrated (05/16/2023)   Social Connection and Isolation Panel [NHANES]    Frequency of Communication with Friends and Family: More than three times a week    Frequency of Social Gatherings with Friends and Family: More than three times a week    Attends Religious Services: More than 4 times per year    Active Member of [employer] or Organizations: Yes    Attends Engineer, structural: More than 4 times per year    Marital Status: Married    Tobacco Counseling Counseling given: Not Answered   Clinical Intake:  Pre-visit preparation completed: Yes  Pain : No/denies pain     Diabetes: No  How often do you need to have someone help you when you read instructions, pamphlets, or other written materials from your doctor or pharmacy?: 1 - Never  Interpreter Needed?: No  Information entered by :: Remi Haggard LPN   Activities of Daily Living    05/16/2023    9:06 AM 08/16/2022   10:05 AM  In your present state of health, do you have any difficulty performing the following activities:  Hearing? 1 0  Comment bilateral hearing aids   Vision? 0 0  Difficulty concentrating or making decisions? 0 0  Walking or climbing stairs? 0 0  Dressing or bathing? 0 0  Doing errands, shopping? 0   Preparing Food and eating ? N   Using the Toilet? N   In the past six months, have you accidently leaked urine? N   Do you have problems with loss of bowel control?  N   Managing your Medications? N   Managing your Finances? N   Housekeeping or managing your Housekeeping? N     Patient Care Team: Sheliah Hatch, MD as PCP - General (Family Medicine) Huel Cote, MD as Consulting Physician (Obstetrics and Gynecology) Margarita Rana, MD (Neurosurgery) Regal, Kirstie Peri, DPM as Consulting Physician (Podiatry) Blima Ledger, OD (Optometry) Griselda Miner, MD as Consulting Physician (General Surgery) Serena Croissant, MD as Consulting Physician (Hematology and Oncology) Lonie Peak, MD as Attending Physician (Radiation Oncology) Loa Socks, NP as Nurse Practitioner (Hematology and Oncology) Hilarie Fredrickson, MD as Consulting Physician (Gastroenterology)  Indicate any recent Medical Services you may have received from other than [facility] providers in the past year (date may be approximate).     Assessment:   This is a routine wellness examination for Elizabeth [name].  Hearing/Vision screen Hearing Screening - Comments:: Bilateral hearing aids Vision Screening - Comments:: Up to date miller  Dietary issues and exercise activities discussed:     Goals Addressed             This Visit's Progress    Patient Stated       Would like to be able to walk longer distances       Depression Screen    05/16/2023    9:11 AM 02/21/2023    8:44 AM 12/14/2022    4:30 PM 08/16/2022   10:04 AM 08/01/2022    3:51 PM 03/06/2022   10:51 AM 11/23/2021    8:37 AM  PHQ 2/9 Scores  PHQ - 2 Score 0 0 0 1 0 0 0  PHQ- 9 Score 0 0  6  0 2    Fall Risk    05/16/2023    9:08 AM 02/21/2023    8:45 AM 08/16/2022   10:05 AM 08/01/2022    3:49 PM 07/29/2022    9:28  AM  Fall Risk   Falls in the past year? 0 0 0 0 0  Number falls in past yr: 0 0  0   Injury with Fall? 0 0  0   Risk for fall due to :  No Fall Risks No Fall Risks No Fall Risks   Follow up Falls evaluation completed;Education provided;Falls prevention discussed Falls evaluation completed Falls  evaluation completed Falls prevention discussed     MEDICARE RISK AT HOME:  Medicare Risk at Home - 05/16/23 0907     Any stairs in or around the home? No    If so, are there any without handrails? No    Home free of loose throw rugs in walkways, pet beds, electrical cords, etc? Yes    Adequate lighting in your home to reduce risk of falls? Yes    Life alert? No    Use of a cane, walker or w/c? Yes    Grab bars in the bathroom? Yes    Shower chair or bench in shower? Yes    Elevated toilet seat or a handicapped toilet? Yes             TIMED UP AND GO:  Was the test performed?  No    Cognitive Function:    03/21/2018    9:27 AM  MMSE - Mini Mental State Exam  Orientation to time 5  Orientation to Place 5  Registration 3  Attention/ Calculation 3  Recall 3  Language- name 2 objects 2  Language- repeat 1  Language- follow 3 step command 3  Language- read & follow direction 1  Write a sentence 1  Copy design 1  Total score 28        05/16/2023    9:09 AM 08/01/2022    3:54 PM 06/28/2020    9:56 AM  6CIT Screen  What Year? 0 points 0 points 0 points  What month? 0 points 0 points 0 points  What time? 0 points 0 points 0 points  Count back from 20 2 points 0 points 0 points  Months in reverse 0 points 0 points 0 points  Repeat phrase 0 points 0 points 0 points  Total Score 2 points 0 points 0 points    Immunizations Immunization History  Administered Date(s) Administered   COVID-19, mRNA, vaccine(Comirnaty)12 years and older 09/19/2022   Fluad Quad(high Dose 65+) 08/06/2019, 08/09/2020, 08/12/2021   Influenza, High Dose Seasonal PF 09/12/2013, 09/24/2015, 08/24/2016, 08/08/2018, 08/16/2022   Influenza,inj,Quad PF,6+ Mos 08/24/2014, 07/30/2017   Influenza-Unspecified 09/12/2013   PFIZER Comirnaty(Gray Top)Covid-19 Tri-Sucrose Vaccine 04/11/2021   PFIZER(Purple Top)SARS-COV-2 Vaccination 12/04/2019, 12/25/2019   Pfizer Covid-19 Vaccine Bivalent Booster 83yrs &  up 11/03/2021   Pneumococcal Conjugate-13 07/22/2008, 12/17/2014   Pneumococcal Polysaccharide-23 10/06/2016   Tetanus 10/08/2013   Zoster Recombinat (Shingrix) 11/07/2018, 05/21/2019   Zoster, Live 07/22/2008    TDAP status: Up to date  Flu Vaccine status: Up to date  Pneumococcal vaccine status: Up to date  Covid-19 vaccine status: Information provided on how to obtain vaccines.   Qualifies for Shingles Vaccine? No   Zostavax completed Yes   Shingrix Completed?: Yes  Screening Tests Health Maintenance  Topic Date Due   INFLUENZA VACCINE  06/21/2023   Medicare Annual Wellness (AWV)  05/15/2024   Pneumonia Vaccine 38+ Years old  Completed   DEXA SCAN  Completed   Zoster Vaccines- Shingrix  Completed   HPV VACCINES  Aged Out   DTaP/Tdap/Td  Discontinued   COVID-19  Vaccine  Discontinued   Hepatitis C Screening  Discontinued    Health Maintenance  There are no preventive care reminders to display for this patient.  Colorectal cancer screening: No longer required.   Mammogram status: Completed 2024. Repeat every year  Bone Density status: Completed 2022. Results reflect: Bone density results: OSTEOPOROSIS. Repeat every   years.  Lung Cancer Screening: (Low Dose CT Chest recommended if Age 47-80 years, 20 pack-year currently smoking OR have quit w/in 15years.) does not qualify.   Lung Cancer Screening Referral:   Additional Screening:  Hepatitis C Screening: does not qualify;  Vision Screening: Recommended annual ophthalmology exams for early detection of glaucoma and other disorders of the eye. Is the patient up to date with their annual eye exam?  yes Who is the provider or what is the name of the office in which the patient attends annual eye exams? [facility] If pt is not established with a provider, would they like to be referred to a provider to establish care? No .   Dental Screening: Recommended annual dental exams for proper oral hygiene    Community  Resource Referral / Chronic Care Management: CRR required this visit?  No   CCM required this visit?  No     Plan:     I have personally reviewed and noted the following in the patient's chart:   Medical and social history Use of alcohol, tobacco or illicit drugs  Current medications and supplements including opioid prescriptions. Patient is not currently taking opioid prescriptions. Functional ability and status Nutritional status Physical activity Advanced directives List of other physicians Hospitalizations, surgeries, and ER visits in previous 12 months Vitals Screenings to include cognitive, depression, and falls Referrals and appointments  In addition, I have reviewed and discussed with patient certain preventive protocols, quality metrics, and best practice recommendations. A written personalized care plan for preventive services as well as general preventive health recommendations were provided to patient.     Remi Haggard, LPN   6/60/6301   After Visit Summary: (MyChart) Due to this being a telephonic visit, the after visit summary with patients personalized plan was offered to patient via MyChart   Nurse Notes:

## 2023-05-16 NOTE — Patient Instructions (Signed)
Elizabeth [name] , Thank you for taking time to come for your Medicare Wellness Visit. I appreciate your ongoing commitment to your health goals. Please review the following plan we discussed and let me know if I can assist you in the future.   Screening recommendations/referrals: Colonoscopy: no longer required Mammogram: up to date Bone Density: up to date Recommended yearly ophthalmology/optometry visit for glaucoma screening and checkup Recommended yearly dental visit for hygiene and checkup  Vaccinations: Influenza vaccine: up to date Pneumococcal vaccine: up to date Tdap vaccine: up to date Shingles vaccine: up to date      Preventive Care 65 Years and Older, Female Preventive care refers to lifestyle choices and visits with your health care provider that can promote health and wellness. What does preventive care include? A yearly physical exam. This is also called an annual well check. Dental exams once or twice a year. Routine eye exams. Ask your health care provider how often you should have your eyes checked. Personal lifestyle choices, including: Daily care of your teeth and gums. Regular physical activity. Eating a healthy diet. Avoiding tobacco and drug use. Limiting alcohol use. Practicing safe sex. Taking low-dose aspirin every day. Taking vitamin and mineral supplements as recommended by your health care provider. What happens during an annual well check? The services and screenings done by your health care provider during your annual well check will depend on your age, overall health, lifestyle risk factors, and family history of disease. Counseling  Your health care provider may ask you questions about your: Alcohol use. Tobacco use. Drug use. Emotional well-being. Home and relationship well-being. Sexual activity. Eating habits. History of falls. Memory and ability to understand (cognition). Work and work Astronomer. Reproductive health. Screening   You may have the following tests or measurements: Height, weight, and BMI. Blood pressure. Lipid and cholesterol levels. These may be checked every 5 years, or more frequently if you are over 41 years old. Skin check. Lung cancer screening. You may have this screening every year starting at age 38 if you have a 30-pack-year history of smoking and currently smoke or have quit within the past 15 years. Fecal occult blood test (FOBT) of the stool. You may have this test every year starting at age 31. Flexible sigmoidoscopy or colonoscopy. You may have a sigmoidoscopy every 5 years or a colonoscopy every 10 years starting at age 22. Hepatitis C blood test. Hepatitis B blood test. Sexually transmitted disease (STD) testing. Diabetes screening. This is done by checking your blood sugar (glucose) after you have not eaten for a while (fasting). You may have this done every 1-3 years. Bone density scan. This is done to screen for osteoporosis. You may have this done starting at age 36. Mammogram. This may be done every 1-2 years. Talk to your health care provider about how often you should have regular mammograms. Talk with your health care provider about your test results, treatment options, and if necessary, the need for more tests. Vaccines  Your health care provider may recommend certain vaccines, such as: Influenza vaccine. This is recommended every year. Tetanus, diphtheria, and acellular pertussis (Tdap, Td) vaccine. You may need a Td booster every 10 years. Zoster vaccine. You may need this after age 2. Pneumococcal 13-valent conjugate (PCV13) vaccine. One dose is recommended after age 38. Pneumococcal polysaccharide (PPSV23) vaccine. One dose is recommended after age 32. Talk to your health care provider about which screenings and vaccines you need and how often you need them. This  information is not intended to replace advice given to you by your health care provider. Make sure you discuss  any questions you have with your health care provider. Document Released: 12/03/2015 Document Revised: 07/26/2016 Document Reviewed: 09/07/2015 Elsevier Interactive Patient Education  2017 [employer].  Fall Prevention in the Home Falls can cause injuries. They can happen to people of all ages. There are many things you can do to make your home safe and to help prevent falls. What can I do on the outside of my home? Regularly fix the edges of walkways and driveways and fix any cracks. Remove anything that might make you trip as you walk through a door, such as a raised step or threshold. Trim any bushes or trees on the path to your home. Use bright outdoor lighting. Clear any walking paths of anything that might make someone trip, such as rocks or tools. Regularly check to see if handrails are loose or broken. Make sure that both sides of any steps have handrails. Any raised decks and porches should have guardrails on the edges. Have any leaves, snow, or ice cleared regularly. Use sand or salt on walking paths during winter. Clean up any spills in your garage right away. This includes oil or grease spills. What can I do in the bathroom? Use night lights. Install grab bars by the toilet and in the tub and shower. Do not use towel bars as grab bars. Use non-skid mats or decals in the tub or shower. If you need to sit down in the shower, use a plastic, non-slip stool. Keep the floor dry. Clean up any water that spills on the floor as soon as it happens. Remove soap buildup in the tub or shower regularly. Attach bath mats securely with double-sided non-slip rug tape. Do not have throw rugs and other things on the floor that can make you trip. What can I do in the bedroom? Use night lights. Make sure that you have a light by your bed that is easy to reach. Do not use any sheets or blankets that are too big for your bed. They should not hang down onto the floor. Have a firm chair that has  side arms. You can use this for support while you get dressed. Do not have throw rugs and other things on the floor that can make you trip. What can I do in the kitchen? Clean up any spills right away. Avoid walking on wet floors. Keep items that you use a lot in easy-to-reach places. If you need to reach something above you, use a strong step stool that has a grab bar. Keep electrical cords out of the way. Do not use floor polish or wax that makes floors slippery. If you must use wax, use non-skid floor wax. Do not have throw rugs and other things on the floor that can make you trip. What can I do with my stairs? Do not leave any items on the stairs. Make sure that there are handrails on both sides of the stairs and use them. Fix handrails that are broken or loose. Make sure that handrails are as long as the stairways. Check any carpeting to make sure that it is firmly attached to the stairs. Fix any carpet that is loose or worn. Avoid having throw rugs at the top or bottom of the stairs. If you do have throw rugs, attach them to the floor with carpet tape. Make sure that you have a light switch at the top  of the stairs and the bottom of the stairs. If you do not have them, ask someone to add them for you. What else can I do to help prevent falls? Wear shoes that: Do not have high heels. Have rubber bottoms. Are comfortable and fit you well. Are closed at the toe. Do not wear sandals. If you use a stepladder: Make sure that it is fully opened. Do not climb a closed stepladder. Make sure that both sides of the stepladder are locked into place. Ask someone to hold it for you, if possible. Clearly mark and make sure that you can see: Any grab bars or handrails. First and last steps. Where the edge of each step is. Use tools that help you move around (mobility aids) if they are needed. These include: Canes. Walkers. Scooters. Crutches. Turn on the lights when you go into a dark area.  Replace any light bulbs as soon as they burn out. Set up your furniture so you have a clear path. Avoid moving your furniture around. If any of your floors are uneven, fix them. If there are any pets around you, be aware of where they are. Review your medicines with your doctor. Some medicines can make you feel dizzy. This can increase your chance of falling. Ask your doctor what other things that you can do to help prevent falls. This information is not intended to replace advice given to you by your health care provider. Make sure you discuss any questions you have with your health care provider. Document Released: 09/02/2009 Document Revised: 04/13/2016 Document Reviewed: 12/11/2014 Elsevier Interactive Patient Education  2017 [employer].

## 2023-05-22 ENCOUNTER — Other Ambulatory Visit: Payer: Self-pay | Admitting: Family Medicine

## 2023-05-28 DIAGNOSIS — N3021 Other chronic cystitis with hematuria: Secondary | ICD-10-CM | POA: Diagnosis not present

## 2023-05-28 DIAGNOSIS — R8271 Bacteriuria: Secondary | ICD-10-CM | POA: Diagnosis not present

## 2023-06-14 DIAGNOSIS — N3021 Other chronic cystitis with hematuria: Secondary | ICD-10-CM | POA: Diagnosis not present

## 2023-06-14 DIAGNOSIS — R300 Dysuria: Secondary | ICD-10-CM | POA: Diagnosis not present

## 2023-06-26 ENCOUNTER — Other Ambulatory Visit: Payer: Self-pay | Admitting: Family Medicine

## 2023-06-26 DIAGNOSIS — M25552 Pain in left hip: Secondary | ICD-10-CM

## 2023-06-26 NOTE — Telephone Encounter (Signed)
If medication is not helping, she does not need to take it

## 2023-06-26 NOTE — Telephone Encounter (Signed)
I spoke to the pt and she states she does it take it some .  Is this ok to refill ?  She states she does not see where it helps a lot . She is asking if she should continue taking this ?

## 2023-07-05 ENCOUNTER — Encounter (INDEPENDENT_AMBULATORY_CARE_PROVIDER_SITE_OTHER): Payer: Self-pay

## 2023-07-31 ENCOUNTER — Other Ambulatory Visit: Payer: Self-pay | Admitting: Family Medicine

## 2023-08-03 DIAGNOSIS — N3021 Other chronic cystitis with hematuria: Secondary | ICD-10-CM | POA: Diagnosis not present

## 2023-08-03 DIAGNOSIS — R8271 Bacteriuria: Secondary | ICD-10-CM | POA: Diagnosis not present

## 2023-08-03 DIAGNOSIS — N3000 Acute cystitis without hematuria: Secondary | ICD-10-CM | POA: Diagnosis not present

## 2023-08-16 DIAGNOSIS — H5213 Myopia, bilateral: Secondary | ICD-10-CM | POA: Diagnosis not present

## 2023-08-17 ENCOUNTER — Ambulatory Visit (INDEPENDENT_AMBULATORY_CARE_PROVIDER_SITE_OTHER): Payer: Medicare HMO | Admitting: Family Medicine

## 2023-08-17 ENCOUNTER — Other Ambulatory Visit: Payer: Self-pay | Admitting: Family Medicine

## 2023-08-17 ENCOUNTER — Encounter: Payer: Self-pay | Admitting: Family Medicine

## 2023-08-17 VITALS — BP 122/72 | HR 78 | Temp 98.1°F | Ht 67.0 in | Wt 140.4 lb

## 2023-08-17 DIAGNOSIS — Z0000 Encounter for general adult medical examination without abnormal findings: Secondary | ICD-10-CM

## 2023-08-17 DIAGNOSIS — E785 Hyperlipidemia, unspecified: Secondary | ICD-10-CM | POA: Diagnosis not present

## 2023-08-17 DIAGNOSIS — Z23 Encounter for immunization: Secondary | ICD-10-CM

## 2023-08-17 LAB — CBC WITH DIFFERENTIAL/PLATELET
Basophils Absolute: 0 10*3/uL (ref 0.0–0.1)
Basophils Relative: 0.9 % (ref 0.0–3.0)
Eosinophils Absolute: 0 10*3/uL (ref 0.0–0.7)
Eosinophils Relative: 0.6 % (ref 0.0–5.0)
HCT: 30.9 % — ABNORMAL LOW (ref 36.0–46.0)
Hemoglobin: 10.4 g/dL — ABNORMAL LOW (ref 12.0–15.0)
Lymphocytes Relative: 15.8 % (ref 12.0–46.0)
Lymphs Abs: 0.7 10*3/uL (ref 0.7–4.0)
MCHC: 33.6 g/dL (ref 30.0–36.0)
MCV: 99.1 fL (ref 78.0–100.0)
Monocytes Absolute: 0.4 10*3/uL (ref 0.1–1.0)
Monocytes Relative: 9.7 % (ref 3.0–12.0)
Neutro Abs: 3.2 10*3/uL (ref 1.4–7.7)
Neutrophils Relative %: 73 % (ref 43.0–77.0)
Platelets: 266 10*3/uL (ref 150.0–400.0)
RBC: 3.12 Mil/uL — ABNORMAL LOW (ref 3.87–5.11)
RDW: 12.1 % (ref 11.5–15.5)
WBC: 4.4 10*3/uL (ref 4.0–10.5)

## 2023-08-17 LAB — HEPATIC FUNCTION PANEL
ALT: 14 U/L (ref 0–35)
AST: 19 U/L (ref 0–37)
Albumin: 4.3 g/dL (ref 3.5–5.2)
Alkaline Phosphatase: 56 U/L (ref 39–117)
Bilirubin, Direct: 0.2 mg/dL (ref 0.0–0.3)
Total Bilirubin: 1 mg/dL (ref 0.2–1.2)
Total Protein: 7.5 g/dL (ref 6.0–8.3)

## 2023-08-17 LAB — TSH: TSH: 1.93 u[IU]/mL (ref 0.35–5.50)

## 2023-08-17 LAB — BASIC METABOLIC PANEL
BUN: 13 mg/dL (ref 6–23)
CO2: 25 meq/L (ref 19–32)
Calcium: 9.7 mg/dL (ref 8.4–10.5)
Chloride: 100 meq/L (ref 96–112)
Creatinine, Ser: 0.83 mg/dL (ref 0.40–1.20)
GFR: 66.46 mL/min (ref >60.00)
Glucose, Bld: 100 mg/dL — ABNORMAL HIGH (ref 70–99)
Potassium: 4.3 meq/L (ref 3.5–5.1)
Sodium: 134 meq/L — ABNORMAL LOW (ref 135–145)

## 2023-08-17 LAB — LIPID PANEL
Cholesterol: 187 mg/dL (ref 0–200)
HDL: 78.3 mg/dL (ref >39.00)
LDL Cholesterol: 95 mg/dL (ref 0–99)
NonHDL: 108.31
Total CHOL/HDL Ratio: 2
Triglycerides: 67 mg/dL (ref 0.0–149.0)
VLDL: 13.4 mg/dL (ref 0.0–40.0)

## 2023-08-17 MED ORDER — MONTELUKAST SODIUM 10 MG PO TABS: 10.0000 mg | ORAL_TABLET | Freq: Every day | ORAL | 1 refills | Status: AC

## 2023-08-17 NOTE — Patient Instructions (Signed)
Follow up in 1 year or as needed We'll notify you of your lab results and make any changes if needed Keep up the good work!  You look great!! Call with any questions or concerns Stay Safe!  Stay Healthy! Happy Fall!!! 

## 2023-08-17 NOTE — Progress Notes (Signed)
Subjective:    Patient ID: Elizabeth [name], female    DOB: Feb 23, 1941, 82 y.o.   MRN: [id number]  HPI CPE- UTD on immunizations.  Patient Care Team    Relationship Specialty Notifications Start End  Sheliah Hatch, MD PCP - General Family Medicine  12/26/12   Huel Cote, MD Consulting Physician Obstetrics and Gynecology  03/01/15   Margarita Rana, MD  Neurosurgery  03/15/17   Lenn Sink, DPM Consulting Physician Podiatry  03/15/17   Blima Ledger, OD  Optometry  03/15/17   Griselda Miner, MD Consulting Physician General Surgery  04/03/17   Serena Croissant, MD Consulting Physician Hematology and Oncology  04/03/17   Lonie Peak, MD Attending Physician Radiation Oncology  04/03/17   Loa Socks, NP Nurse Practitioner Hematology and Oncology  08/15/17   Hilarie Fredrickson, MD Consulting Physician Gastroenterology  08/09/20     Health Maintenance  Topic Date Due   Medicare Annual Wellness (AWV)  05/15/2024   Pneumonia Vaccine 26+ Years old  Completed   INFLUENZA VACCINE  Completed   DEXA SCAN  Completed   Zoster Vaccines- Shingrix  Completed   HPV VACCINES  Aged Out   DTaP/Tdap/Td  Discontinued   COVID-19 Vaccine  Discontinued   Hepatitis C Screening  Discontinued      Review of Systems Patient reports no vision/ hearing changes, adenopathy,fever, weight change,  persistant/recurrent hoarseness, swallowing issues, chest pain, palpitations, edema, persistant/recurrent cough, hemoptysis, dyspnea (rest/exertional/paroxysmal nocturnal), gastrointestinal bleeding (melena, rectal bleeding), abdominal pain, significant heartburn, bowel changes, GU symptoms (dysuria, hematuria, incontinence), Gyn symptoms (abnormal  bleeding, pain),  syncope, focal weakness, memory loss, numbness & tingling, skin/hair/nail changes, abnormal bruising or bleeding, anxiety, or depression.     Objective:   Physical Exam General Appearance:    Alert, cooperative, no distress, appears  younger than stated age  Head:    Normocephalic, without obvious abnormality, atraumatic  Eyes:    PERRL, conjunctiva/corneas clear, EOM's intact both eyes  Ears:    Normal TM's and external ear canals, both ears  Nose:   Nares normal, septum midline, mucosa normal, no drainage    or sinus tenderness  Throat:   Lips, mucosa, and tongue normal; teeth and gums normal  Neck:   Supple, symmetrical, trachea midline, no adenopathy;    Thyroid: no enlargement/tenderness/nodules  Back:     Symmetric, no curvature, ROM normal, no CVA tenderness  Lungs:     Clear to auscultation bilaterally, respirations unlabored  Chest Wall:    No tenderness or deformity   Heart:    Regular rate and rhythm, S1 and S2 normal, no murmur, rub   or gallop  Breast Exam:    Deferred to GYN  Abdomen:     Soft, non-tender, bowel sounds active all four quadrants,    no masses, no organomegaly  Genitalia:    Deferred to GYN  Rectal:    Extremities:   Extremities normal, atraumatic, no cyanosis or edema  Pulses:   2+ and symmetric all extremities  Skin:   Skin color, texture, turgor normal, no rashes or lesions  Lymph nodes:   Cervical, supraclavicular, and axillary nodes normal  Neurologic:   CNII-XII intact, normal strength, sensation and reflexes    throughout          Assessment & Plan:

## 2023-08-17 NOTE — Assessment & Plan Note (Signed)
Pt's PE WNL.  UTD on immunizations.  No longer doing colon cancer screening or mammogram.  Flu shot given today.  Check labs.  Anticipatory guidance provided.

## 2023-08-20 ENCOUNTER — Other Ambulatory Visit: Payer: Self-pay

## 2023-08-20 ENCOUNTER — Encounter: Payer: Self-pay | Admitting: Family Medicine

## 2023-08-20 ENCOUNTER — Telehealth: Payer: Self-pay

## 2023-08-20 DIAGNOSIS — D649 Anemia, unspecified: Secondary | ICD-10-CM

## 2023-08-20 DIAGNOSIS — Z23 Encounter for immunization: Secondary | ICD-10-CM

## 2023-08-20 NOTE — Telephone Encounter (Signed)
-----   Message from Neena Rhymes sent at 08/20/2023  7:20 AM EDT ----- Labs look great w/ exception of mildly low hemoglobin (blood count).  Please make sure you are taking a daily multivitamin w/ iron and we will have you return in 2 weeks for a lab only visit to make sure things are stable (CBC w/ diff, dx low hemoglobin)

## 2023-08-22 ENCOUNTER — Other Ambulatory Visit: Payer: Self-pay | Admitting: Family Medicine

## 2023-08-22 NOTE — Telephone Encounter (Signed)
Medication: Gabapentin 600 mg Directions: Take 1 tablet by mouth 3 times a day  Last given: 03/15/23 Number refills: 3 refills with 90 tablets  Last o/v: 08/17/23 Follow up: 08/16/23 Labs: 08/17/23

## 2023-08-23 DIAGNOSIS — N3021 Other chronic cystitis with hematuria: Secondary | ICD-10-CM | POA: Diagnosis not present

## 2023-08-23 DIAGNOSIS — R350 Frequency of micturition: Secondary | ICD-10-CM | POA: Diagnosis not present

## 2023-08-23 DIAGNOSIS — R300 Dysuria: Secondary | ICD-10-CM | POA: Diagnosis not present

## 2023-08-28 DIAGNOSIS — Z0100 Encounter for examination of eyes and vision without abnormal findings: Secondary | ICD-10-CM | POA: Diagnosis not present

## 2023-08-31 ENCOUNTER — Other Ambulatory Visit: Payer: Medicare HMO

## 2023-08-31 DIAGNOSIS — D649 Anemia, unspecified: Secondary | ICD-10-CM | POA: Diagnosis not present

## 2023-08-31 LAB — CBC WITH DIFFERENTIAL/PLATELET
Basophils Absolute: 0.1 10*3/uL (ref 0.0–0.1)
Basophils Relative: 1.1 % (ref 0.0–3.0)
Eosinophils Absolute: 0.1 10*3/uL (ref 0.0–0.7)
Eosinophils Relative: 1.3 % (ref 0.0–5.0)
HCT: 27.6 % — ABNORMAL LOW (ref 36.0–46.0)
Hemoglobin: 9.3 g/dL — ABNORMAL LOW (ref 12.0–15.0)
Lymphocytes Relative: 16.6 % (ref 12.0–46.0)
Lymphs Abs: 1.1 10*3/uL (ref 0.7–4.0)
MCHC: 33.8 g/dL (ref 30.0–36.0)
MCV: 102.3 fL — ABNORMAL HIGH (ref 78.0–100.0)
Monocytes Absolute: 0.5 10*3/uL (ref 0.1–1.0)
Monocytes Relative: 7.7 % (ref 3.0–12.0)
Neutro Abs: 4.8 10*3/uL (ref 1.4–7.7)
Neutrophils Relative %: 73.3 % (ref 43.0–77.0)
Platelets: 305 10*3/uL (ref 150.0–400.0)
RBC: 2.7 Mil/uL — ABNORMAL LOW (ref 3.87–5.11)
RDW: 14.2 % (ref 11.5–15.5)
WBC: 6.6 10*3/uL (ref 4.0–10.5)

## 2023-09-03 DIAGNOSIS — N3021 Other chronic cystitis with hematuria: Secondary | ICD-10-CM | POA: Diagnosis not present

## 2023-09-04 ENCOUNTER — Telehealth: Payer: Self-pay

## 2023-09-04 ENCOUNTER — Encounter: Payer: Self-pay | Admitting: Family Medicine

## 2023-09-04 DIAGNOSIS — D649 Anemia, unspecified: Secondary | ICD-10-CM

## 2023-09-04 NOTE — Telephone Encounter (Signed)
Spoke with patient, she is aware of her labs and aware of the supplements she needs. She will be coming to the office to pick up the iFob. I have placed that at the front desk for her, she is aware the office closes at 1pm today. I have ordered future labs and scheduled the lab visit.

## 2023-09-04 NOTE — Telephone Encounter (Signed)
-----   Message from Neena Rhymes sent at 09/03/2023  8:09 PM EDT ----- Your hemoglobin (blood count) continues to drop.  We are going to send you an iFOB (a stool test) that will assess for blood loss in your gut (or if you prefer, you can pick it up at the office).  Please complete this test and either drop it off at our office or the [facility] lab at [address] [facility].  DO NOT return it via mail.  Please add a daily OTC B12 and folate supplement to help improve your hemoglobin.  We will also repeat your CBC at a lab only visit in 1 week as well as check a B12 and folate level and iron panel(dx- low hemoglobin)

## 2023-09-04 NOTE — Telephone Encounter (Signed)
Patient has picked up iFOB today 10:25 am

## 2023-09-06 ENCOUNTER — Ambulatory Visit (INDEPENDENT_AMBULATORY_CARE_PROVIDER_SITE_OTHER): Payer: Medicare HMO

## 2023-09-06 ENCOUNTER — Other Ambulatory Visit: Payer: Self-pay

## 2023-09-06 DIAGNOSIS — D649 Anemia, unspecified: Secondary | ICD-10-CM

## 2023-09-07 ENCOUNTER — Telehealth: Payer: Self-pay

## 2023-09-07 LAB — FECAL OCCULT BLOOD, IMMUNOCHEMICAL: Fecal Occult Bld: NEGATIVE

## 2023-09-07 NOTE — Telephone Encounter (Signed)
-----   Message from Neena Rhymes sent at 09/07/2023  7:48 AM EDT ----- No evidence of GI blood loss- great news!

## 2023-09-11 ENCOUNTER — Other Ambulatory Visit: Payer: Medicare HMO

## 2023-09-11 ENCOUNTER — Other Ambulatory Visit (INDEPENDENT_AMBULATORY_CARE_PROVIDER_SITE_OTHER): Payer: Medicare HMO

## 2023-09-11 DIAGNOSIS — D649 Anemia, unspecified: Secondary | ICD-10-CM

## 2023-09-11 LAB — CBC WITH DIFFERENTIAL/PLATELET
Basophils Absolute: 0.1 10*3/uL (ref 0.0–0.1)
Basophils Relative: 1.1 % (ref 0.0–3.0)
Eosinophils Absolute: 0 10*3/uL (ref 0.0–0.7)
Eosinophils Relative: 0.6 % (ref 0.0–5.0)
HCT: 29.4 % — ABNORMAL LOW (ref 36.0–46.0)
Hemoglobin: 10 g/dL — ABNORMAL LOW (ref 12.0–15.0)
Lymphocytes Relative: 15.7 % (ref 12.0–46.0)
Lymphs Abs: 0.9 10*3/uL (ref 0.7–4.0)
MCHC: 33.9 g/dL (ref 30.0–36.0)
MCV: 105.5 fL — ABNORMAL HIGH (ref 78.0–100.0)
Monocytes Absolute: 0.4 10*3/uL (ref 0.1–1.0)
Monocytes Relative: 7.7 % (ref 3.0–12.0)
Neutro Abs: 4.3 10*3/uL (ref 1.4–7.7)
Neutrophils Relative %: 74.9 % (ref 43.0–77.0)
Platelets: 251 10*3/uL (ref 150.0–400.0)
RBC: 2.79 Mil/uL — ABNORMAL LOW (ref 3.87–5.11)
RDW: 14 % (ref 11.5–15.5)
WBC: 5.7 10*3/uL (ref 4.0–10.5)

## 2023-09-11 LAB — B12 AND FOLATE PANEL
Folate: >24.2 ng/mL (ref >5.9)
Vitamin B-12: 712 pg/mL (ref 211–911)

## 2023-09-12 ENCOUNTER — Encounter: Payer: Self-pay | Admitting: Family Medicine

## 2023-09-12 ENCOUNTER — Telehealth: Payer: Self-pay

## 2023-09-12 LAB — IRON,TIBC AND FERRITIN PANEL
%SAT: 34 % (ref 16–45)
Ferritin: 261 ng/mL (ref 16–288)
Iron: 105 ug/dL (ref 45–160)
TIBC: 311 ug/dL (ref 250–450)

## 2023-09-12 NOTE — Telephone Encounter (Signed)
-----   Message from Neena Rhymes sent at 09/12/2023 11:57 AM EDT ----- Hemoglobin is improving.  Now up to 10 which is more in range with where it has been.  Iron levels and B12 look good.  Make sure you are taking a daily multivitamin with iron.

## 2023-10-05 DIAGNOSIS — D485 Neoplasm of uncertain behavior of skin: Secondary | ICD-10-CM | POA: Diagnosis not present

## 2023-10-05 DIAGNOSIS — L723 Sebaceous cyst: Secondary | ICD-10-CM | POA: Diagnosis not present

## 2023-12-05 ENCOUNTER — Other Ambulatory Visit (HOSPITAL_BASED_OUTPATIENT_CLINIC_OR_DEPARTMENT_OTHER): Payer: Self-pay

## 2023-12-05 MED ORDER — AREXVY 120 MCG/0.5ML IM SUSR
0.5000 mL | Freq: Once | INTRAMUSCULAR | 0 refills | Status: AC
  Filled 2023-12-05: qty 0.5, 1d supply, fill #0

## 2023-12-06 DIAGNOSIS — D0512 Intraductal carcinoma in situ of left breast: Secondary | ICD-10-CM | POA: Diagnosis not present

## 2023-12-08 ENCOUNTER — Other Ambulatory Visit: Payer: Self-pay | Admitting: Internal Medicine

## 2023-12-08 DIAGNOSIS — R1310 Dysphagia, unspecified: Secondary | ICD-10-CM

## 2024-01-24 ENCOUNTER — Other Ambulatory Visit: Payer: Self-pay | Admitting: Family Medicine

## 2024-01-24 DIAGNOSIS — Z01419 Encounter for gynecological examination (general) (routine) without abnormal findings: Secondary | ICD-10-CM | POA: Diagnosis not present

## 2024-01-24 DIAGNOSIS — Z0000 Encounter for general adult medical examination without abnormal findings: Secondary | ICD-10-CM

## 2024-02-19 ENCOUNTER — Encounter: Payer: Self-pay | Admitting: Internal Medicine

## 2024-03-20 ENCOUNTER — Ambulatory Visit
Admission: RE | Admit: 2024-03-20 | Discharge: 2024-03-20 | Disposition: A | Discharge status: Home / Self Care | Source: Ambulatory Visit | Attending: Family Medicine | Admitting: Family Medicine

## 2024-03-20 DIAGNOSIS — Z1231 Encounter for screening mammogram for malignant neoplasm of breast: Secondary | ICD-10-CM | POA: Diagnosis not present

## 2024-03-20 DIAGNOSIS — Z0000 Encounter for general adult medical examination without abnormal findings: Secondary | ICD-10-CM

## 2024-04-30 ENCOUNTER — Other Ambulatory Visit: Payer: Self-pay | Admitting: Family Medicine

## 2024-06-10 DIAGNOSIS — M25562 Pain in left knee: Secondary | ICD-10-CM | POA: Diagnosis not present

## 2024-06-11 DIAGNOSIS — H57813 Brow ptosis, bilateral: Secondary | ICD-10-CM | POA: Diagnosis not present

## 2024-06-11 DIAGNOSIS — H02411 Mechanical ptosis of right eyelid: Secondary | ICD-10-CM | POA: Diagnosis not present

## 2024-06-11 DIAGNOSIS — H02423 Myogenic ptosis of bilateral eyelids: Secondary | ICD-10-CM | POA: Diagnosis not present

## 2024-06-11 DIAGNOSIS — H02831 Dermatochalasis of right upper eyelid: Secondary | ICD-10-CM | POA: Diagnosis not present

## 2024-06-11 DIAGNOSIS — H02412 Mechanical ptosis of left eyelid: Secondary | ICD-10-CM | POA: Diagnosis not present

## 2024-06-11 DIAGNOSIS — Z01818 Encounter for other preprocedural examination: Secondary | ICD-10-CM | POA: Diagnosis not present

## 2024-06-11 DIAGNOSIS — H02422 Myogenic ptosis of left eyelid: Secondary | ICD-10-CM | POA: Diagnosis not present

## 2024-06-11 DIAGNOSIS — H53483 Generalized contraction of visual field, bilateral: Secondary | ICD-10-CM | POA: Diagnosis not present

## 2024-06-11 DIAGNOSIS — H02413 Mechanical ptosis of bilateral eyelids: Secondary | ICD-10-CM | POA: Diagnosis not present

## 2024-06-11 DIAGNOSIS — H02421 Myogenic ptosis of right eyelid: Secondary | ICD-10-CM | POA: Diagnosis not present

## 2024-06-11 DIAGNOSIS — H02834 Dermatochalasis of left upper eyelid: Secondary | ICD-10-CM | POA: Diagnosis not present

## 2024-06-11 DIAGNOSIS — H0279 Other degenerative disorders of eyelid and periocular area: Secondary | ICD-10-CM | POA: Diagnosis not present

## 2024-06-18 ENCOUNTER — Other Ambulatory Visit (HOSPITAL_BASED_OUTPATIENT_CLINIC_OR_DEPARTMENT_OTHER): Payer: Self-pay

## 2024-06-18 MED ORDER — METHENAMINE HIPPURATE 1 G PO TABS
1.0000 g | ORAL_TABLET | Freq: Every day | ORAL | 1 refills | Status: DC
  Filled 2024-08-09: qty 90, 90d supply, fill #0

## 2024-06-18 MED ORDER — FOSFOMYCIN TROMETHAMINE 3 G PO PACK: PACK | ORAL | 6 refills | Status: DC

## 2024-06-18 MED ORDER — PHENAZOPYRIDINE HCL 200 MG PO TABS: 200.0000 mg | ORAL_TABLET | Freq: Three times a day (TID) | ORAL | 6 refills | Status: AC

## 2024-06-23 DIAGNOSIS — H53483 Generalized contraction of visual field, bilateral: Secondary | ICD-10-CM | POA: Diagnosis not present

## 2024-07-08 DIAGNOSIS — M1712 Unilateral primary osteoarthritis, left knee: Secondary | ICD-10-CM | POA: Insufficient documentation

## 2024-07-10 ENCOUNTER — Other Ambulatory Visit (HOSPITAL_BASED_OUTPATIENT_CLINIC_OR_DEPARTMENT_OTHER): Payer: Self-pay

## 2024-07-27 ENCOUNTER — Other Ambulatory Visit: Payer: Self-pay | Admitting: Family Medicine

## 2024-07-29 ENCOUNTER — Other Ambulatory Visit (HOSPITAL_BASED_OUTPATIENT_CLINIC_OR_DEPARTMENT_OTHER): Payer: Self-pay

## 2024-08-01 ENCOUNTER — Other Ambulatory Visit (HOSPITAL_BASED_OUTPATIENT_CLINIC_OR_DEPARTMENT_OTHER): Payer: Self-pay

## 2024-08-01 DIAGNOSIS — M1712 Unilateral primary osteoarthritis, left knee: Secondary | ICD-10-CM | POA: Diagnosis not present

## 2024-08-01 MED ORDER — CEPHALEXIN 250 MG PO CAPS
250.0000 mg | ORAL_CAPSULE | Freq: Every day | ORAL | 1 refills | Status: AC
  Filled 2024-08-01: qty 90, 90d supply, fill #0
  Filled 2024-10-25: qty 90, 90d supply, fill #1

## 2024-08-08 DIAGNOSIS — M1712 Unilateral primary osteoarthritis, left knee: Secondary | ICD-10-CM | POA: Diagnosis not present

## 2024-08-08 DIAGNOSIS — M25562 Pain in left knee: Secondary | ICD-10-CM | POA: Diagnosis not present

## 2024-08-09 ENCOUNTER — Other Ambulatory Visit: Payer: Self-pay | Admitting: Family Medicine

## 2024-08-09 ENCOUNTER — Encounter: Payer: Self-pay | Admitting: Family Medicine

## 2024-08-09 ENCOUNTER — Other Ambulatory Visit (HOSPITAL_BASED_OUTPATIENT_CLINIC_OR_DEPARTMENT_OTHER): Payer: Self-pay

## 2024-08-11 ENCOUNTER — Other Ambulatory Visit (HOSPITAL_BASED_OUTPATIENT_CLINIC_OR_DEPARTMENT_OTHER): Payer: Self-pay

## 2024-08-11 MED ORDER — MELOXICAM 15 MG PO TABS
15.0000 mg | ORAL_TABLET | Freq: Every day | ORAL | 0 refills | Status: DC
  Filled 2024-08-11: qty 30, 30d supply, fill #0

## 2024-08-11 NOTE — Telephone Encounter (Signed)
 Patient and husband is going abroad to [place] and is wondering if you recommend receiving a covid vaccine before their trip?

## 2024-08-13 ENCOUNTER — Other Ambulatory Visit (HOSPITAL_BASED_OUTPATIENT_CLINIC_OR_DEPARTMENT_OTHER): Payer: Self-pay

## 2024-08-13 MED ORDER — FLUZONE HIGH-DOSE 0.5 ML IM SUSY
0.5000 mL | PREFILLED_SYRINGE | Freq: Once | INTRAMUSCULAR | 0 refills | Status: AC
  Filled 2024-08-13: qty 0.5, 1d supply, fill #0

## 2024-08-15 ENCOUNTER — Encounter: Payer: Medicare HMO | Admitting: Family Medicine

## 2024-08-15 DIAGNOSIS — M25562 Pain in left knee: Secondary | ICD-10-CM | POA: Diagnosis not present

## 2024-08-15 DIAGNOSIS — M1712 Unilateral primary osteoarthritis, left knee: Secondary | ICD-10-CM | POA: Diagnosis not present

## 2024-08-18 ENCOUNTER — Encounter: Payer: Medicare HMO | Admitting: Family Medicine

## 2024-08-19 ENCOUNTER — Other Ambulatory Visit (HOSPITAL_BASED_OUTPATIENT_CLINIC_OR_DEPARTMENT_OTHER): Payer: Self-pay

## 2024-08-19 MED ORDER — COMIRNATY 30 MCG/0.3ML IM SUSY
0.3000 mL | PREFILLED_SYRINGE | Freq: Once | INTRAMUSCULAR | 0 refills | Status: AC
  Filled 2024-08-19: qty 0.3, 1d supply, fill #0

## 2024-08-21 DIAGNOSIS — H2513 Age-related nuclear cataract, bilateral: Secondary | ICD-10-CM | POA: Diagnosis not present

## 2024-08-21 DIAGNOSIS — H52223 Regular astigmatism, bilateral: Secondary | ICD-10-CM | POA: Diagnosis not present

## 2024-08-21 DIAGNOSIS — H524 Presbyopia: Secondary | ICD-10-CM | POA: Diagnosis not present

## 2024-08-21 DIAGNOSIS — H43393 Other vitreous opacities, bilateral: Secondary | ICD-10-CM | POA: Diagnosis not present

## 2024-08-21 DIAGNOSIS — H5213 Myopia, bilateral: Secondary | ICD-10-CM | POA: Diagnosis not present

## 2024-08-21 DIAGNOSIS — H25813 Combined forms of age-related cataract, bilateral: Secondary | ICD-10-CM | POA: Diagnosis not present

## 2024-08-29 ENCOUNTER — Telehealth: Payer: Self-pay | Admitting: Family Medicine

## 2024-08-29 NOTE — Telephone Encounter (Signed)
 Type of form received: Surgical Clearance  Additional comments: Patient has CPE on 10/29 rescheduled from the end of September  Received by: Fax  Form should be Faxed/mailed to: (address/ fax #) [phone]  Is patient requesting call for pickup: N/A  Form placed:  Labeled & placed in provider bin  Attach charge sheet.  Provider will determine charge.  Individual made aware of 3-5 business day turn around? N/A

## 2024-09-01 NOTE — Telephone Encounter (Signed)
 Obtained documents from front desk. Placed in providers folder for review

## 2024-09-09 ENCOUNTER — Other Ambulatory Visit (HOSPITAL_BASED_OUTPATIENT_CLINIC_OR_DEPARTMENT_OTHER): Payer: Self-pay

## 2024-09-09 MED FILL — Omeprazole Cap Delayed Release 20 MG: ORAL | 90 days supply | Qty: 90 | Fill #0 | Status: AC

## 2024-09-15 ENCOUNTER — Other Ambulatory Visit (HOSPITAL_BASED_OUTPATIENT_CLINIC_OR_DEPARTMENT_OTHER): Payer: Self-pay

## 2024-09-15 MED ORDER — NEOMYCIN-POLYMYXIN-DEXAMETH 3.5-10000-0.1 OP OINT
TOPICAL_OINTMENT | OPHTHALMIC | 0 refills | Status: AC
  Filled 2024-09-15: qty 3.5, 3d supply, fill #0

## 2024-09-17 ENCOUNTER — Ambulatory Visit (INDEPENDENT_AMBULATORY_CARE_PROVIDER_SITE_OTHER): Admitting: Family Medicine

## 2024-09-17 ENCOUNTER — Encounter: Payer: Self-pay | Admitting: Family Medicine

## 2024-09-17 VITALS — BP 118/82 | HR 78 | Temp 98.0°F | Ht 67.0 in | Wt 140.5 lb

## 2024-09-17 DIAGNOSIS — E785 Hyperlipidemia, unspecified: Secondary | ICD-10-CM

## 2024-09-17 DIAGNOSIS — Z0000 Encounter for general adult medical examination without abnormal findings: Secondary | ICD-10-CM

## 2024-09-17 NOTE — Telephone Encounter (Signed)
 Form completed and returned to [place]

## 2024-09-17 NOTE — Telephone Encounter (Signed)
 Faxed and placed in scan bin

## 2024-09-17 NOTE — Progress Notes (Signed)
   Subjective:    Patient ID: Elizabeth [name], female    DOB: July 13, 1941, 83 y.o.   MRN: [id number]  HPI CPE- UTD on mammo, flu  Health Maintenance  Topic Date Due   DTaP/Tdap/Td (1 - Tdap) 10/09/2013   Medicare Annual Wellness (AWV)  05/15/2024   Hepatitis C Screening  09/17/2024   COVID-19 Vaccine (7 - Pfizer risk 2025-26 season) 02/16/2025   Mammogram  03/20/2026   Pneumococcal Vaccine: 50+ Years  Completed   Influenza Vaccine  Completed   DEXA SCAN  Completed   Zoster Vaccines- Shingrix  Completed   Meningococcal B Vaccine  Aged Out    Patient Care Team    Relationship Specialty Notifications Start End  Mahlon Comer BRAVO, MD PCP - General Family Medicine  12/26/12   Estelle Service, MD Consulting Physician Obstetrics and Gynecology  03/01/15   Cyril Opal, MD  Neurosurgery  03/15/17   Magdalen Pasco RAMAN, DPM Consulting Physician Podiatry  03/15/17   Cleotilde Sewer, OD  Optometry  03/15/17   Curvin Deward MOULD, MD Consulting Physician General Surgery  04/03/17   Odean Potts, MD Consulting Physician Hematology and Oncology  04/03/17   Izell Domino, MD Attending Physician Radiation Oncology  04/03/17   Crawford Morna Pickle, NP Nurse Practitioner Hematology and Oncology  08/15/17   Abran Norleen SAILOR, MD Consulting Physician Gastroenterology  08/09/20       Review of Systems Patient reports no vision/ hearing changes, adenopathy,fever, weight change,  persistant/recurrent hoarseness , swallowing issues, chest pain, palpitations, edema, persistant/recurrent cough, hemoptysis, dyspnea (rest/exertional/paroxysmal nocturnal), gastrointestinal bleeding (melena, rectal bleeding), abdominal pain, significant heartburn, bowel changes, GU symptoms (dysuria, hematuria, incontinence), Gyn symptoms (abnormal  bleeding, pain),  syncope, focal weakness, memory loss, numbness & tingling, skin/hair/nail changes, abnormal bruising or bleeding, anxiety, or depression.     Objective:   Physical  Exam General Appearance:    Alert, cooperative, no distress, appears stated age  Head:    Normocephalic, without obvious abnormality, atraumatic  Eyes:    PERRL, conjunctiva/corneas clear, EOM's intact, fundi    benign, both eyes  Ears:    Normal TM's and external ear canals, both ears  Nose:   Nares normal, septum midline, mucosa normal, no drainage    or sinus tenderness  Throat:   Lips, mucosa, and tongue normal; teeth and gums normal  Neck:   Supple, symmetrical, trachea midline, no adenopathy;    Thyroid : no enlargement/tenderness/nodules  Back:     Symmetric, no curvature, ROM normal, no CVA tenderness  Lungs:     Clear to auscultation bilaterally, respirations unlabored  Chest Wall:    No tenderness or deformity   Heart:    Regular rate and rhythm, S1 and S2 normal, no murmur, rub   or gallop  Breast Exam:    Deferred to GYN  Abdomen:     Soft, non-tender, bowel sounds active all four quadrants,    no masses, no organomegaly  Genitalia:    Deferred to GYN  Rectal:    Extremities:   Extremities normal, atraumatic, no cyanosis or edema  Pulses:   2+ and symmetric all extremities  Skin:   Skin color, texture, turgor normal, no rashes or lesions  Lymph nodes:   Cervical, supraclavicular, and axillary nodes normal  Neurologic:   CNII-XII intact, normal strength, sensation and reflexes    throughout          Assessment & Plan:

## 2024-09-17 NOTE — Patient Instructions (Addendum)
Follow up in 1 year or as needed We'll notify you of your lab results and make any changes if needed Keep up the good work on healthy diet and regular exercise- you look great! Call with any questions or concerns Stay Safe! Stay Healthy! Happy Holidays!!! 

## 2024-09-17 NOTE — Assessment & Plan Note (Signed)
 Pt's PE WNL.  UTD on mammo, flu.  Check labs.  Anticipatory guidance provided.

## 2024-09-18 ENCOUNTER — Ambulatory Visit: Payer: Self-pay | Admitting: Family Medicine

## 2024-09-18 LAB — BASIC METABOLIC PANEL WITH GFR
BUN: 20 mg/dL (ref 6–23)
CO2: 26 meq/L (ref 19–32)
Calcium: 9.9 mg/dL (ref 8.4–10.5)
Chloride: 98 meq/L (ref 96–112)
Creatinine, Ser: 0.88 mg/dL (ref 0.40–1.20)
GFR: 61.49 mL/min (ref >60.00)
Glucose, Bld: 78 mg/dL (ref 70–99)
Potassium: 4.5 meq/L (ref 3.5–5.1)
Sodium: 132 meq/L — ABNORMAL LOW (ref 135–145)

## 2024-09-18 LAB — CBC WITH DIFFERENTIAL/PLATELET
Basophils Absolute: 0 K/uL (ref 0.0–0.1)
Basophils Relative: 0.8 % (ref 0.0–3.0)
Eosinophils Absolute: 0.1 K/uL (ref 0.0–0.7)
Eosinophils Relative: 1.3 % (ref 0.0–5.0)
HCT: 36.3 % (ref 36.0–46.0)
Hemoglobin: 12.5 g/dL (ref 12.0–15.0)
Lymphocytes Relative: 16.3 % (ref 12.0–46.0)
Lymphs Abs: 1.1 K/uL (ref 0.7–4.0)
MCHC: 34.4 g/dL (ref 30.0–36.0)
MCV: 97.6 fl (ref 78.0–100.0)
Monocytes Absolute: 0.6 K/uL (ref 0.1–1.0)
Monocytes Relative: 8.7 % (ref 3.0–12.0)
Neutro Abs: 4.7 K/uL (ref 1.4–7.7)
Neutrophils Relative %: 72.9 % (ref 43.0–77.0)
Platelets: 321 K/uL (ref 150.0–400.0)
RBC: 3.72 Mil/uL — ABNORMAL LOW (ref 3.87–5.11)
RDW: 12.2 % (ref 11.5–15.5)
WBC: 6.5 K/uL (ref 4.0–10.5)

## 2024-09-18 LAB — HEPATIC FUNCTION PANEL
ALT: 14 U/L (ref 0–35)
AST: 21 U/L (ref 0–37)
Albumin: 4.5 g/dL (ref 3.5–5.2)
Alkaline Phosphatase: 66 U/L (ref 39–117)
Bilirubin, Direct: 0.1 mg/dL (ref 0.0–0.3)
Total Bilirubin: 0.5 mg/dL (ref 0.2–1.2)
Total Protein: 8 g/dL (ref 6.0–8.3)

## 2024-09-18 LAB — LIPID PANEL
Cholesterol: 216 mg/dL — ABNORMAL HIGH (ref 0–200)
HDL: 79.8 mg/dL (ref >39.00)
LDL Cholesterol: 118 mg/dL — ABNORMAL HIGH (ref 0–99)
NonHDL: 135.74
Total CHOL/HDL Ratio: 3
Triglycerides: 91 mg/dL (ref 0.0–149.0)
VLDL: 18.2 mg/dL (ref 0.0–40.0)

## 2024-09-18 LAB — TSH: TSH: 1.94 u[IU]/mL (ref 0.35–5.50)

## 2024-09-22 DIAGNOSIS — H0279 Other degenerative disorders of eyelid and periocular area: Secondary | ICD-10-CM | POA: Diagnosis not present

## 2024-09-22 DIAGNOSIS — H53483 Generalized contraction of visual field, bilateral: Secondary | ICD-10-CM | POA: Diagnosis not present

## 2024-09-22 DIAGNOSIS — H53453 Other localized visual field defect, bilateral: Secondary | ICD-10-CM | POA: Diagnosis not present

## 2024-09-22 DIAGNOSIS — H02422 Myogenic ptosis of left eyelid: Secondary | ICD-10-CM | POA: Diagnosis not present

## 2024-09-22 DIAGNOSIS — H02834 Dermatochalasis of left upper eyelid: Secondary | ICD-10-CM | POA: Diagnosis not present

## 2024-09-22 DIAGNOSIS — H02412 Mechanical ptosis of left eyelid: Secondary | ICD-10-CM | POA: Diagnosis not present

## 2024-09-22 DIAGNOSIS — H57813 Brow ptosis, bilateral: Secondary | ICD-10-CM | POA: Diagnosis not present

## 2024-09-22 DIAGNOSIS — H02423 Myogenic ptosis of bilateral eyelids: Secondary | ICD-10-CM | POA: Diagnosis not present

## 2024-09-22 DIAGNOSIS — Z01818 Encounter for other preprocedural examination: Secondary | ICD-10-CM | POA: Diagnosis not present

## 2024-09-22 DIAGNOSIS — H02831 Dermatochalasis of right upper eyelid: Secondary | ICD-10-CM | POA: Diagnosis not present

## 2024-09-22 DIAGNOSIS — H02411 Mechanical ptosis of right eyelid: Secondary | ICD-10-CM | POA: Diagnosis not present

## 2024-09-22 DIAGNOSIS — H02421 Myogenic ptosis of right eyelid: Secondary | ICD-10-CM | POA: Diagnosis not present

## 2024-09-22 DIAGNOSIS — H02413 Mechanical ptosis of bilateral eyelids: Secondary | ICD-10-CM | POA: Diagnosis not present

## 2024-10-02 DIAGNOSIS — M1712 Unilateral primary osteoarthritis, left knee: Secondary | ICD-10-CM | POA: Diagnosis not present

## 2024-10-21 ENCOUNTER — Ambulatory Visit

## 2024-10-21 VITALS — Ht 67.0 in | Wt 140.0 lb

## 2024-10-21 DIAGNOSIS — Z0000 Encounter for general adult medical examination without abnormal findings: Secondary | ICD-10-CM | POA: Diagnosis not present

## 2024-10-21 NOTE — Progress Notes (Signed)
 Chief Complaint  Patient presents with   Medicare Wellness     Subjective:   Elizabeth [name] is a 83 y.o. female who presents for a Medicare Annual Wellness Visit.  I connected with  Elizabeth [name] on 10/21/24 by a audio enabled telemedicine application and verified that I am speaking with the correct person using two identifiers.  Patient Location: Home  Provider Location: Office/Clinic  Persons Participating in Visit: Patient.  I discussed the limitations of evaluation and management by telemedicine. The patient expressed understanding and agreed to proceed.  Vital Signs: Because this visit was a virtual/telehealth visit, some criteria may be missing or patient reported. Any vitals not documented were not able to be obtained and vitals that have been documented are patient reported.   Visit info / Clinical Intake: Medicare Wellness Visit Type:: Subsequent Annual Wellness Visit Persons participating in visit and providing information:: patient Medicare Wellness Visit Mode:: Telephone If telephone:: video declined Since this visit was completed virtually, some vitals may be partially provided or unavailable. Missing vitals are due to the limitations of the virtual format.: Documented vitals are patient reported If Telephone or Video please confirm:: I connected with patient using audio/video enable telemedicine. I verified patient identity with two identifiers, discussed telehealth limitations, and patient agreed to proceed. Patient Location:: Home Provider Location:: Office Interpreter Needed?: No Pre-visit prep was completed: yes AWV questionnaire completed by patient prior to visit?: yes Date:: 10/20/24 Living arrangements:: lives with spouse/significant other Patient's Overall Health Status Rating: very good Typical amount of pain: (!) a lot (left knee) Does pain affect daily life?: (!) yes Are you currently prescribed opioids?: no  Dietary Habits and  Nutritional Risks How many meals a day?: 3 Eats fruit and vegetables daily?: yes Most meals are obtained by: preparing own meals; eating out; having others provide food In the last 2 weeks, have you had any of the following?: none Diabetic:: no  Functional Status Activities of Daily Living (to include ambulation/medication): Independent Ambulation: Independent with device- listed below Home Assistive Devices/Equipment: Eyeglasses; Contact lenses; Walker (specify Type); Dentures (specify type); [place] (wears hearing aids) Medication Administration: Independent Home Management (perform basic housework or laundry): Independent Manage your own finances?: yes Primary transportation is: driving Concerns about vision?: no *vision screening is required for WTM* Concerns about hearing?: (!) yes Uses hearing aids?: (!) yes Hear whispered voice?: yes  Fall Screening Falls in the past year?: 0 Number of falls in past year: 0 Was there an injury with Fall?: 0 Fall Risk Category Calculator: 0 Patient Fall Risk Level: Low Fall Risk  Fall Risk Patient at Risk for Falls Due to: No Fall Risks Fall risk Follow up: Falls evaluation completed; Falls prevention discussed  Home and Transportation Safety: All rugs have non-skid backing?: yes All stairs or steps have railings?: N/A, no stairs Grab bars in the bathtub or shower?: yes Have non-skid surface in bathtub or shower?: yes Good home lighting?: yes Regular seat belt use?: yes Hospital stays in the last year:: no  Cognitive Assessment Difficulty concentrating, remembering, or making decisions? : no Will 6CIT or Mini Cog be Completed: yes What year is it?: 0 points What month is it?: 0 points Give patient an address phrase to remember (5 components): [address] [place], [place] About what time is it?: 0 points Count backwards from 20 to 1: 0 points Say the months of the year in reverse: 0 points Repeat the address phrase from earlier: 0  points  6 CIT Score: 0 points  Advance Directives (For Healthcare) Does Patient Have a Medical Advance Directive?: Yes Does patient want to make changes to medical advance directive?: Yes (Inpatient - patient requests chaplain consult to change a medical advance directive) Type of Advance Directive: Healthcare Power of [place]; Living will Copy of Healthcare Power of Attorney in Chart?: Yes - validated most recent copy scanned in chart (See row information) Copy of Living Will in Chart?: Yes - validated most recent copy scanned in chart (See row information)  Reviewed/Updated  Reviewed/Updated: Reviewed All (Medical, Surgical, Family, Medications, Allergies, Care Teams, Patient Goals)    Allergies (verified) Patient has no known allergies.   Current Medications (verified) Outpatient Encounter Medications as of 10/21/2024  Medication Sig   Biotin (BIOTIN 5000) 5 MG CAPS Take by mouth.   cephALEXin  (KEFLEX ) 250 MG capsule Take 250 mg by mouth daily.   cetirizine (ZYRTEC) 10 MG tablet Take 10 mg by mouth daily.   estradiol (ESTRACE) 0.1 MG/GM vaginal cream Place vaginally 3 (three) times a week.   gabapentin  (NEURONTIN ) 600 MG tablet TAKE 1 TABLET BY MOUTH THREE TIMES A DAY   meloxicam  (MOBIC ) 15 MG tablet Take 1 tablet (15 mg total) by mouth daily.   methenamine  (HIPREX ) 1 g tablet Take 1 tablet (1 g total) by mouth daily.   methocarbamol  (ROBAXIN ) 500 MG tablet Take 1-2 tablets (500-1,000 mg total) by mouth 4 (four) times daily as needed for muscle spasms.   montelukast  (SINGULAIR ) 10 MG tablet Take 1 tablet (10 mg total) by mouth at bedtime.   Multiple Vitamin (MULTIVITAMIN) capsule Take 1 capsule by mouth daily.   omeprazole  (PRILOSEC) 20 MG capsule Take 1 capsule (20 mg total) by mouth daily.   phenazopyridine  (PYRIDIUM ) 200 MG tablet Take 1 tablet (200 mg total) by mouth 3 (three) times daily.   Probiotic Product (ADVANCED PROBIOTIC 10) CAPS Take 1 capsule by mouth daily.    cephALEXin  (KEFLEX ) 250 MG capsule Take 1 capsule (250 mg total) by mouth daily. (Patient not taking: Reported on 10/21/2024)   loratadine (CLARITIN) 10 MG tablet Take 10 mg by mouth daily. (Patient not taking: Reported on 10/21/2024)   neomycin -polymyxin b-dexamethasone  (MAXITROL ) 3.5-10000-0.1 OINT APPLY A SMALL AMOUNT ONTO SUTURES OR OPERATIVE SITE TWICE A DAY FOR THREE DAYS THEN STOP   No facility-administered encounter medications on file as of 10/21/2024.    History: Past Medical History:  Diagnosis Date   Arthritis    Cancer (HCC)    left breast cancer   Chicken pox    GERD (gastroesophageal reflux disease)    History of radiation therapy 06/26/17- 07/24/17   Left Breast 50.05 Gy total   Personal history of radiation therapy    Shingles 07/2002   Sleep apnea    mild uses oral devise   Urinary tract infection    Past Surgical History:  Procedure Laterality Date   BREAST BIOPSY Bilateral 03/07/2017   benign   BREAST BIOPSY Right 03/26/2017   benign   BREAST BIOPSY Left 03/26/2017   malignant   BREAST LUMPECTOMY Left 05/03/2017   BREAST LUMPECTOMY WITH RADIOACTIVE SEED LOCALIZATION Left 05/03/2017   Procedure: LEFT BREAST LUMPECTOMY WITH RADIOACTIVE SEED LOCALIZATION;  Surgeon: Curvin Deward MOULD, MD;  Location: [facility] OR;  Service: General;  Laterality: Left;   COLONOSCOPY     CYST EXCISION Right 03/10/2022   Procedure: EXCISION SEBACEOUS CYST RIGHT HIP;  Surgeon: Curvin Deward MOULD, MD;  Location: [facility];  Service: General;  Laterality: Right;   HYSTEROSCOPY     Dr Cary   SPINAL FUSION  12/01/2016   T10 to Pelvis   TOTAL HIP ARTHROPLASTY Left 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/2021   Procedure: TOTAL HIP ARTHROPLASTY ANTERIOR APPROACH;  Surgeon: Melodi Lerner, MD;  Location: [facility] ORS;  Service: Orthopedics;  Laterality: Left;   TOTAL HIP ARTHROPLASTY Right 07/05/2022   Procedure: TOTAL HIP ARTHROPLASTY ANTERIOR APPROACH;  Surgeon: Melodi Lerner, MD;  Location: [facility] ORS;  Service: Orthopedics;   Laterality: Right;   UPPER GASTROINTESTINAL ENDOSCOPY  11/30/2020   WISDOM TOOTH EXTRACTION     age 89's   Family History  Problem Relation Age of Onset   Diabetes Mother    Arthritis Father    Cancer Maternal Aunt        liver   Heart disease Paternal Grandfather    Prostate cancer Brother    Colon cancer Neg Hx    Pancreatic cancer Neg Hx    Stomach cancer Neg Hx    Social History   Occupational History   Occupation: retired  Tobacco Use   Smoking status: Former    Current packs/day: 0.00    Types: Cigarettes    Quit date: 02/18/1981    Years since quitting: 43.7   Smokeless tobacco: Never  Vaping Use   Vaping status: Never Used  Substance and Sexual Activity   Alcohol use: Yes    Alcohol/week: 2.0 standard drinks of alcohol    Types: 2 Glasses of wine per week    Comment: glass of wine daily   Drug use: No   Sexual activity: Yes   Tobacco Counseling Counseling given: Yes  SDOH Screenings   Food Insecurity: No Food Insecurity (10/21/2024)  Housing: Low Risk  (10/21/2024)  Transportation Needs: No Transportation Needs (10/21/2024)  Utilities: Not At Risk (10/21/2024)  Alcohol Screen: Low Risk  (09/15/2024)  Depression (PHQ2-9): Low Risk  (10/21/2024)  Financial Resource Strain: Low Risk  (09/15/2024)  Physical Activity: Insufficiently Active (10/21/2024)  Social Connections: Socially Integrated (10/21/2024)  Stress: No Stress Concern Present (10/21/2024)  Tobacco Use: Medium Risk (10/21/2024)  Health Literacy: Adequate Health Literacy (10/21/2024)   See flowsheets for full screening details  Depression Screen PHQ 2 & 9 Depression Scale- Over the past 2 weeks, how often have you been bothered by any of the following problems? Little interest or pleasure in doing things: 0 Feeling down, depressed, or hopeless (PHQ Adolescent also includes...irritable): 0 PHQ-2 Total Score: 0 Trouble falling or staying asleep, or sleeping too much: 1 Feeling tired or having little  energy: 0 Poor appetite or overeating (PHQ Adolescent also includes...weight loss): 0 Feeling bad about yourself - or that you are a failure or have let yourself or your family down: 0 Trouble concentrating on things, such as reading the newspaper or watching television (PHQ Adolescent also includes...like school work): 0 Moving or speaking so slowly that other people could have noticed. Or the opposite - being so fidgety or restless that you have been moving around a lot more than usual: 3 (bilateral knee discomfort) Thoughts that you would be better off dead, or of hurting yourself in some way: 0 PHQ-9 Total Score: 4 If you checked off any problems, how difficult have these problems made it for you to do your work, take care of things at home, or get along with other people?: Not difficult at all  Depression Treatment Depression Interventions/Treatment : EYV7-0 Score <4 Follow-up Not Indicated     Goals Addressed  This Visit's Progress     Patient Stated (pt-stated)        Patient stated she plans to continue being active - manage the left knee discomfort             Objective:    Today's Vitals   10/21/24 1427  Weight: 140 lb (63.5 kg)  Height: 5' 7 (1.702 m)   Body mass index is 21.93 kg/m.  Hearing/Vision screen Hearing Screening - Comments:: Wears hearing aids Vision Screening - Comments:: Wears rx glasses and contact lenses - up to date with routine eye exams with Ginnie Pinal at [facility] Immunizations and Health Maintenance Health Maintenance  Topic Date Due   DTaP/Tdap/Td (1 - Tdap) 10/09/2013   Hepatitis C Screening  09/17/2024   COVID-19 Vaccine (7 - Pfizer risk 2025-26 season) 02/16/2025   Medicare Annual Wellness (AWV)  10/21/2025   Mammogram  03/20/2026   Pneumococcal Vaccine: 50+ Years  Completed   Influenza Vaccine  Completed   Bone Density Scan  Completed   Zoster Vaccines- Shingrix  Completed   Meningococcal B Vaccine  Aged  Out        Assessment/Plan:  This is a routine wellness examination for Jareli.  Patient Care Team: Mahlon Comer BRAVO, MD as PCP - General (Family Medicine) Estelle Service, MD as Consulting Physician (Obstetrics and Gynecology) Cyril Opal, MD (Neurosurgery) Regal, [place] RAMAN, DPM as Consulting Physician (Podiatry) Pinal Ginnie, OD (Optometry) Curvin Deward MOULD, MD as Consulting Physician (General Surgery) Odean Potts, MD as Consulting Physician (Hematology and Oncology) Izell Domino, MD as Attending Physician (Radiation Oncology) Crawford Morna Pickle, NP as Nurse Practitioner (Hematology and Oncology) Abran Norleen SAILOR, MD as Consulting Physician (Gastroenterology)  I have personally reviewed and noted the following in the patient's chart:   Medical and social history Use of alcohol, tobacco or illicit drugs  Current medications and supplements including opioid prescriptions. Functional ability and status Nutritional status Physical activity Advanced directives List of other physicians Hospitalizations, surgeries, and ER visits in previous 12 months Vitals Screenings to include cognitive, depression, and falls Referrals and appointments  No orders of the defined types were placed in this encounter.  In addition, I have reviewed and discussed with patient certain preventive protocols, quality metrics, and best practice recommendations. A written personalized care plan for preventive services as well as general preventive health recommendations were provided to patient.   Verdie CHRISTELLA Saba, CMA   10/21/2024   Return in 1 year (on 10/21/2025).  After Visit Summary: (MyChart) Due to this being a telephonic visit, the after visit summary with patients personalized plan was offered to patient via MyChart   Nurse Notes: Scheduled 2026 AWV/CPE appts

## 2024-10-21 NOTE — Patient Instructions (Addendum)
 Elizabeth [name],  Thank you for taking the time for your Medicare Wellness Visit. I appreciate your continued commitment to your health goals. Please review the care plan we discussed, and feel free to reach out if I can assist you further.  Please note that Annual Wellness Visits do not include a physical exam. Some assessments may be limited, especially if the visit was conducted virtually. If needed, we may recommend an in-person follow-up with your provider.  Ongoing Care Seeing your primary care provider every 3 to 6 months helps us  monitor your health and provide consistent, personalized care.   Referrals If a referral was made during today's visit and you haven't received any updates within two weeks, please contact the referred provider directly to check on the status.  Recommended Screenings:  Health Maintenance  Topic Date Due   DTaP/Tdap/Td vaccine (1 - Tdap) 10/09/2013   Hepatitis C Screening  09/17/2024   COVID-19 Vaccine (7 - Pfizer risk 2025-26 season) 02/16/2025   Medicare Annual Wellness Visit  10/21/2025   Breast Cancer Screening  03/20/2026   Pneumococcal Vaccine for age over 91  Completed   Flu Shot  Completed   Osteoporosis screening with Bone Density Scan  Completed   Zoster (Shingles) Vaccine  Completed   Meningitis B Vaccine  Aged Out       10/21/2024    2:30 PM  Advanced Directives  Does Patient Have a Medical Advance Directive? Yes  Type of Estate Agent of [place];Living will  Does patient want to make changes to medical advance directive? Yes (Inpatient - patient requests chaplain consult to change a medical advance directive)  Copy of Healthcare Power of Attorney in Chart? Yes - validated most recent copy scanned in chart (See row information)    Vision: Annual vision screenings are recommended for early detection of glaucoma, cataracts, and diabetic retinopathy. These exams can also reveal signs of chronic conditions such as  diabetes and high blood pressure.  Dental: Annual dental screenings help detect early signs of oral cancer, gum disease, and other conditions linked to overall health, including heart disease and diabetes.

## 2024-10-25 ENCOUNTER — Other Ambulatory Visit: Payer: Self-pay | Admitting: Family Medicine

## 2024-10-27 ENCOUNTER — Other Ambulatory Visit: Payer: Self-pay

## 2024-10-27 ENCOUNTER — Other Ambulatory Visit (HOSPITAL_BASED_OUTPATIENT_CLINIC_OR_DEPARTMENT_OTHER): Payer: Self-pay

## 2024-10-27 MED ORDER — MELOXICAM 15 MG PO TABS
15.0000 mg | ORAL_TABLET | Freq: Every day | ORAL | 0 refills | Status: AC
  Filled 2024-10-27: qty 30, 30d supply, fill #0

## 2024-10-27 NOTE — Telephone Encounter (Signed)
 Requested Prescriptions   Pending Prescriptions Disp Refills   meloxicam  (MOBIC ) 15 MG tablet 30 tablet 0    Sig: Take 1 tablet (15 mg total) by mouth daily.     Date of patient request: 10/27/2024 Last office visit: 09/17/2024 Upcoming visit: Visit date not found Date of last refill: 08/11/2024 Last refill amount: 30

## 2024-10-28 ENCOUNTER — Other Ambulatory Visit (HOSPITAL_BASED_OUTPATIENT_CLINIC_OR_DEPARTMENT_OTHER): Payer: Self-pay

## 2024-10-28 ENCOUNTER — Other Ambulatory Visit: Payer: Self-pay

## 2024-10-28 ENCOUNTER — Encounter: Payer: Self-pay | Admitting: Family Medicine

## 2024-10-28 MED ORDER — GABAPENTIN 600 MG PO TABS
600.0000 mg | ORAL_TABLET | Freq: Three times a day (TID) | ORAL | 0 refills | Status: AC
  Filled 2024-10-28: qty 270, 90d supply, fill #0

## 2024-10-30 DIAGNOSIS — M1712 Unilateral primary osteoarthritis, left knee: Secondary | ICD-10-CM | POA: Diagnosis not present

## 2024-11-03 ENCOUNTER — Telehealth: Payer: Self-pay | Admitting: Family Medicine

## 2024-11-03 NOTE — Telephone Encounter (Signed)
 Patient has surgical clearance forms upfront in Nurse folder. Patient was last seen by Dr.Tabori on 09/17/2024 for a physical.

## 2024-11-03 NOTE — Telephone Encounter (Addendum)
 Type of form received: Preoperative Clearance Form   Additional comments: Patient had physical on 09/17/24  Received by: Fax  Form should be Faxed/mailed to: (address/ fax #) [phone]  Is patient requesting call for pickup:  Form placed:  provider bin  Attach charge sheet.  Provider will determine charge.  Individual made aware of 3-5 business day turn around No?

## 2024-11-04 NOTE — Telephone Encounter (Signed)
 Reviewed paperwork and it looks like we do not need to order any testing prior to surgery. I have placed forms in your folder at nurse station to review. If patient needs appt, I will be glad to schedule.

## 2024-11-06 NOTE — Telephone Encounter (Signed)
 Pt has been scheduled for EKG nurse visit

## 2024-11-06 NOTE — Telephone Encounter (Signed)
 Needs nurse visit for EKG but is otherwise good to go

## 2024-11-16 ENCOUNTER — Other Ambulatory Visit (HOSPITAL_BASED_OUTPATIENT_CLINIC_OR_DEPARTMENT_OTHER): Payer: Self-pay

## 2024-11-17 ENCOUNTER — Other Ambulatory Visit: Payer: Self-pay

## 2024-11-17 ENCOUNTER — Other Ambulatory Visit (HOSPITAL_BASED_OUTPATIENT_CLINIC_OR_DEPARTMENT_OTHER): Payer: Self-pay

## 2024-11-17 MED ORDER — METHENAMINE HIPPURATE 1 G PO TABS
1.0000 g | ORAL_TABLET | Freq: Every day | ORAL | 3 refills | Status: AC
  Filled 2024-11-17: qty 90, 90d supply, fill #0

## 2024-11-18 ENCOUNTER — Other Ambulatory Visit (HOSPITAL_BASED_OUTPATIENT_CLINIC_OR_DEPARTMENT_OTHER): Payer: Self-pay

## 2024-11-18 ENCOUNTER — Ambulatory Visit (INDEPENDENT_AMBULATORY_CARE_PROVIDER_SITE_OTHER)

## 2024-11-18 DIAGNOSIS — Z01818 Encounter for other preprocedural examination: Secondary | ICD-10-CM | POA: Diagnosis not present

## 2024-11-18 NOTE — Progress Notes (Signed)
 Patient is in office today for a nurse visit for EK for surgery. Patient tolerated well. EKG in providers folder for review at nurse station

## 2024-11-28 ENCOUNTER — Other Ambulatory Visit: Payer: Self-pay | Admitting: Internal Medicine

## 2024-11-28 ENCOUNTER — Other Ambulatory Visit (HOSPITAL_BASED_OUTPATIENT_CLINIC_OR_DEPARTMENT_OTHER): Payer: Self-pay

## 2024-11-28 DIAGNOSIS — R1310 Dysphagia, unspecified: Secondary | ICD-10-CM

## 2024-11-28 MED ORDER — OMEPRAZOLE 20 MG PO CPDR
20.0000 mg | DELAYED_RELEASE_CAPSULE | Freq: Every day | ORAL | 3 refills | Status: AC
  Filled 2024-11-28: qty 90, 90d supply, fill #0

## 2024-12-02 NOTE — Telephone Encounter (Signed)
 Forms were faxed to [facility]

## 2024-12-09 NOTE — H&P (Signed)
 " TOTAL KNEE ADMISSION H&P  Patient is being admitted for left total knee arthroplasty.  Subjective:  Chief Complaint: Left knee pain.  HPI: Elizabeth [name], 84 y.o. female has a history of pain and functional disability in the left knee due to arthritis and has failed non-surgical conservative treatments for greater than 12 weeks to include NSAID's and/or analgesics, corticosteriod injections, viscosupplementation injections, flexibility and strengthening excercises, use of assistive devices, and activity modification. Onset of symptoms was gradual, starting several years ago with gradually worsening course since that time. The patient noted no past surgery on the left knee.  Patient currently rates pain in the left knee at 8 out of 10 with activity. Patient has night pain, worsening of pain with activity and weight bearing, pain that interferes with activities of daily living, and crepitus. Patient has evidence of bone-on-bone arthritis in the patellofemoral compartment and near bone-on-bone arthritis in the medial compartment of the left knee. by imaging studies. There is no active infection.  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   Hx of breast cancer 08/12/2021   Dysphagia 11/02/2020   Sensorineural hearing loss (SNHL) of both ears 06/24/2019   S/P lumbar spinal fusion 12/01/2016   Lumbar back pain 12/23/2015   Routine general medical examination at a health care facility 10/08/2013   Chronic back pain 12/26/2012    Past Medical History:  Diagnosis Date   Arthritis    Cancer (HCC)    left breast cancer   Chicken pox    GERD (gastroesophageal reflux disease)    History of radiation therapy 06/26/17- 07/24/17   Left Breast 50.05 Gy total   Personal history of radiation therapy     Shingles 07/2002   Sleep apnea    mild uses oral devise   Urinary tract infection     Past Surgical History:  Procedure Laterality Date   BREAST BIOPSY Bilateral 03/07/2017   benign   BREAST BIOPSY Right 03/26/2017   benign   BREAST BIOPSY Left 03/26/2017   malignant   BREAST LUMPECTOMY Left 05/03/2017   BREAST LUMPECTOMY WITH RADIOACTIVE SEED LOCALIZATION Left 05/03/2017   Procedure: LEFT BREAST LUMPECTOMY WITH RADIOACTIVE SEED LOCALIZATION;  Surgeon: Curvin Deward MOULD, MD;  Location: [facility] OR;  Service: General;  Laterality: Left;   COLONOSCOPY     CYST EXCISION Right 03/10/2022   Procedure: EXCISION SEBACEOUS CYST RIGHT HIP;  Surgeon: Curvin Deward MOULD, MD;  Location: [facility];  Service: General;  Laterality: Right;   HYSTEROSCOPY     Dr Cary   SPINAL FUSION  12/01/2016   T10 to Pelvis   TOTAL HIP ARTHROPLASTY Left 12/21/2021   Procedure: TOTAL HIP ARTHROPLASTY ANTERIOR APPROACH;  Surgeon: Melodi Lerner, MD;  Location: [facility];  Service: Orthopedics;  Laterality: Left;   TOTAL HIP ARTHROPLASTY Right 07/05/2022   Procedure: TOTAL HIP ARTHROPLASTY ANTERIOR APPROACH;  Surgeon: Melodi Lerner, MD;  Location: [facility];  Service: Orthopedics;  Laterality: Right;   UPPER GASTROINTESTINAL ENDOSCOPY  11/30/2020   WISDOM TOOTH EXTRACTION     age 57's    Prior to Admission medications  Medication Sig Start Date End Date Taking? Authorizing Provider  Biotin (BIOTIN 5000) 5 MG CAPS Take by mouth.    [provider]  cephALEXin  (KEFLEX ) 250 MG capsule  Take 250 mg by mouth daily. 08/08/23   [provider]  cephALEXin  (KEFLEX ) 250 MG capsule Take 1 capsule (250 mg total) by mouth daily. Patient not taking: Reported on 10/21/2024 08/01/24     cetirizine (ZYRTEC) 10 MG tablet Take 10 mg by mouth daily.    [provider]  estradiol (ESTRACE) 0.1 MG/GM vaginal cream Place vaginally 3 (three) times a week.    [provider]  gabapentin  (NEURONTIN ) 600 MG  tablet Take 1 tablet (600 mg total) by mouth 3 (three) times daily. 10/28/24   Tabori, Katherine E, MD  loratadine (CLARITIN) 10 MG tablet Take 10 mg by mouth daily. Patient not taking: Reported on 10/21/2024    [provider]  meloxicam  (MOBIC ) 15 MG tablet Take 1 tablet (15 mg total) by mouth daily. 10/27/24   Tabori, Katherine E, MD  methenamine  (HIPREX ) 1 g tablet Take 1 tablet (1 g total) by mouth daily. 11/17/24     methocarbamol  (ROBAXIN ) 500 MG tablet Take 1-2 tablets (500-1,000 mg total) by mouth 4 (four) times daily as needed for muscle spasms. 02/21/23   Mahlon Comer BRAVO, MD  montelukast  (SINGULAIR ) 10 MG tablet Take 1 tablet (10 mg total) by mouth at bedtime. 08/17/23   Tabori, Katherine E, MD  Multiple Vitamin (MULTIVITAMIN) capsule Take 1 capsule by mouth daily.    [provider]  neomycin -polymyxin b-dexamethasone  (MAXITROL ) 3.5-10000-0.1 OINT APPLY A SMALL AMOUNT ONTO SUTURES OR OPERATIVE SITE TWICE A DAY FOR THREE DAYS THEN STOP 09/15/24     omeprazole  (PRILOSEC) 20 MG capsule Take 1 capsule (20 mg total) by mouth daily. Office visit for further refills 11/28/24   Abran Norleen SAILOR, MD  phenazopyridine  (PYRIDIUM ) 200 MG tablet Take 1 tablet (200 mg total) by mouth 3 (three) times daily. 08/28/23     Probiotic Product (ADVANCED PROBIOTIC 10) CAPS Take 1 capsule by mouth daily.    [provider]    Allergies[1]  Social History   Socioeconomic History   Marital status: Married    Spouse name: Not on file   Number of children: 2   Years of education: Not on file   Highest education level: Bachelor's degree (e.g., BA, AB, BS)  Occupational History   Occupation: retired  Tobacco Use   Smoking status: Former    Current packs/day: 0.00    Types: Cigarettes    Quit date: 02/18/1981    Years since quitting: 43.8   Smokeless tobacco: Never  Vaping Use   Vaping status: Never Used  Substance and Sexual Activity   Alcohol use: Yes    Alcohol/week: 2.0  standard drinks of alcohol    Types: 2 Glasses of wine per week    Comment: glass of wine daily   Drug use: No   Sexual activity: Yes  Other Topics Concern   Not on file  Social History Narrative   ** Merged History Encounter **       Social Drivers of Health   Tobacco Use: Medium Risk (10/21/2024)   Patient History    Smoking Tobacco Use: Former    Smokeless Tobacco Use: Never    Passive Exposure: Not on file  Financial Resource Strain: Low Risk (09/15/2024)   Overall Financial Resource Strain (CARDIA)    Difficulty of Paying Living Expenses: Not hard at all  Food Insecurity: No Food Insecurity (10/21/2024)   Epic    Worried About Running Out of Food in the Last Year: Never true    Ran Out  of Food in the Last Year: Never true  Transportation Needs: No Transportation Needs (10/21/2024)   Epic    Lack of Transportation (Medical): No    Lack of Transportation (Non-Medical): No  Physical Activity: Insufficiently Active (10/21/2024)   Exercise Vital Sign    Days of Exercise per Week: 3 days    Minutes of Exercise per Session: 30 min  Stress: No Stress Concern Present (10/21/2024)   [employer] of Occupational Health - Occupational Stress Questionnaire    Feeling of Stress: Not at all  Social Connections: Socially Integrated (10/21/2024)   Social Connection and Isolation Panel    Frequency of Communication with Friends and Family: More than three times a week    Frequency of Social Gatherings with Friends and Family: Twice a week    Attends Religious Services: More than 4 times per year    Active Member of Clubs or Organizations: Yes    Attends Banker Meetings: More than 4 times per year    Marital Status: Married  Catering Manager Violence: Not At Risk (10/21/2024)   Epic    Fear of Current or Ex-Partner: No    Emotionally Abused: No    Physically Abused: No    Sexually Abused: No  Depression (PHQ2-9): Low Risk (10/21/2024)   Depression (PHQ2-9)    PHQ-2  Score: 4  Alcohol Screen: Low Risk (09/15/2024)   Alcohol Screen    Last Alcohol Screening Score (AUDIT): 3  Housing: Low Risk (10/21/2024)   Epic    Unable to Pay for Housing in the Last Year: No    Number of Times Moved in the Last Year: 0    Homeless in the Last Year: No  Utilities: Not At Risk (10/21/2024)   Epic    Threatened with loss of utilities: No  Health Literacy: Adequate Health Literacy (10/21/2024)   B1300 Health Literacy    Frequency of need for help with medical instructions: Never    Tobacco Use: Medium Risk (10/21/2024)   Patient History    Smoking Tobacco Use: Former    Smokeless Tobacco Use: Never    Passive Exposure: Not on file   Social History   Substance and Sexual Activity  Alcohol Use Yes   Alcohol/week: 2.0 standard drinks of alcohol   Types: 2 Glasses of wine per week   Comment: glass of wine daily    Family History  Problem Relation Age of Onset   Diabetes Mother    Arthritis Father    Cancer Maternal Aunt        liver   Heart disease Paternal Grandfather    Prostate cancer Brother    Colon cancer Neg Hx    Pancreatic cancer Neg Hx    Stomach cancer Neg Hx     Review of Systems  Constitutional:  Negative for chills and fever.  Respiratory:  Negative for cough.   Cardiovascular:  Negative for chest pain.  Gastrointestinal:  Negative for abdominal pain.  Genitourinary:  Negative for dysuria.  Musculoskeletal:  Positive for joint pain.    Objective:  Physical Exam: - Well-developed female, alert and oriented, no apparent distress. - Left knee: No effusion. Range of motion 0 to 125 degrees. Crepitus on range of motion. Tenderness laterally. No medial tenderness or instability. - Right knee: Range of motion 0 to 135 degrees. No tenderness or instability.  Imaging Review Plain radiographs demonstratebone-on-bone arthritis in the patellofemoral compartment and near bone-on-bone arthritis in the medial compartment of the left  knee.  Assessment/Plan:  End stage arthritis, left knee   The patient history, physical examination, clinical judgment of the provider and imaging studies are consistent with end stage degenerative joint disease of the left knee and total knee arthroplasty is deemed medically necessary. The treatment options including medical management, injection therapy arthroscopy and arthroplasty were discussed at length. The risks and benefits of total knee arthroplasty were presented and reviewed. The risks due to aseptic loosening, infection, stiffness, patella tracking problems, thromboembolic complications and other imponderables were discussed. The patient acknowledged the explanation, agreed to proceed with the plan and consent was signed. Patient is being admitted for inpatient treatment for surgery, pain control, PT, OT, prophylactic antibiotics, VTE prophylaxis, progressive ambulation and ADLs and discharge planning. The patient is planning to be discharged home at [facility] independent living.   Patient's anticipated LOS is less than 2 midnights, meeting these requirements: - Lives within 1 hour of care - Has a competent adult at home to recover with post-op recover - NO history of  - Chronic pain requiring opiods  - Diabetes  - Coronary Artery Disease  - Heart failure  - Heart attack  - Stroke  - DVT/VTE  - Cardiac arrhythmia  - Renal failure  - Anemia  - Advanced Liver disease  The[place] Plans[place] [facility] [place]  Dis[place] [facility] [place] Planned DVT Prophylaxis: Xarelto  10mg  QD (hx breast CA)  DME Needed: Ordered w/ CM PCP: Comer Greet, MD (clearance received)  TXA: IV  Allergies: NKDA Metal Allergies: None  Anesthesia Concerns: Trouble with spinal in the past BMI: 22.7  Last HgbA1c: not diabetic  Pharmacy: [facility] Pain regimen: Oxycodone , Tramadol    Other: - [place] at [facility] - Hx breast cancer  - Xrays under [facility] on  06-10-24  - Patient was instructed on what medications to stop prior to surgery. - Follow-up visit in 2 weeks with Dr. Melodi - Begin physical therapy following surgery - Pre-operative lab work as pre-surgical testing - Prescriptions will be provided in hospital at time of discharge  Waddell Sor, PA-C Orthopedic Surgery [facility] [place]      [1] No Known Allergies  "

## 2024-12-12 NOTE — Patient Instructions (Signed)
 SURGICAL WAITING ROOM VISITATION Patients having surgery or a procedure may have no more than 2 support people in the waiting area - these visitors may rotate in the visitor waiting room.   Due to an increase in RSV and influenza rates and associated hospitalizations, children ages 3 and under may not visit patients in [facility] hospitals. If the patient needs to stay at the hospital during part of their recovery, the visitor guidelines for inpatient rooms apply.  PRE-OP VISITATION  Pre-op nurse will coordinate an appropriate time for 1 support person to accompany the patient in pre-op.  This support person may not rotate.  This visitor will be contacted when the time is appropriate for the visitor to come back in the pre-op area.  Please refer to the [facility] website for the visitor guidelines for Inpatients (after your surgery is over and you are in a regular room).  You are not required to quarantine at this time prior to your surgery. However, you must do this: Hand Hygiene often Do NOT share personal items Notify your provider if you are in close contact with someone who has COVID or you develop fever 100.4 or greater, new onset of sneezing, cough, sore throat, shortness of breath or body aches.  If you test positive for Covid or have been in contact with anyone that has tested positive in the last 10 days please notify you surgeon.    Your procedure is scheduled on:  12/29/24  Report to [facility] Main Entrance: [address] entrance where the [employer] is available.   Report to admitting at: 8:45 AM  Call this number if you have any questions or problems the morning of surgery [phone]  FOLLOW ANY ADDITIONAL PRE OP INSTRUCTIONS YOU RECEIVED FROM YOUR SURGEON'S OFFICE!!!  Do not eat food after Midnight the night prior to your surgery/procedure.  After Midnight you may have the following liquids until: 8:00 AM DAY OF SURGERY  Clear Liquid Diet Water  Black  Coffee (sugar ok, NO MILK/CREAM OR CREAMERS)  Tea (sugar ok, NO MILK/CREAM OR CREAMERS) regular and decaf                             Plain Jell-O  with no fruit (NO RED)                                           Fruit ices (not with fruit pulp, NO RED)                                     Popsicles (NO RED)                                                                  Juice: NO CITRUS JUICES: only apple, WHITE grape, WHITE cranberry Sports drinks like Gatorade or Powerade (NO RED)   The day of surgery:  Drink ONE (1) Pre-Surgery Clear Ensure at : 8:00 AM the morning of surgery. Drink in one sitting. Do not sip.  This drink was given  to you during your hospital pre-op appointment visit. Nothing else to drink after completing the Pre-Surgery Clear Ensure or G2 : No candy, chewing gum or throat lozenges.    Oral Hygiene is also important to reduce your risk of infection.        Remember - BRUSH YOUR TEETH THE MORNING OF SURGERY WITH YOUR REGULAR TOOTHPASTE  Do NOT smoke after Midnight the night before surgery.  STOP TAKING all Vitamins, Herbs and supplements 1 week before your surgery.   Take ONLY these medicines the morning of surgery with A SIP OF WATER : gabapentin ,omeprazole .Cetirizine as needed.  If You have been diagnosed with Sleep Apnea - Bring CPAP mask and tubing day of surgery. We will provide you with a CPAP machine on the day of your surgery.                   You may not have any metal on your body including hair pins, jewelry, and body piercing  Do not wear make-up, lotions, powders, perfumes / cologne, or deodorant  Do not wear nail polish including gel and S&S, artificial / acrylic nails, or any other type of covering on natural nails including finger and toenails. If you have artificial nails, gel coating, etc., that needs to be removed by a nail salon, Please have this removed prior to surgery. Not doing so may mean that your surgery could be cancelled or delayed if the  Surgeon or anesthesia staff feels like they are unable to monitor you safely.   Do not shave 48 hours prior to surgery to avoid nicks in your skin which may contribute to postoperative infections.   Contacts, Hearing Aids, dentures or bridgework may not be worn into surgery. DENTURES WILL BE REMOVED PRIOR TO SURGERY PLEASE DO NOT APPLY Poly grip OR ADHESIVES!!!  You may bring a small overnight bag with you on the day of surgery, only pack items that are not valuable. [facility] IS NOT RESPONSIBLE   FOR VALUABLES THAT ARE LOST OR STOLEN.   Patients discharged on the day of surgery will not be allowed to drive home.  Someone NEEDS to stay with you for the first 24 hours after anesthesia.  Do not bring your home medications to the hospital. The Pharmacy will dispense medications listed on your medication list to you during your admission in the Hospital.  Special Instructions: Bring a copy of your healthcare power of attorney and living will documents the day of surgery, if you wish to have them scanned into your [facility] Records- EPIC  Please read over the following fact sheets you were given: IF YOU HAVE QUESTIONS ABOUT YOUR PRE-OP INSTRUCTIONS, PLEASE CALL [phone]  PATIENT SIGNATURE_________________________________  NURSE SIGNATURE__________________________________  ________________________________________________________________________  Pre-operative 4 CHG Bath Instructions  DYNA-Hex 4 Chlorhexidine  Gluconate 4% Solution Antiseptic 4 fl. oz   You can play a key role in reducing the risk of infection after surgery. Your skin needs to be as free of germs as possible. You can reduce the number of germs on your skin by washing with CHG (chlorhexidine  gluconate) soap before surgery. CHG is an antiseptic soap that kills germs and continues to kill germs even after washing.   DO NOT use if you have an allergy to chlorhexidine /CHG or antibacterial soaps. If your skin becomes  reddened or irritated, stop using the CHG and notify one of our RNs at   Please shower with the CHG soap starting 4 days before surgery using the following schedule:  Please keep in mind the following:  DO NOT shave, including legs and underarms, starting the day of your first shower.   You may shave your face at any point before/day of surgery.  Place clean sheets on your bed the day you start using CHG soap. Use a clean washcloth (not used since being washed) for each shower. DO NOT sleep with pets once you start using the CHG.  CHG Shower Instructions:  If you choose to wash your hair and private area, wash first with your normal shampoo/soap.  After you use shampoo/soap, rinse your hair and body thoroughly to remove shampoo/soap residue.  Turn the water  OFF and apply about 3 tablespoons (45 ml) of CHG soap to a CLEAN washcloth.  Apply CHG soap ONLY FROM YOUR NECK DOWN TO YOUR TOES (washing for 3-5 minutes)  DO NOT use CHG soap on face, private areas, open wounds, or sores.  Pay special attention to the area where your surgery is being performed.  If you are having back surgery, having someone wash your back for you may be helpful. Wait 2 minutes after CHG soap is applied, then you may rinse off the CHG soap.  Pat dry with a clean towel  Put on clean clothes/pajamas   If you choose to wear lotion, please use ONLY the CHG-compatible lotions on the back of this paper.     Additional instructions for the day of surgery: DO NOT APPLY any lotions, deodorants, cologne, or perfumes.   Put on clean/comfortable clothes.  Brush your teeth.  Ask your nurse before applying any prescription medications to the skin.   CHG Compatible Lotions   Aveeno Moisturizing lotion  Cetaphil Moisturizing Cream  Cetaphil Moisturizing Lotion  Clairol Herbal Essence Moisturizing Lotion, Dry Skin  Clairol Herbal Essence Moisturizing Lotion, Extra Dry Skin  Clairol Herbal Essence Moisturizing Lotion,  Normal Skin  Curel Age Defying Therapeutic Moisturizing Lotion with Alpha Hydroxy  Curel Extreme Care Body Lotion  Curel Soothing Hands Moisturizing Hand Lotion  Curel Therapeutic Moisturizing Cream, Fragrance-Free  Curel Therapeutic Moisturizing Lotion, Fragrance-Free  Curel Therapeutic Moisturizing Lotion, Original Formula  Eucerin Daily Replenishing Lotion  Eucerin Dry Skin Therapy Plus Alpha Hydroxy Crme  Eucerin Dry Skin Therapy Plus Alpha Hydroxy Lotion  Eucerin Original Crme  Eucerin Original Lotion  Eucerin Plus Crme Eucerin Plus Lotion  Eucerin TriLipid Replenishing Lotion  Keri Anti-Bacterial Hand Lotion  Keri Deep Conditioning Original Lotion Dry Skin Formula Softly Scented  Keri Deep Conditioning Original Lotion, Fragrance Free Sensitive Skin Formula  Keri Lotion Fast Absorbing Fragrance Free Sensitive Skin Formula  Keri Lotion Fast Absorbing Softly Scented Dry Skin Formula  Keri Original Lotion  Keri Skin Renewal Lotion Keri Silky Smooth Lotion  Keri Silky Smooth Sensitive Skin Lotion  Nivea Body Creamy Conditioning Oil  Nivea Body Extra Enriched Lotion  Nivea Body Original Lotion  Nivea Body Sheer Moisturizing Lotion Nivea Crme  Nivea Skin Firming Lotion  NutraDerm 30 Skin Lotion  NutraDerm Skin Lotion  NutraDerm Therapeutic Skin Cream  NutraDerm Therapeutic Skin Lotion  ProShield Protective Hand Cream  Provon moisturizing lotion  Incentive Spirometer  An incentive spirometer is a tool that can help keep your lungs clear and active. This tool measures how well you are filling your lungs with each breath. Taking long deep breaths may help reverse or decrease the chance of developing breathing (pulmonary) problems (especially infection) following: A long period of time when you are unable to move or be active. BEFORE THE PROCEDURE  If  the spirometer includes an indicator to show your best effort, your nurse or respiratory therapist will set it to a desired  goal. If possible, sit up straight or lean slightly forward. Try not to slouch. Hold the incentive spirometer in an upright position. INSTRUCTIONS FOR USE  Sit on the edge of your bed if possible, or sit up as far as you can in bed or on a chair. Hold the incentive spirometer in an upright position. Breathe out normally. Place the mouthpiece in your mouth and seal your lips tightly around it. Breathe in slowly and as deeply as possible, raising the piston or the ball toward the top of the column. Hold your breath for 3-5 seconds or for as long as possible. Allow the piston or ball to fall to the bottom of the column. Remove the mouthpiece from your mouth and breathe out normally. Rest for a few seconds and repeat Steps 1 through 7 at least 10 times every 1-2 hours when you are awake. Take your time and take a few normal breaths between deep breaths. The spirometer may include an indicator to show your best effort. Use the indicator as a goal to work toward during each repetition. After each set of 10 deep breaths, practice coughing to be sure your lungs are clear. If you have an incision (the cut made at the time of surgery), support your incision when coughing by placing a pillow or rolled up towels firmly against it. Once you are able to get out of bed, walk around indoors and cough well. You may stop using the incentive spirometer when instructed by your caregiver.  RISKS AND COMPLICATIONS Take your time so you do not get dizzy or light-headed. If you are in pain, you may need to take or ask for pain medication before doing incentive spirometry. It is harder to take a deep breath if you are having pain. AFTER USE Rest and breathe slowly and easily. It can be helpful to keep track of a log of your progress. Your caregiver can provide you with a simple table to help with this. If you are using the spirometer at home, follow these instructions: SEEK MEDICAL CARE IF:  You are having difficultly  using the spirometer. You have trouble using the spirometer as often as instructed. Your pain medication is not giving enough relief while using the spirometer. You develop fever of 100.5 F (38.1 C) or higher. SEEK IMMEDIATE MEDICAL CARE IF:  You cough up bloody sputum that had not been present before. You develop fever of 102 F (38.9 C) or greater. You develop worsening pain at or near the incision site. MAKE SURE YOU:  Understand these instructions. Will watch your condition. Will get help right away if you are not doing well or get worse. Document Released: 03/19/2007 Document Revised: 01/29/2012 Document Reviewed: 05/20/2007 [facility] Patient Information 2014 [place], [place].   ________________________________________________________________________

## 2024-12-16 ENCOUNTER — Encounter (HOSPITAL_COMMUNITY): Payer: Self-pay

## 2024-12-16 ENCOUNTER — Encounter (HOSPITAL_COMMUNITY)
Admission: RE | Admit: 2024-12-16 | Discharge: 2024-12-16 | Disposition: A | Discharge status: Home / Self Care | Source: Ambulatory Visit | Attending: Orthopedic Surgery

## 2024-12-16 ENCOUNTER — Other Ambulatory Visit: Payer: Self-pay

## 2024-12-16 VITALS — Ht 66.5 in | Wt 140.0 lb

## 2024-12-16 DIAGNOSIS — Z01818 Encounter for other preprocedural examination: Secondary | ICD-10-CM

## 2024-12-16 NOTE — Progress Notes (Signed)
 For Anesthesia: PCP - Mahlon Comer BRAVO, MD  Cardiologist - N/A  Bowel Prep reminder:  Chest x-ray -  EKG - 12/12/24 Stress Test -  ECHO -  Cardiac Cath -  Pacemaker/ICD device last checked: Pacemaker orders received: Device Rep notified:  Spinal Cord Stimulator:Tabori, Comer BRAVO, MD   Sleep Study - Yes CPAP - NO  Fasting Blood Sugar - N/A Checks Blood Sugar _____ times a day Date and result of last Hgb A1c-  Last dose of GLP1 agonist- N/A GLP1 instructions: Hold 7 days prior to schedule (Hold 24 hours-daily)   Last dose of SGLT-2 inhibitors- N/A SGLT-2 instructions: Hold 72 hours prior to surgery  Blood Thinner Instructions:N/A Last Dose: Time last taken:  Aspirin Instructions:N/A Last Dose: Time last taken:  Activity level: Can go up a flight of stairs and activities of daily living without stopping and without chest pain and/or shortness of breath   Able to exercise without chest pain and/or shortness of breath  Anesthesia review:   Patient denies shortness of breath, fever, cough and chest pain at PAT appointment   Patient verbalized understanding of instructions that were reviewed over the telephone.

## 2024-12-17 ENCOUNTER — Other Ambulatory Visit (HOSPITAL_BASED_OUTPATIENT_CLINIC_OR_DEPARTMENT_OTHER): Payer: Self-pay

## 2024-12-17 MED ORDER — ADACEL 5-2-15.5 LF-MCG/0.5 IM SUSY
PREFILLED_SYRINGE | INTRAMUSCULAR | 0 refills | Status: AC
  Filled 2024-12-17: qty 0.5, 1d supply, fill #0

## 2024-12-22 ENCOUNTER — Encounter: Payer: Self-pay | Admitting: Family Medicine

## 2024-12-23 ENCOUNTER — Encounter (HOSPITAL_COMMUNITY)
Admission: RE | Admit: 2024-12-23 | Discharge: 2024-12-23 | Disposition: A | Source: Ambulatory Visit | Attending: Orthopedic Surgery

## 2024-12-23 DIAGNOSIS — Z01818 Encounter for other preprocedural examination: Secondary | ICD-10-CM | POA: Insufficient documentation

## 2024-12-23 LAB — CBC
HCT: 36.6 % (ref 36.0–46.0)
Hemoglobin: 12.1 g/dL (ref 12.0–15.0)
MCH: 32.7 pg (ref 26.0–34.0)
MCHC: 33.1 g/dL (ref 30.0–36.0)
MCV: 98.9 fL (ref 80.0–100.0)
Platelets: 260 10*3/uL (ref 150–400)
RBC: 3.7 MIL/uL — ABNORMAL LOW (ref 3.87–5.11)
RDW: 12.1 % (ref 11.5–15.5)
WBC: 5.5 10*3/uL (ref 4.0–10.5)
nRBC: 0 % (ref 0.0–0.2)

## 2024-12-23 LAB — SURGICAL PCR SCREEN
MRSA, PCR: NEGATIVE
Staphylococcus aureus: NEGATIVE

## 2024-12-24 NOTE — Anesthesia Preprocedure Evaluation (Signed)
 "                                  Anesthesia Evaluation    Airway       Comment: Previous grade I view with Miller 3, easy mask Dental   Pulmonary sleep apnea (uses oral device) , former smoker          Cardiovascular      Neuro/Psych S/p lumbar spinal fusion    GI/Hepatic ,GERD  Medicated,,  Endo/Other    Renal/GU      Musculoskeletal  (+) Arthritis , Osteoarthritis,    Abdominal   Peds  Hematology Lab Results      Component                Value               Date                      WBC                      5.5                 12/23/2024                HGB                      12.1                12/23/2024                HCT                      36.6                12/23/2024                MCV                      98.9                12/23/2024                PLT                      260                 12/23/2024              Anesthesia Other Findings 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 Attempts X2 paramedian unsuccessful L3/L4, L5/S1. 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

## 2024-12-29 ENCOUNTER — Ambulatory Visit (HOSPITAL_COMMUNITY): Admission: RE | Admit: 2024-12-29 | Payer: Self-pay | Source: Home / Self Care | Admitting: Orthopedic Surgery

## 2024-12-29 ENCOUNTER — Encounter (HOSPITAL_COMMUNITY): Payer: Self-pay | Admitting: Anesthesiology

## 2024-12-29 ENCOUNTER — Encounter (HOSPITAL_COMMUNITY): Payer: Self-pay | Admitting: Medical

## 2024-12-29 ENCOUNTER — Encounter: Admission: RE | Payer: Self-pay | Source: Home / Self Care

## 2024-12-29 SURGERY — ARTHROPLASTY, KNEE, TOTAL
Anesthesia: Choice | Site: Knee | Laterality: Left

## 2025-09-18 ENCOUNTER — Encounter: Admitting: Family Medicine

## 2025-10-22 ENCOUNTER — Encounter: Admitting: Family Medicine

## 2025-10-22 ENCOUNTER — Ambulatory Visit
